# Patient Record
Sex: Female | Born: 1940 | Race: White | Hispanic: No | Marital: Married | Smoking: Never smoker
Health system: Southern US, Community
[De-identification: ages and names within clinical notes are randomized; demographics above are authoritative.]

## PROBLEM LIST (undated history)

## (undated) DIAGNOSIS — F329 Major depressive disorder, single episode, unspecified: Secondary | ICD-10-CM

## (undated) DIAGNOSIS — Z9889 Other specified postprocedural states: Secondary | ICD-10-CM

## (undated) DIAGNOSIS — F419 Anxiety disorder, unspecified: Secondary | ICD-10-CM

## (undated) DIAGNOSIS — K5790 Diverticulosis of intestine, part unspecified, without perforation or abscess without bleeding: Secondary | ICD-10-CM

## (undated) DIAGNOSIS — M549 Dorsalgia, unspecified: Secondary | ICD-10-CM

## (undated) DIAGNOSIS — M797 Fibromyalgia: Secondary | ICD-10-CM

## (undated) DIAGNOSIS — R739 Hyperglycemia, unspecified: Secondary | ICD-10-CM

## (undated) DIAGNOSIS — R519 Headache, unspecified: Secondary | ICD-10-CM

## (undated) DIAGNOSIS — K760 Fatty (change of) liver, not elsewhere classified: Secondary | ICD-10-CM

## (undated) DIAGNOSIS — E039 Hypothyroidism, unspecified: Secondary | ICD-10-CM

## (undated) DIAGNOSIS — I10 Essential (primary) hypertension: Secondary | ICD-10-CM

## (undated) DIAGNOSIS — R7303 Prediabetes: Secondary | ICD-10-CM

## (undated) DIAGNOSIS — F32A Depression, unspecified: Secondary | ICD-10-CM

## (undated) DIAGNOSIS — G8929 Other chronic pain: Secondary | ICD-10-CM

## (undated) DIAGNOSIS — R51 Headache: Secondary | ICD-10-CM

## (undated) DIAGNOSIS — K219 Gastro-esophageal reflux disease without esophagitis: Secondary | ICD-10-CM

## (undated) DIAGNOSIS — M1990 Unspecified osteoarthritis, unspecified site: Secondary | ICD-10-CM

## (undated) HISTORY — PX: BREAST BIOPSY: SHX20

## (undated) HISTORY — DX: Other specified postprocedural states: Z98.890

## (undated) HISTORY — DX: Diverticulosis of intestine, part unspecified, without perforation or abscess without bleeding: K57.90

## (undated) HISTORY — DX: Other chronic pain: G89.29

## (undated) HISTORY — DX: Fibromyalgia: M79.7

## (undated) HISTORY — DX: Hyperglycemia, unspecified: R73.9

## (undated) HISTORY — PX: APPENDECTOMY: SHX54

## (undated) HISTORY — PX: OTHER SURGICAL HISTORY: SHX169

## (undated) HISTORY — PX: TOTAL ABDOMINAL HYSTERECTOMY W/ BILATERAL SALPINGOOPHORECTOMY: SHX83

## (undated) HISTORY — DX: Hypothyroidism, unspecified: E03.9

## (undated) HISTORY — DX: Gastro-esophageal reflux disease without esophagitis: K21.9

## (undated) HISTORY — PX: LUMBAR DISC SURGERY: SHX700

## (undated) HISTORY — DX: Fatty (change of) liver, not elsewhere classified: K76.0

## (undated) HISTORY — DX: Dorsalgia, unspecified: M54.9

---

## 1996-03-23 HISTORY — PX: CHOLECYSTECTOMY: SHX55

## 1999-12-16 ENCOUNTER — Other Ambulatory Visit: Admission: RE | Admit: 1999-12-16 | Discharge: 1999-12-16 | Payer: Self-pay | Admitting: Gynecology

## 2001-04-11 ENCOUNTER — Encounter: Payer: Self-pay | Admitting: Rheumatology

## 2001-04-11 ENCOUNTER — Ambulatory Visit (HOSPITAL_COMMUNITY): Admission: RE | Admit: 2001-04-11 | Discharge: 2001-04-11 | Payer: Self-pay | Admitting: Rheumatology

## 2002-04-27 ENCOUNTER — Other Ambulatory Visit: Admission: RE | Admit: 2002-04-27 | Discharge: 2002-04-27 | Payer: Self-pay | Admitting: Gynecology

## 2003-05-19 ENCOUNTER — Encounter: Admission: RE | Admit: 2003-05-19 | Discharge: 2003-05-19 | Payer: Self-pay | Admitting: Neurology

## 2004-02-25 ENCOUNTER — Ambulatory Visit (HOSPITAL_COMMUNITY): Admission: RE | Admit: 2004-02-25 | Discharge: 2004-02-25 | Payer: Self-pay | Admitting: General Surgery

## 2004-03-23 DIAGNOSIS — Z9889 Other specified postprocedural states: Secondary | ICD-10-CM

## 2004-03-23 DIAGNOSIS — K219 Gastro-esophageal reflux disease without esophagitis: Secondary | ICD-10-CM

## 2004-03-23 HISTORY — DX: Gastro-esophageal reflux disease without esophagitis: K21.9

## 2004-03-23 HISTORY — DX: Other specified postprocedural states: Z98.890

## 2004-04-22 ENCOUNTER — Encounter (HOSPITAL_COMMUNITY): Admission: RE | Admit: 2004-04-22 | Discharge: 2004-05-22 | Payer: Self-pay | Admitting: Neurology

## 2004-05-27 ENCOUNTER — Encounter (HOSPITAL_COMMUNITY): Admission: RE | Admit: 2004-05-27 | Discharge: 2004-06-26 | Payer: Self-pay | Admitting: Neurology

## 2004-12-05 ENCOUNTER — Encounter (HOSPITAL_COMMUNITY): Admission: RE | Admit: 2004-12-05 | Discharge: 2004-12-19 | Payer: Self-pay | Admitting: Neurology

## 2006-06-27 ENCOUNTER — Emergency Department (HOSPITAL_COMMUNITY): Admission: EM | Admit: 2006-06-27 | Discharge: 2006-06-28 | Payer: Self-pay | Admitting: Emergency Medicine

## 2006-06-28 ENCOUNTER — Ambulatory Visit (HOSPITAL_COMMUNITY): Admission: RE | Admit: 2006-06-28 | Discharge: 2006-06-28 | Payer: Self-pay | Admitting: Family Medicine

## 2006-07-08 ENCOUNTER — Ambulatory Visit (HOSPITAL_COMMUNITY): Admission: RE | Admit: 2006-07-08 | Discharge: 2006-07-08 | Payer: Self-pay | Admitting: Neurology

## 2006-08-09 ENCOUNTER — Encounter: Admission: RE | Admit: 2006-08-09 | Discharge: 2006-08-09 | Payer: Self-pay | Admitting: Neurology

## 2006-12-06 ENCOUNTER — Ambulatory Visit (HOSPITAL_COMMUNITY): Admission: RE | Admit: 2006-12-06 | Discharge: 2006-12-06 | Payer: Self-pay | Admitting: Family Medicine

## 2007-02-03 ENCOUNTER — Ambulatory Visit: Payer: Self-pay | Admitting: Internal Medicine

## 2007-02-10 ENCOUNTER — Ambulatory Visit (HOSPITAL_COMMUNITY): Admission: RE | Admit: 2007-02-10 | Discharge: 2007-02-10 | Payer: Self-pay | Admitting: Internal Medicine

## 2007-03-31 ENCOUNTER — Ambulatory Visit: Payer: Self-pay | Admitting: Internal Medicine

## 2007-09-30 ENCOUNTER — Ambulatory Visit: Payer: Self-pay | Admitting: Gastroenterology

## 2007-12-09 ENCOUNTER — Ambulatory Visit (HOSPITAL_COMMUNITY): Admission: RE | Admit: 2007-12-09 | Discharge: 2007-12-09 | Payer: Self-pay | Admitting: Family Medicine

## 2008-02-24 ENCOUNTER — Other Ambulatory Visit: Payer: Self-pay | Admitting: Emergency Medicine

## 2008-02-25 ENCOUNTER — Ambulatory Visit: Payer: Self-pay | Admitting: *Deleted

## 2008-02-25 ENCOUNTER — Inpatient Hospital Stay (HOSPITAL_COMMUNITY): Admission: EM | Admit: 2008-02-25 | Discharge: 2008-02-25 | Payer: Self-pay | Admitting: *Deleted

## 2008-03-07 ENCOUNTER — Ambulatory Visit (HOSPITAL_COMMUNITY): Payer: Self-pay | Admitting: Psychiatry

## 2008-03-26 ENCOUNTER — Ambulatory Visit (HOSPITAL_COMMUNITY): Payer: Self-pay | Admitting: Psychiatry

## 2008-03-26 ENCOUNTER — Encounter: Payer: Self-pay | Admitting: Urgent Care

## 2008-03-26 LAB — CONVERTED CEMR LAB
ALT: 30 units/L (ref 0–35)
AST: 35 units/L (ref 0–37)
Albumin: 4.3 g/dL (ref 3.5–5.2)
Alkaline Phosphatase: 74 units/L (ref 39–117)
Bilirubin, Direct: <0.1 mg/dL (ref 0.0–0.3)
Total Bilirubin: 0.4 mg/dL (ref 0.3–1.2)
Total Protein: 7.8 g/dL (ref 6.0–8.3)

## 2008-04-09 ENCOUNTER — Ambulatory Visit (HOSPITAL_COMMUNITY): Payer: Self-pay | Admitting: Psychiatry

## 2008-05-04 ENCOUNTER — Ambulatory Visit (HOSPITAL_COMMUNITY): Payer: Self-pay | Admitting: Psychiatry

## 2008-05-22 ENCOUNTER — Ambulatory Visit (HOSPITAL_COMMUNITY): Payer: Self-pay | Admitting: Psychiatry

## 2008-05-29 ENCOUNTER — Ambulatory Visit (HOSPITAL_COMMUNITY): Payer: Self-pay | Admitting: Psychiatry

## 2008-06-25 ENCOUNTER — Ambulatory Visit (HOSPITAL_COMMUNITY): Payer: Self-pay | Admitting: Psychiatry

## 2008-07-23 ENCOUNTER — Ambulatory Visit (HOSPITAL_COMMUNITY): Payer: Self-pay | Admitting: Psychiatry

## 2008-11-15 DIAGNOSIS — E039 Hypothyroidism, unspecified: Secondary | ICD-10-CM | POA: Insufficient documentation

## 2008-11-15 DIAGNOSIS — M797 Fibromyalgia: Secondary | ICD-10-CM | POA: Insufficient documentation

## 2008-11-15 DIAGNOSIS — R5382 Chronic fatigue, unspecified: Secondary | ICD-10-CM | POA: Insufficient documentation

## 2008-11-15 DIAGNOSIS — Z8719 Personal history of other diseases of the digestive system: Secondary | ICD-10-CM | POA: Insufficient documentation

## 2008-11-15 DIAGNOSIS — E669 Obesity, unspecified: Secondary | ICD-10-CM | POA: Insufficient documentation

## 2008-11-16 ENCOUNTER — Ambulatory Visit: Payer: Self-pay | Admitting: Internal Medicine

## 2008-11-19 DIAGNOSIS — K7689 Other specified diseases of liver: Secondary | ICD-10-CM | POA: Insufficient documentation

## 2008-11-19 DIAGNOSIS — K219 Gastro-esophageal reflux disease without esophagitis: Secondary | ICD-10-CM | POA: Insufficient documentation

## 2008-12-10 ENCOUNTER — Ambulatory Visit (HOSPITAL_COMMUNITY): Admission: RE | Admit: 2008-12-10 | Discharge: 2008-12-10 | Payer: Self-pay | Admitting: Family Medicine

## 2009-01-15 ENCOUNTER — Encounter: Payer: Self-pay | Admitting: Internal Medicine

## 2009-02-07 ENCOUNTER — Ambulatory Visit (HOSPITAL_COMMUNITY): Admission: RE | Admit: 2009-02-07 | Discharge: 2009-02-07 | Payer: Self-pay | Admitting: Internal Medicine

## 2009-12-26 ENCOUNTER — Ambulatory Visit (HOSPITAL_COMMUNITY): Admission: RE | Admit: 2009-12-26 | Discharge: 2009-12-26 | Payer: Self-pay | Admitting: Family Medicine

## 2010-04-22 NOTE — Miscellaneous (Signed)
Summary: Orders Update  Clinical Lists Changes  Orders: Added new Test order of T-Hepatic Function [id number]) - Signed  Appended Document: Orders Update Lab order mailed to pt.

## 2010-04-22 NOTE — Assessment & Plan Note (Signed)
Summary: ANNUAL F/U OV IN JULY 2010,STEATOHEPATITIS/AMS   Visit Type:  Follow-up Visit Primary Care [name]:  golding  Chief Complaint:  yearly follow up.  History of Present Illness: Followup elevated transaminases fatty-appearing liver on ultrasound. History gastroesophageal reflux disease. Last seen here in July 2009 which time she was doing very well her LFTs have subsequently normalized the setting of significant intentional weight loss she would 186 pounds in 2008, 171 pounds in 2009 and today she weighs 156 pounds.  We noted she underwent an EGD and colonoscopy Dr. Lovell Sheehan in 2006 without significant findings. She is due for routine screening in 2016  She does not consume alcohol interestingly, she has a history of a positive hepatitis C antibody as does her daughter however [name] has a negative PCR. She has not had any intercurrent medical illnesses since last visiting the clinic. Hepatic profile from March 27, 2000 was completely normal. She relates to not eating very much aerobic exercise these days because of fibromyalgia.  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 At Bedtime 2)  Aspirin 81 Mg Tabs (Aspirin) .... Once Daily 3)  Lyrica 200 Mg Caps (Pregabalin) .... Three Times A Day 4)   Citalopram Hydrobromide 40 Mg Tabs (Citalopram Hydrobromide) .... Once Daily 5)  Hydrochlorothiazide 25 Mg Tabs (Hydrochlorothiazide) .... Once Daily 6)  Levoxyl 88 Mcg Tabs (Levothyroxine Sodium) .... Once Daily 7)  Centrum Silver  Tabs (Multiple Vitamins-Minerals) .... Once Daily 8)  Lidoderm Patches .... As Needed 9)  Calcium 600 + D .... Once Daily 10)  Prilosec Otc 20 Mg Tbec (Omeprazole Magnesium) .... As Directed 11)  Wellbutrin Xl 150 Mg Xr24h-Tab (Bupropion Hcl) .... Once Daily  Allergies (verified): 1)  ! Sulfa  Past History:  Family History: Last updated: 28-Nov-2008 Father: deceased cancer Mother:deceased cva  Siblings: 3 brothers deceased, 1 living brother,2 sisters No FH of Colon Cancer:  Social History: Last updated: 11/28/2008 Marital Status: Married Children: 3 Occupation: retired Patient has never smoked.  Alcohol Use - no  Risk Factors: Smoking Status: never (November 28, 2008)  Past Medical History: negative colonoscopy and EGD by Dr. Lovell Sheehan- 2006  Past Surgical History: HYSTERECTOMY CHOLECYSTECTOMY 1998 POSTPARTUM HEMORRHAGE,REQUIRING TRANSFUSION HISTORY OF DISK SURGERY HISTORY OF BILATERL CATARACTS APPENDECTOMY  Family History: Father: deceased cancer Mother:deceased cva  Siblings: 3 brothers deceased, 1 living brother,2 sisters No FH of Colon Cancer:  Social History: Marital Status: Married Children: 3 Occupation: retired Patient has never smoked.  Alcohol Use - no Smoking Status:  never  Vital Signs:  Patient profile:   70 year old female Height:      63 inches Weight:      156 pounds BMI:     27.73 Temp:     98.3 degrees F oral Pulse rate:   76 / minute BP sitting:   112 /  72  (left arm) Cuff size:   regular  Vitals Entered By: Hendricks Limes LPN (November 16, 2008 2:39 PM)  Physical Exam  General:  alert conversant no acute distress Eyes:  no sclera icterus conjunctiva P. Abdomen:  nondistended positive bowel sounds soft and  nontender appreciable mass or hepatosplenomegaly  Impression & Recommendations: History of a fatty-appearing liver on ultrasound with mild transaminitis in the past with intentional weight loss her transaminases have normalized. I suspect she has not noticed any liver disease which is becoming quiscent sent with normalization with weight loss.. In addition, her gastroesophageal reflux disease symptoms are now well controlled on Prilosec and I suspect weight loss no doubt has also contributed to improvement in his disease as well.    Recommendations: Continue weight loss/diet aerobic exercise 30 minutes 3 times weekly to optimize her regimen recommended. We'll repeat her hepatic profile and next couple months in conjunction with Dr. Beatrice Lecher requested labs. If her hepatic profile remains normal, would recommend no further specific GI evaluation.  I'll be happy to see her back on a p.r.n. basis.  Appended Document: Orders Update-charge    Clinical Lists Changes  Problems: Added new problem of GERD (ICD-530.81) Added new problem of FATTY LIVER DISEASE (ICD-571.8) Orders: Added new Service order of Est. Patient Level IV (04540) - Signed

## 2010-07-31 ENCOUNTER — Ambulatory Visit (INDEPENDENT_AMBULATORY_CARE_PROVIDER_SITE_OTHER): Payer: Medicare Other | Admitting: Urgent Care

## 2010-07-31 ENCOUNTER — Encounter: Payer: Self-pay | Admitting: Urgent Care

## 2010-07-31 ENCOUNTER — Ambulatory Visit (HOSPITAL_COMMUNITY)
Admission: RE | Admit: 2010-07-31 | Discharge: 2010-07-31 | Disposition: A | Discharge status: Home / Self Care | Payer: Medicare Other | Source: Ambulatory Visit | Attending: Urgent Care | Admitting: Urgent Care

## 2010-07-31 DIAGNOSIS — K219 Gastro-esophageal reflux disease without esophagitis: Secondary | ICD-10-CM

## 2010-07-31 DIAGNOSIS — R109 Unspecified abdominal pain: Secondary | ICD-10-CM | POA: Insufficient documentation

## 2010-07-31 DIAGNOSIS — R197 Diarrhea, unspecified: Secondary | ICD-10-CM

## 2010-07-31 DIAGNOSIS — K5730 Diverticulosis of large intestine without perforation or abscess without bleeding: Secondary | ICD-10-CM | POA: Insufficient documentation

## 2010-07-31 DIAGNOSIS — K7689 Other specified diseases of liver: Secondary | ICD-10-CM | POA: Insufficient documentation

## 2010-07-31 LAB — COMPREHENSIVE METABOLIC PANEL
ALT: 52 U/L — ABNORMAL HIGH (ref 0–35)
AST: 52 U/L — ABNORMAL HIGH (ref 0–37)
Albumin: 4.1 g/dL (ref 3.5–5.2)
Alkaline Phosphatase: 66 U/L (ref 39–117)
BUN: 14 mg/dL (ref 6–23)
CO2: 33 mEq/L — ABNORMAL HIGH (ref 19–32)
Calcium: 10.6 mg/dL — ABNORMAL HIGH (ref 8.4–10.5)
Chloride: 98 mEq/L (ref 96–112)
Creat: 0.62 mg/dL (ref 0.40–1.20)
Glucose, Bld: 94 mg/dL (ref 70–99)
Potassium: 4.6 mEq/L (ref 3.5–5.3)
Sodium: 137 mEq/L (ref 135–145)
Total Bilirubin: 0.3 mg/dL (ref 0.3–1.2)
Total Protein: 7.7 g/dL (ref 6.0–8.3)

## 2010-07-31 LAB — CBC WITH DIFFERENTIAL/PLATELET
Basophils Absolute: 0 10*3/uL (ref 0.0–0.1)
Basophils Relative: 0 % (ref 0–1)
Eosinophils Absolute: 0 10*3/uL (ref 0.0–0.7)
Eosinophils Relative: 0 % (ref 0–5)
HCT: 40.5 % (ref 36.0–46.0)
Hemoglobin: 14.1 g/dL (ref 12.0–15.0)
Lymphocytes Relative: 43 % (ref 12–46)
Lymphs Abs: 3.4 10*3/uL (ref 0.7–4.0)
MCH: 32.9 pg (ref 26.0–34.0)
MCHC: 34.8 g/dL (ref 30.0–36.0)
MCV: 94.4 fL (ref 78.0–100.0)
Monocytes Absolute: 0.7 10*3/uL (ref 0.1–1.0)
Monocytes Relative: 9 % (ref 3–12)
Neutro Abs: 3.8 10*3/uL (ref 1.7–7.7)
Neutrophils Relative %: 48 % (ref 43–77)
Platelets: 317 10*3/uL (ref 150–400)
RBC: 4.29 MIL/uL (ref 3.87–5.11)
RDW: 12.8 % (ref 11.5–15.5)
WBC: 7.9 10*3/uL (ref 4.0–10.5)

## 2010-07-31 LAB — LIPASE: Lipase: 39 U/L (ref 11–59)

## 2010-07-31 MED ORDER — IOHEXOL 300 MG/ML  SOLN
100.0000 mL | Freq: Once | INTRAMUSCULAR | Status: AC | PRN
  Administered 2010-07-31: 100 mL via INTRAVENOUS

## 2010-07-31 NOTE — Progress Notes (Signed)
Primary Care Physician:  Colette Ribas, MD Primary Gastroenterologist:  Dr. Jena Gauss  Chief Complaint  Patient presents with  . Abdominal Pain    for week very tender    HPI:  [name] is a 70 y.o. female here for evaluation of abdominal pain & diarrhea.  C/o six week hx of upper abd pain, radiates to lower abd.  Pain waxes & wanes.  C/o constant soreness.  Intermittent diarrhea 3/7 days per week.  3 stools/day sometimes watery.  Denies rectal bleeding or melena.   Problems started after antibiotics for UTI 1st March.  Eating yogurt.  Denies fever or chills.  C/o nausea or vomiting.  Taking prilosec 20mg  daily, but having regurgitation & water brash at night.  C/o indigestion, burning & aching pain.  Not associated w/ certain foods.  Pain 5/6.  Tried lactaid, no help.  Avoiding dairy.  Denies foreign travel.  Well water.  No ill contacts.  Denies any dysphagia or odynophagia.  Rare aleve.  Worse w/ lying supine.  Worse after eating.    Past Medical History  Diagnosis Date  . Hypothyroid   . Hyperglycemia   . Diverticulosis   . GERD (gastroesophageal reflux disease) 2006    EGD by Dr. Lovell Sheehan  . Fibromyalgia   . S/P colonoscopy 2006    Dr. Lovell Sheehan  . Fatty liver   . Hepatitis C antibody test positive     negative pcr  . Chronic back pain   . Hemorrhage after delivery of fetus     Past Surgical History  Procedure Date  . Cholecystectomy 1998  . Total abdominal hysterectomy w/ bilateral salpingoophorectomy   . Cataracts   . Lumbar disc surgery   . Appendectomy     Current Outpatient Prescriptions  Medication Sig Dispense Refill  . aspirin 81 MG tablet Take 81 mg by mouth daily.        . Calcium-Vitamin D-Vitamin K (CALCIUM + D + K PO) Take 500 mg by mouth.        . citalopram (CELEXA) 20 MG tablet Take 20 mg by mouth daily.        . fish oil-omega-3 fatty acids 1000 MG capsule Take 2 g by mouth daily.        . hydrochlorothiazide 25 MG tablet Take 25 mg by mouth daily.         Marland Kitchen levothyroxine (SYNTHROID, LEVOTHROID) 88 MCG tablet Take 88 mcg by mouth daily.        Marland Kitchen lidocaine (LIDODERM) 5 % Place 1 patch onto the skin daily. Remove & Discard patch within 12 hours or as directed by MD       . Milnacipran HCl (SAVELLA) 25 MG TABS Take by mouth.        . Multiple Vitamin (MULTIVITAMIN) capsule Take 1 capsule by mouth daily.        . Nutritional Supplements (ESTROVEN PO) Take by mouth.        Marland Kitchen omeprazole (PRILOSEC) 20 MG capsule Take 20 mg by mouth daily.        . pregabalin (LYRICA) 200 MG capsule Take 200 mg by mouth 2 (two) times daily.        Marland Kitchen zolpidem (AMBIEN CR) 12.5 MG CR tablet Take 12.5 mg by mouth at bedtime as needed.          Allergies as of 07/31/2010 - Review Complete 07/31/2010  Allergen Reaction Noted  . Sulfonamide derivatives      Family History: There is  no known family history of colorectal carcinoma , liver disease, or inflammatory bowel disease.  Problem Relation Age of Onset  . Brain cancer Father   . Coronary artery disease Mother   . Prostate cancer Brother   . Cancer Sister     gyn  . Lung cancer Brother   . Seizures Son   . Pancreatitis Daughter     ?gallstones    History   Social History  . Marital Status: Married    Spouse Name: N/A    Number of Children: 3  . Years of Education: N/A   Occupational History  . retired     [employer]   Social History Main Topics  . Smoking status: Never Smoker   . Smokeless tobacco: Not on file  . Alcohol Use: No  . Drug Use: No  . Sexually Active: No   Review of Systems: Gen: some fatigue, anorexia. CV: Denies chest pain, angina, palpitations, syncope, orthopnea, PND, peripheral edema, and claudication. Resp: Denies dyspnea at rest, dyspnea with exercise, cough, sputum, wheezing, coughing up blood, and pleurisy. GI: Denies vomiting blood, jaundice, and fecal incontinence.   Denies dysphagia or odynophagia. GU : Denies urinary burning, blood in urine,  urinary frequency, urinary hesitancy, nocturnal urination, and urinary incontinence. MS: Denies joint pain, limitation of movement, and swelling, stiffness, low back pain, extremity pain. Denies muscle weakness, cramps, atrophy.  Derm: Denies rash, itching, dry skin, hives, moles, warts, or unhealing ulcers.  Psych: Denies depression, anxiety, memory loss, suicidal ideation, hallucinations, paranoia, and confusion. Heme: Denies bruising, bleeding, and enlarged lymph nodes.  Physical Exam: BP 118/80  Pulse 88  Temp(Src) 97.6 F (36.4 C) (Tympanic)  Ht 5\' 2"  (1.575 m)  Wt 167 lb 9.6 oz (76.023 kg)  BMI 30.65 kg/m2 General:   Alert,  Well-developed, well-nourished, pleasant and cooperative in NAD Head:  Normocephalic and atraumatic. Eyes:  Sclera clear, no icterus.   Conjunctiva pink. Ears:  Normal auditory acuity. Nose:  No deformity, discharge,  or lesions. Mouth:  No deformity or lesions, dentition normal. Neck:  Supple; no masses or thyromegaly. Lungs:  Clear throughout to auscultation.   No wheezes, crackles, or rhonchi. No acute distress. Heart:  Regular rate and rhythm; no murmurs, clicks, rubs,  or gallops. Abdomen:  Soft, mildly distended.  Generalized abd pain, most tender to epigastrum, umbilicus & bilat lower quadrants.  No masses, hepatosplenomegaly or hernias noted. Normal bowel sounds, without guarding, and without rebound.   Msk:  Symmetrical without gross deformities. Normal posture. Pulses:  Normal pulses noted. Extremities:  Without clubbing or edema. Neurologic:  Alert and  oriented x4;  grossly normal neurologically. Skin:  Intact without significant lesions or rashes. Cervical Nodes:  No significant cervical adenopathy. Psych:  Alert and cooperative. Normal mood and affect.

## 2010-07-31 NOTE — Assessment & Plan Note (Signed)
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

## 2010-07-31 NOTE — Assessment & Plan Note (Addendum)
6-week history of constant waxing & waning abd pain.  Begins in upper abd, but radiates through entire abd.   She has had diarrhea after antibiotics as well.  Given severity of pain will request CT abd/pelvis w/IV/oral contrast to r/o diverticulitis, colitis, pancreatitis.  Check stool for c diff by pcr, culture STAT labs including CBC, CMP, lipase TO ER if severe pain Offered pain meds, patient declined

## 2010-07-31 NOTE — Progress Notes (Signed)
Cc to PCP 

## 2010-07-31 NOTE — Assessment & Plan Note (Addendum)
Chronic on omeprazole 20mg  daily with breakthrough symptoms especially nocturnal. Increase omeprazole to 20mg  BID Begin ALIGN daily

## 2010-07-31 NOTE — Assessment & Plan Note (Signed)
Given recent antibiotic use, we need to r/o c diff or infectious colitis.

## 2010-07-31 NOTE — Patient Instructions (Addendum)
To ER if severe pain Begin ALIGN daily Increase omeprazole 20mg  to twice per day

## 2010-08-04 ENCOUNTER — Telehealth: Payer: Self-pay | Admitting: Urgent Care

## 2010-08-04 ENCOUNTER — Encounter: Payer: Self-pay | Admitting: Internal Medicine

## 2010-08-04 LAB — CLOSTRIDIUM DIFFICILE BY PCR: Toxigenic C. Difficile by PCR: NOT DETECTED

## 2010-08-04 NOTE — Telephone Encounter (Signed)
A user error has taken place: encounter opened in error, closed for administrative reasons.

## 2010-08-05 LAB — STOOL CULTURE

## 2010-08-05 NOTE — H&P (Signed)
NAMESABINE, TENENBAUM NO.:  [id number]   MEDICAL RECORD NO.:  [id number]          PATIENT TYPE:  IPS   LOCATION:  0302                          FACILITY:  [facility]   PHYSICIAN:  Jasmine Pang, M.D. DATE OF BIRTH:  Oct 20, 1940   DATE OF ADMISSION:  02/25/2008  DATE OF DISCHARGE:                       PSYCHIATRIC ADMISSION ASSESSMENT   This is a voluntary admission to the services of Dr. Milford Cage.   IDENTIFYING INFORMATION:  This is a 70 year old married white female.  Basically, her situation begins approximately 2 weeks ago when her  Cymbalta 60 mg p.o. daily for fibromyalgia was abruptly stopped by her  primary care physician due to excessive perspiration.  This Thursday,  she was keeping her grandchildren, and she and her daughter got into a  disagreement.  They disagreed over some punishment for the grandson.  The daughter came and picked the children up.  The patient was already  feeling depressed due to having had her Cymbalta abruptly stopped.  Her  fibromyalgia has flared.  Her husband is also ill and has had several  things going on.  She said she guessed that it was just the perfect  storm.  She began crying.  On Thursday night, took a number of Ambien in  a suicide attempt.  In the morning, her husband had trouble awakening  her and called 911.  Hence, she presented to [facility].   PAST PSYCHIATRIC HISTORY:  She has none at all.   SOCIAL HISTORY:  She had some college.  She has been married one time  for 46 years.  She has 3 children, a son 27, another son, and her  youngest is a daughter, age 58.  She retired in 2003.  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   FAMILY HISTORY:  She says her mother and her sister both had depression.  She is not sure if they took antidepressants or not.  She denies any  history for alcohol or drugs.   PRIMARY CARE Romelia Bromell:  Dr. Phillips Odor.  She sees Dr. Macarthur Critchley for her  fibromyalgia.  Dr. Greta Doom is her OB/GYN doctor.   MEDICAL PROBLEMS:  1. Fibromyalgia.  2. Hypertension.  3. Hypothyroidism.  4. She is also known to have frequent bladder infections.   MEDICATIONS:  1. She is currently prescribed Lyrica 200 mg p.o. t.i.d.  2. Hydrochlorothiazide 25 mg p.o. daily.  3. Levothyroxine 88 mg p.o. daily.  4. Aspirin 81 mg p.o. daily.  5. Multivitamin 1 tab p.o. daily.  6. Ambien CR 12.5 mg p.o. nightly.  7. Prilosec 20 mg p.o. daily.  8. Caltrate 600 mg plus D and minerals 1 p.o. daily.  9. Lidocaine 1% patch apply for 12 and off for12.   Her urinalysis showed that she has an active UTI.  Cipro was started, a  loading dose of 500 in the ED at [facility].  We will give her Cipro 250  mg p.o. b.i.d. x3 days.  She had no other remarkable lab findings.   She is status post back surgery in 1986, and she  has a surgical scar  midline of her spine that is consistent with this diagnosis.  She is  status post a hysterectomy in 1982.   POSITIVE PHYSICAL FINDINGS:  Her vital signs on admission show she is  62.5 inches tall.  She weighs 161.  Temperature 97.  Blood pressure is  159/84.  Pulse 79.  Respirations 18.   MENTAL STATUS EXAM:  Today she was somewhat drowsy.  She got in rather  late from [facility] and had some difficulty sleeping on our bedding.  She is casually dressed in hospital scrubs.  She appears to be  adequately groomed and nourished.  Her speech was a normal rate, rhythm,  and tone.  Her mood is appropriate to the situation, as is her affect.  Thought processes are clear, rationale, and goal-oriented.  Judgment and  insight are good.  Concentration and memory are good.  Intelligence is  at least average.  She denies being suicidal or homicidal.  She denies  auditory or visual hallucinations.   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   AXIS II:   Deferred.   AXIS III:  1. Hypertension.  2. Hypothyroidism.  3. Fibromyalgia.   AXIS IV:  Problems with primary support group.   AXIS V:  1.   She reported that prior to Cymbalta, she had been taking Celexa and was  agreeable to restarting Celexa.  We will set up a family session with  her husband and daughter, possibly tomorrow, and she may be discharged  as early as tomorrow.      Mickie Leonarda Salon, P.A.-C.      Jasmine Pang, M.D.  Electronically Signed   MD/MEDQ  D:  02/25/2008  T:  02/25/2008  Job:  [id number]

## 2010-08-05 NOTE — Assessment & Plan Note (Signed)
NAMEMAIRANY, BRUNO                 CHART#:  [id number]   DATE:  03/31/2007                       DOB:  06/15/1940   Follow up elevated transaminases, positive hepatitis C ELISA antibody,  negative PCR.   Ms. Fern returns after being seen by [place] in November for transaminitis.  She had a positive hepatitis C and ELISA antibody done through Dr.  Lamar Blinks office.  However, hepatitis C PCR was negative.  She really  has no risk factors for hepatitis C and understands that her daughter  has a positive hepatitis C antibody, but a negative PCR.  Ms. Tison is  significantly over her ideal body weight and has taken our previous  recommendations to heart and has been able to accomplish a 15 pound  weight loss through the holidays.  Her fibromyalgia keeps her from  exercising very much.  More recently from March 03, 2007 her GOT and  GPT were 49 and 45, more or less unchanged from prior assay.  Alkaline  phosphatase, bilirubin, and albumin came back normal.   CURRENT MEDICATIONS:  See updated list.  She stopped taking black  cohoshand manganese supplements.   ALLERGIES:  SULFA.   PHYSICAL EXAMINATION:  She appears in no acute distress.  Weight 171.  Height 5 feet 3 inches.  Temp 98.  BP 194/60, pulse 56.  SKIN:  There is no jaundice.  ABDOMEN:  Somewhat obese, positive bowel sounds.  Soft.  Liver is  palpable 3 fingerbreadths below right costal margin.  Do not appreciate  spleen.   Prior ultrasound demonstrated diffuse coarse echotexture of the liver  consistent with fatty infiltration.   ASSESSMENT:  In all likelihood Ms. Calma has steatohepatitis ascending  out of obesity and inactivity.  Positive hepatitis C ELISA antibody  could be explained in one of two ways.  Firstly, she may have truly been  exposed to hepatitis C along the way and resolved the infection, or more  likely the positive ELISA antibody was a false positive.  At any rate  she does not have hepatitis C.  She was  commented on weight loss,  recommendations of  another 15 pounds of weight loss during the next 6  months would be in her best interests.  I have encouraged aerobic  exercise 30 to 45 minutes 3 times weekly.  Unless something comes up  will plan to do a repeat  hepatic profile and obtain a fasting ferritin, which as far as I can  seen has not been done yet, just prior to her followup appointment with  me in 6 months.       Jonathon Bellows, M.D.  Electronically Signed     RMR/MEDQ  D:  03/31/2007  T:  03/31/2007  Job:  [id number]   cc:   Corrie Mckusick, M.D.

## 2010-08-05 NOTE — Assessment & Plan Note (Signed)
NAMEMarland Kitchen  [name], [name]                 CHART#:  [id number]   DATE:  09/30/2007                       DOB:  09-24-1940   PRIMARY GASTROENTEROLOGIST:  Jonathon Bellows, MD   CHIEF COMPLAINT:  Followup steatohepatitis.   PROBLEM LIST:  1. Steatohepatitis secondary to obesity.  2. Positive hepatitis C antibody with negative viremia.  3. Hypothyroidism.  4. Fibromyalgia.  5. Normal colonoscopy by Dr. Lovell Sheehan approximately 2006 except for      diverticulosis.  6. EGD, 2006, by Dr. Lovell Sheehan, gastritis per patient's report.  7. Status post hysterectomy.  8. Status post cholecystectomy in 1998 with history of cholelithiasis.  9. Postpartum hemorrhage, requiring transfusion.  10.History of disk surgery.  11.History of bilateral cataracts.  12.History of status post appendectomy.   SUBJECTIVE:  The patient is a 70 year old female who presents for  followup steatohepatitis.  She is doing very well.  She continues to be  plagued with problems from her fibromyalgia.  She complains of chronic  fatigue.  She denies any jaundice.  Denies any anorexia.  She has lost  19 pounds in the last year and has done this intentionally and done a  very good job with this.  She has recently had her TSH checked at Dr.  Lamar Blinks office, which was normal.  She denies any abdominal pain.  Denies any nausea and vomiting or jaundice.  LFTs from 09/27/2007 were  completely normal, and she had a ferritin of 16, which is normal.   CURRENT MEDICATIONS:  See the list of 09/30/2007.   ALLERGIES:  Sulfa.   OBJECTIVE:  VITAL SIGNS:  Weight 167 pounds, height 63 inches,  temperature 98 degrees, blood pressure 120/62, and pulse 80.  GENERAL:  She is an overweight Caucasian female who is alert, oriented,  pleasant, cooperative and in no acute distress.  HEENT:  Sclerae clear and nonicteric. Conjunctivae pink. Oropharynx pink  and moist without any lesions.  HEART:  Regular rate and rhythm.  Normal S1 and S2.  ABDOMEN:   Positive bowel sounds x4.  No bruits auscultated.  Soft,  nontender, and nondistended without palpable mass or hepatosplenomegaly.  No rebound, tenderness, or guarding.  Exam is limited given the  patient's body habitus.  EXTREMITIES:  Without clubbing or edema.   ASSESSMENT:  1. Non-etoh Steatohepatitis with normal LFTs.  2. History of positive hepatitis C virus antibody with negative      viremia, suspect clearance versus false positive.   PLAN:  Recheck LFTs in 6 months.       Lorenza Burton, N.P.  Electronically Signed     Kassie Mends, M.D.  Electronically Signed    KJ/MEDQ  D:  09/30/2007  T:  10/01/2007  Job:  [id number]   cc:   Corrie Mckusick, M.D.

## 2010-08-05 NOTE — Assessment & Plan Note (Signed)
[name], [name]                 CHART#:  [id number]   DATE:  02/03/2007                       DOB:  12-Feb-1941   REQUESTING PHYSICIAN:  Dr. Phillips Odor.   REASON FOR CONSULTATION:  Transaminitis.   HPI:  The patient is a 70 year old Caucasian female.  She has history of  fibromyalgia, hypothyroidism, and obesity.  She presented for her annual  physical and was found to have transaminitis.  She had an elevated AST  of 43 and ALT of 44 on 10/30/06.  She had acute hepatitis panel on  12/22/06.  She was found to be positive for hepatitis C antibody.  Her  LFTs had increased to AST of 62 and ALT of 50 at that time.  Her GGT was  normal and other LFTs were normal.  She then had a hepatitis C  quantitative RNA which was negative.  We do have documentation of normal  LFTs 1 year ago.  She has recently started on black cohosh this year.  She also takes coenzyme Q10, manganese, and vitamin B1 but has been on  these for years.  She denies any history of jaundice.  Denies any  history of tattoos or intranasal or intravenous drug use.  She did have  a transfusion 41 years ago when she hemorrhaged after childbirth.  She  has had a hepatitis B vaccine.  She has had a monogamous relationship  with her husband.  Her weight has remained stable.  She does have  chronic fatigue and fibromyalgia.  She has been quite inactive for about  3 years now.  She is on Levoxyl for hypothyroidism which is managed by  Dr. Phillips Odor.  Interestingly, her daughter was tested for hepatitis C and  had a positive antibody without evidence of viremia as well.   PAST MEDICAL AND SURGICAL HISTORY:  1. Fibromyalgia.  2. Hypothyroidism.  3. Urinary incontinence.  4. She had a normal colonoscopy by Dr. Lovell Sheehan within the last 2 to 3      years except for diverticulosis.  She has had an EGD at the same      time which showed gastritis per her report.  5. She is status post hysterectomy.  6. Status post cholecystectomy in 1998  with history of cholelithiasis.  7. She received a blood transfusion 41 years ago after postpartum      hemorrhage.  8. She has had back disk surgery.  9. She has had bilateral cataract repairs.  10.She is status post appendectomy.   CURRENT MEDICATIONS:  1. Detrol LA 4 mg daily.  2. Black cohosh 2 daily.  3. Ambien CR 12.5 mg q.h.s.  4. Aspirin 81 mg daily.  5. Lyrica 200 mg t.i.d.  6. Citalopram 40 mg daily.  7. Aciphex 20 mg daily.  8. Hydrochlorothiazide 25 mg daily.  9. Levoxyl 100 mcg daily.  10.Centrum multivitamin once daily.  11.Manganese 50 mg daily.  12.Vitamin B1 once daily.  13.Coenzyme Q10 1 tablet daily.  14.Lidoderm patches every 12 hours.   ALLERGIES:  SULFA.   FAMILY HISTORY:  Positive for a daughter with positive HCV antibody.  Negative for viremia.  Mother deceased at 13 with history of CVAs and  coronary artery disease and MI.  Father deceased age 4 with brain tumor  and has history of CVA.  She has  2 sisters, one with GYN cancer and 1  brother with history of lung cancer and 3 brothers deceased due to  accidents.   SOCIAL HISTORY:  The patient is married.  She has 3 grown, healthy  children.  She is a retired Diplomatic Services operational officer for [employer]  She  denies any tobacco, alcohol, or drug use.   REVIEW OF SYSTEMS:  See HPI, otherwise musculoskeletal, she does have  chronic whole body pain including joints and back pain.  She has history  of fibromyalgia, being followed by Dr. Phillips Odor, otherwise negative  review of systems.   PHYSICAL EXAM:  VITAL SIGNS:  Weight 186 pounds.  Height 62 inches.  Temp 98.4.  Blood pressure 122/82.  Pulse 88.  GENERAL:  The patient is an obese, Caucasian female who is alert,  oriented, pleasant, cooperative, and in no acute distress.  HEENT:  Pupils equal.  Sclerae clear.  Nonicteric.  Oropharynx pink and  moist.  She has upper dentures intact.  NECK:  Supple without any mass or thyromegaly.  CHEST:  Heart, regular  rate and rhythm.  Normal S1 and S2.  No murmurs,  clicks, rubs, or gallop.  LUNGS:  Clear to auscultation bilaterally.  ABDOMEN:  Positive bowel sounds x4.  No bruits auscultated.  Soft,  nondistended.  Liver is palpable 3 fingerbreadths below the right costal  margin.  It appears to cross midline and there is palpable fullness to  the epigastrium which I suspect is due to hepatomegaly.  Unable to  palpate splenomegaly.  Exam is limited given patient's body habitus.  EXTREMITIES:  Without clubbing or edema bilaterally.   LABORATORY STUDIES:  From 12/22/06, she had a negative hepatitis B  surface antigen coantibody and antibody IgM.  She had normal LFTs except  for AST of 62, ALT of 50.  She had a normal GGT.  She had TSH of 0.145  which is low.  She had a normal CBC on 10/30/06.  Platelet count was  315.  She had a normal albumin of 4.3 and normal electrolytes.   IMPRESSION:  The patient is a 70 year old Caucasian female with obesity,  fibromyalgia, hypothyroidism, and positive HCV antibody.  There is no  evidence of viremia on quantitative HCV RNA.  I suspect that she may be  one of the 15% that clear hepatitis C virus spontaneously.  Elevation of  her transaminases may be related to steatohepatitis as well.   PLAN:  1. Abdominal ultrasound to evaluate her liver.  2. I would like her to hold black cohosh, vitamin B1, and coenzyme Q10      for 6 weeks.  3. We will recheck LFTs in 6 weeks and have followup appointment with      Dr. Jena Gauss.  4. If LFTs continue to climb, she is going to need liver biopsy.       Lorenza Burton, N.P.  Electronically Signed     R. Roetta Sessions, M.D.  Electronically Signed    KJ/MEDQ  D:  02/03/2007  T:  02/04/2007  Job:  [id number]   cc:   Corrie Mckusick, M.D.

## 2010-08-05 NOTE — Discharge Summary (Signed)
[name], [name] NO.:  [id number]   MEDICAL RECORD NO.:  [id number]          PATIENT TYPE:  IPS   LOCATION:  0302                          FACILITY:  [facility]   PHYSICIAN:  Jasmine Pang, M.D. DATE OF BIRTH:  June 18, 1940   DATE OF ADMISSION:  02/24/2008  DATE OF DISCHARGE:  02/25/2008                               DISCHARGE SUMMARY   IDENTIFICATION:  This is a 70 year old married white female who was  admitted on a voluntary basis on February 24, 2008.   HISTORY OF PRESENT ILLNESS:  The patient's situation began approximately  2 weeks ago when her Cymbalta 60 mg daily for fibromyalgia was abruptly  stopped by her primary care physician due to excessive perspiration.  This Thursday, she was keeping her grandchildren and her daughter got  into a disagreement.  They had disagreed over some punishment for the  grandson.  The daughter came and picked the children.  The patient was  already feeling depressed due to having her Cymbalta abruptly stopped.  Her fibromyalgia had flared.  Her husband is also ill and she is at the  other stressors as well.  She said she had guessed it was just the  perfect storm.  She began crying.  On Thursday night, she took a number  of Ambien in a suicide attempt.  In the following morning on February 24, 2008, her husband had trouble wakening her and called 911 and she  presented to the [facility].   PAST PSYCHIATRIC HISTORY:  Patient has had none at all.   FAMILY HISTORY:  The patient says her mother and sister both had  depression.  She is not sure if they took antidepressants or not.   ALCOHOL AND DRUG HISTORY:  The patient denies.   MEDICAL PROBLEMS:  Fibromyalgia, hypertension, hypothyroidism, frequent  bladder infections.   MEDICATIONS:  Patient is currently prescribed:  1. Lyrica 200 mg p.o. t.i.d.  2. Hydrochlorothiazide 25 mg p.o. daily.  3. Levothyroxine 88 mcg daily.  4. Aspirin 81 mg p.o. daily.  5.  Multivitamin 1 tablet p.o. daily.  6. Ambien CR 12.5 mg p.o. daily.  7. Prilosec 20 mg daily.  8. Caltrate 600 mg plus D and minerals 1 p.o. daily.  9. Lidocaine 1% patch apply 12 hours on and 12 hours off.   Her urinalysis showed that she had an active UTI.  Cipro was started  with a loading dose of 500 mg in the ED at [facility].  We prescribed  Cipro 250 mg p.o. b.i.d. x3 days.   PHYSICAL FINDINGS:  There were no acute physical or medical problems  noted.   ADMISSION LABORATORIES:  As indicated above.  Her urinalysis showed that  she had an active UTI.  She had no other remarkable lab findings.   HOSPITAL COURSE:  Upon admission, the patient was continued on her home  medications of Lyrica 200 mg p.o. t.i.d., hydrochlorothiazide 25 mg  daily, levothyroxine 88 mcg daily, aspirin 81 mg daily, multivitamin 1  tablet p.o. daily, Ambien CR 12.5 mg p.o. q.h.s., Prilosec 20 mg  daily,  Caltrate 600 plus D 1 p.o. daily, lidocaine 10% three patches every 12  hours p.r.n.  She was also started on Cipro 250 mg p.o. b.i.d. x3 days  after having received a loading dose of 500 mg on February 24, 2008, in  the [facility] ED.  Finally, she was started on Celexa 20 mg daily for  depression.  It was felt the patient had done an overreaction to the  argument with her daughter due to the discontinuation of Cymbalta and  her increased depression.  She had a family session with her daughter.  The patient, the patient's husband, the patient's daughter, the  patient's son attended.  Main concerns addressed for discharge planning  and discussed continuing care for the patient upon discharge.  The  patient discussed feeling better and stated that she could not believe  she tried to harm herself.  The patient's family spoke about the patient  having recent stress due to holiday casts (decorating for Christmas) and  due to recently having stopped her medications.  The patient agreed and  asked for help.  She  named her husband.  Her husband, her doctors, and  her two sons that she can call if she feels like harming herself.  Family agreed to this border and relieved stress with helping decorating  and help with the patient's husband's medical condition.  The family  stated they wanted the patient to come home because she has a special  bed and bathtub to help her with the fibromyalgia.  The patient agreed  stating that she was uncomfortable in the hospital bed and was feeling  tired.  She had an appointment with her pain management, Dr. Vivi Barrack on  December 9 and was referred to the mental health office in [place]  for individual therapy and med management.  The patient stated she was  not feeling suicidal.  She was not homicidal.  Her mood had improved  dramatically, especially after the family session had gone well.  She  had no auditory or visual hallucinations.  No paranoia or delusions.  Thoughts were logical and goal directed.  Thought content, no  predominant theme.  Cognitive was grossly intact.  Insight good.  Judgment good.  Impulse control good.  It was felt the patient was safe  for discharge home with her supportive family.   DISCHARGE DIAGNOSES:  Axis I:.  Depressive disorder, not otherwise  specified.  Axis II:  None.  Axis III:  Hypertension, hypothyroidism, fibromyalgia.  Axis IV:  Severe (problems with primary support group, husband's  illness, burden of getting ready for Christmas, burden of psychiatric  illness, burden of medical problems).  Axis V:  Global assessment of functioning was 55 at discharge.  GAF was  45 upon admission.  GAF highest past year 75-80.   DISCHARGE PLAN:  There was no specific activity level or dietary  restrictions.   POSTHOSPITAL CARE PLANS:  The patient will see Dr. Lolly Mustache at the  [facility] on March 2 at 10 o'clock  a.m.  She will see Florencia Reasons on March 07, 2008, at 11 o'clock a.m.  She will also see  Dr. Vivi Barrack, her pain management doctor on February 29, 2008.   DISCHARGE MEDICATIONS:  1. Celexa 20 mg daily.  2. Lyrica 200 mg 3 times daily.  3. Hydrochlorothiazide 25 mg daily.  4. levothyroxine 80 mcg daily.  5. Aspirin 81 mg daily.  6. Multivitamins daily.  7. Ambien  CR 12.5 mg p.o. q.h.s.  8. Prilosec 20 mg daily.  9. Caltrate as directed.  10.Lidocaine 10% as directed.      Jasmine Pang, M.D.  Electronically Signed     BHS/MEDQ  D:  03/17/2008  T:  03/18/2008  Job:  [id number]

## 2010-08-08 NOTE — H&P (Signed)
NAMECLARABEL, MARION NO.:  [id number]   MEDICAL RECORD NO.:  [id number]          PATIENT TYPE:   LOCATION:                                 FACILITY:   PHYSICIAN:  Dalia Heading, M.D.  DATE OF BIRTH:  April 07, 1940   DATE OF ADMISSION:  02/25/2004  DATE OF DISCHARGE:  LH                                HISTORY & PHYSICAL   CHIEF COMPLAINT:  Need for screening colonoscopy, dysphagia.   HISTORY OF PRESENT ILLNESS:  The patient is a 70 year old white female who  was referred for endoscopic evaluation.  She needs a colonoscopy for  screening purposes.  No abdominal pain, weight loss, nausea, vomiting,  diarrhea, constipation, melena, or hematochezia have been noted.  She has  never had a colonoscopy.  She is also having some dysphagia with difficulty  swallowing.  No indigestion or history of peptic ulcer disease is noted.  No  aspirin use is noted.  There is no family history of colon carcinoma.   PAST MEDICAL HISTORY:  1.  Hypothyroidism.  2.  Fibromyalgia.  3.  Prolapsing bladder.   CURRENT MEDICATIONS:  Levoxyl, Premarin, Ambien, Neurontin, Celexa,  diuretic, Lidoderm patches, baby aspirin.   ALLERGIES:  SULFA DRUGS.   REVIEW OF SYSTEMS:  As noted above.  The patient denies drinking or smoking.   PHYSICAL EXAMINATION:  GENERAL:  The patient is a well-developed, well-  nourished white female in no acute distress.  VITAL SIGNS:  She is afebrile, and vital signs are stable.  CHEST:  Lungs clear to auscultation with equal breath sounds bilaterally.  CARDIAC:  Heart exam reveals a regular rate and rhythm without S3, S4, or  murmurs.  ABDOMEN:  Soft, nontender, nondistended, no hepatosplenomegaly or masses are  noted.  RECTAL:  Examination deferred to the procedure.   IMPRESSION:  Need for screening colonoscopy, dysphagia.   PLAN:  The patient is scheduled for an EGD and colonoscopy on February 25, 2004.  The risks and benefits of the procedure, including  bleeding and  perforation, were fully explained to the patient, who gave informed consent.     Mark   MAJ/MEDQ  D:  02/21/2004  T:  02/21/2004  Job:  [id number]   cc:   Corrie Mckusick, M.D.  Fax: [phone]   [facility]  Fax: [phone]

## 2010-09-08 ENCOUNTER — Ambulatory Visit: Payer: Medicare Other | Admitting: Urgent Care

## 2010-09-11 ENCOUNTER — Ambulatory Visit (INDEPENDENT_AMBULATORY_CARE_PROVIDER_SITE_OTHER): Payer: Medicare Other | Admitting: Urgent Care

## 2010-09-11 ENCOUNTER — Encounter: Payer: Self-pay | Admitting: Urgent Care

## 2010-09-11 DIAGNOSIS — K7689 Other specified diseases of liver: Secondary | ICD-10-CM

## 2010-09-11 DIAGNOSIS — K219 Gastro-esophageal reflux disease without esophagitis: Secondary | ICD-10-CM

## 2010-09-11 DIAGNOSIS — R197 Diarrhea, unspecified: Secondary | ICD-10-CM

## 2010-09-11 DIAGNOSIS — R109 Unspecified abdominal pain: Secondary | ICD-10-CM

## 2010-09-11 NOTE — Patient Instructions (Signed)
Align 1 daily Office visit w/ Dr Jena Gauss in 6 mo Call if any problems

## 2010-09-11 NOTE — Assessment & Plan Note (Signed)
Well-controlled on BID PPI.

## 2010-09-11 NOTE — Progress Notes (Signed)
Cc to Dr. Golding 

## 2010-09-11 NOTE — Assessment & Plan Note (Addendum)
Attempting to lose wt, low fat/cholesterol diet.  +HCV ab, negative pcr viral load.  Pt encouraged.  Goal wt:  135#   Align 1 daily Office visit w/ Dr Jena Gauss in 6 mo Call if any problems

## 2010-09-11 NOTE — Assessment & Plan Note (Addendum)
Resolved.  Suspect secondary to recent UTI or gastroenteritis.  CT reassuring.

## 2010-09-11 NOTE — Progress Notes (Signed)
Primary Care Physician:  Colette Ribas, MD Primary Gastroenterologist:  Dr. Jena Gauss  Chief Complaint  Patient presents with  . Follow-up    feeling better    HPI:  [name] is a 70 y.o. female here for follow up for abd pain, GERD & fatty liver.  She has had a recent benign CT that showed diverticulosis but nothing to explain her abd pain.  It did show fatty liver.  AST 52 & ALT 52, calcium was 10.6, CBC normal.  1967 blood transfusion, no tattoos, no IV drug use, 1 lifetime sexual partner, cholesterol normal. TSH last checked was normal.  Skipping desserts, walking, eating much healthier & has lost several #'s.   Hx of a positive hepatitis C antibody as does her daughter, however [name] has a negative PCR. Her prilosec 20mg  was changed to BID.  Her GERD is now uner good control.  Stool studies were normal including c diff and culture.  She is eating well & has no complaints today.  Interested in losing more weight.   Past Medical History  Diagnosis Date  . Hypothyroid   . Hyperglycemia   . Diverticulosis   . GERD (gastroesophageal reflux disease) 2006    EGD by Dr. Lovell Sheehan  . Fibromyalgia   . S/P colonoscopy 2006    Dr. Lovell Sheehan  . Fatty liver   . Hepatitis C antibody test positive     negative pcr  . Chronic back pain   . Hemorrhage after delivery of fetus    Past Surgical History  Procedure Date  . Cholecystectomy 1998  . Total abdominal hysterectomy w/ bilateral salpingoophorectomy   . Cataracts   . Lumbar disc surgery   . Appendectomy    Current Outpatient Prescriptions  Medication Sig Dispense Refill  . aspirin 81 MG tablet Take 81 mg by mouth daily.        . Calcium-Vitamin D-Vitamin K (CALCIUM + D + K PO) Take 500 mg by mouth.        . citalopram (CELEXA) 20 MG tablet Take 20 mg by mouth daily.        . fish oil-omega-3 fatty acids 1000 MG capsule Take 2 g by mouth daily.        . hydrochlorothiazide 25 MG tablet Take 25 mg by mouth daily.        Marland Kitchen  levothyroxine (SYNTHROID, LEVOTHROID) 88 MCG tablet Take 88 mcg by mouth daily.        Marland Kitchen lidocaine (LIDODERM) 5 % Place 1 patch onto the skin daily. Remove & Discard patch within 12 hours or as directed by MD       . Milnacipran HCl (SAVELLA) 25 MG TABS Take by mouth.        . Multiple Vitamin (MULTIVITAMIN) capsule Take 1 capsule by mouth daily.        . Nutritional Supplements (ESTROVEN PO) Take by mouth.        Marland Kitchen omeprazole (PRILOSEC) 20 MG capsule Take 20 mg by mouth daily.        . pregabalin (LYRICA) 200 MG capsule Take 200 mg by mouth 2 (two) times daily.        Marland Kitchen zolpidem (AMBIEN CR) 12.5 MG CR tablet Take 12.5 mg by mouth at bedtime as needed.         Allergies as of 09/11/2010 - Review Complete 09/11/2010  Allergen Reaction Noted  . Sulfonamide derivatives     Review of Systems: Gen: Denies any fever, chills, sweats,  anorexia, fatigue, weakness, malaise, weight loss, and sleep disorder CV: Denies chest pain, angina, palpitations, syncope, orthopnea, PND, peripheral edema, and claudication. Resp: Denies dyspnea at rest, dyspnea with exercise, cough, sputum, wheezing, coughing up blood, and pleurisy. GI: Denies vomiting blood, jaundice, and fecal incontinence.   Denies dysphagia or odynophagia. Derm: Denies rash, itching, dry skin, hives, moles, warts, or unhealing ulcers.  Psych: Denies depression, anxiety, memory loss, suicidal ideation, hallucinations, paranoia, and confusion. Heme: Denies bruising, bleeding, and enlarged lymph nodes.  Physical Exam: BP 125/78  Pulse 82  Temp(Src) 98 F (36.7 C) (Temporal)  Ht 5\' 2"  (1.575 m)  Wt 160 lb (72.576 kg)  BMI 29.26 kg/m2 General:   Alert,  Well-developed, well-nourished, pleasant and cooperative in NAD Head:  Normocephalic and atraumatic. Eyes:  Sclera clear, no icterus.   Conjunctiva pink. Mouth:  No deformity or lesions, dentition normal. Neck:  Supple; no masses or thyromegaly. Heart:  Regular rate and rhythm; no murmurs,  clicks, rubs,  or gallops. Abdomen:  Soft, nontender and nondistended. No masses, hepatosplenomegaly or hernias noted. Normal bowel sounds, without guarding, and without rebound.   Msk:  Symmetrical without gross deformities. Normal posture. Pulses:  Normal pulses noted. Extremities:  Without clubbing or edema. Neurologic:  Alert and  oriented x4;  grossly normal neurologically. Skin:  Intact without significant lesions or rashes. Cervical Nodes:  No significant cervical adenopathy. Psych:  Alert and cooperative. Normal mood and affect.

## 2010-09-11 NOTE — Assessment & Plan Note (Signed)
Much improved.   Next colonoscopy 2016 or sooner if any problems

## 2010-11-17 ENCOUNTER — Other Ambulatory Visit (HOSPITAL_COMMUNITY): Payer: Self-pay | Admitting: Family Medicine

## 2010-11-17 DIAGNOSIS — Z139 Encounter for screening, unspecified: Secondary | ICD-10-CM

## 2010-12-26 LAB — DIFFERENTIAL
Basophils Absolute: 0 10*3/uL (ref 0.0–0.1)
Basophils Relative: 0 % (ref 0–1)
Eosinophils Absolute: 0.1 10*3/uL (ref 0.0–0.7)
Eosinophils Relative: 1 % (ref 0–5)
Lymphocytes Relative: 21 % (ref 12–46)
Lymphs Abs: 2.7 10*3/uL (ref 0.7–4.0)
Monocytes Absolute: 0.5 10*3/uL (ref 0.1–1.0)
Monocytes Relative: 4 % (ref 3–12)
Neutro Abs: 9.7 10*3/uL — ABNORMAL HIGH (ref 1.7–7.7)
Neutrophils Relative %: 74 % (ref 43–77)

## 2010-12-26 LAB — CBC
HCT: 38.1 % (ref 36.0–46.0)
Hemoglobin: 13.1 g/dL (ref 12.0–15.0)
MCHC: 34.3 g/dL (ref 30.0–36.0)
MCV: 92.5 fL (ref 78.0–100.0)
Platelets: 277 10*3/uL (ref 150–400)
RBC: 4.12 MIL/uL (ref 3.87–5.11)
RDW: 13.3 % (ref 11.5–15.5)
WBC: 13.1 10*3/uL — ABNORMAL HIGH (ref 4.0–10.5)

## 2010-12-26 LAB — BASIC METABOLIC PANEL
BUN: 15 mg/dL (ref 6–23)
CO2: 28 mEq/L (ref 19–32)
Calcium: 8.9 mg/dL (ref 8.4–10.5)
Chloride: 106 mEq/L (ref 96–112)
Creatinine, Ser: 0.81 mg/dL (ref 0.4–1.2)
GFR calc non Af Amer: >60 mL/min (ref >60)
Glucose, Bld: 103 mg/dL — ABNORMAL HIGH (ref 70–99)
Potassium: 4.2 mEq/L (ref 3.5–5.1)
Sodium: 139 mEq/L (ref 135–145)

## 2010-12-26 LAB — URINALYSIS, ROUTINE W REFLEX MICROSCOPIC
Bilirubin Urine: NEGATIVE
Glucose, UA: NEGATIVE mg/dL
Hgb urine dipstick: NEGATIVE
Ketones, ur: NEGATIVE mg/dL
Nitrite: POSITIVE — AB
Protein, ur: NEGATIVE mg/dL
Specific Gravity, Urine: 1.015 (ref 1.005–1.030)
Urobilinogen, UA: 0.2 mg/dL (ref 0.0–1.0)
pH: 7 (ref 5.0–8.0)

## 2010-12-26 LAB — RAPID URINE DRUG SCREEN, HOSP PERFORMED
Amphetamines: NOT DETECTED
Benzodiazepines: NOT DETECTED
Cocaine: NOT DETECTED
Opiates: NOT DETECTED
Tetrahydrocannabinol: NOT DETECTED

## 2010-12-26 LAB — SALICYLATE LEVEL: Salicylate Lvl: <4 mg/dL (ref 2.8–20.0)

## 2010-12-26 LAB — URINE MICROSCOPIC-ADD ON

## 2010-12-26 LAB — ETHANOL

## 2010-12-26 LAB — TSH: TSH: 0.219 u[IU]/mL — ABNORMAL LOW (ref 0.350–4.500)

## 2010-12-26 LAB — ACETAMINOPHEN LEVEL

## 2010-12-29 ENCOUNTER — Ambulatory Visit (HOSPITAL_COMMUNITY)
Admission: RE | Admit: 2010-12-29 | Discharge: 2010-12-29 | Disposition: A | Discharge status: Home / Self Care | Payer: Medicare Other | Source: Ambulatory Visit | Attending: Family Medicine | Admitting: Family Medicine

## 2010-12-29 DIAGNOSIS — Z1231 Encounter for screening mammogram for malignant neoplasm of breast: Secondary | ICD-10-CM | POA: Insufficient documentation

## 2010-12-29 DIAGNOSIS — Z139 Encounter for screening, unspecified: Secondary | ICD-10-CM

## 2011-11-24 ENCOUNTER — Other Ambulatory Visit (HOSPITAL_COMMUNITY): Payer: Self-pay | Admitting: Family Medicine

## 2011-11-24 DIAGNOSIS — Z139 Encounter for screening, unspecified: Secondary | ICD-10-CM

## 2012-01-04 ENCOUNTER — Ambulatory Visit (HOSPITAL_COMMUNITY)
Admission: RE | Admit: 2012-01-04 | Discharge: 2012-01-04 | Disposition: A | Discharge status: Home / Self Care | Payer: Medicare Other | Source: Ambulatory Visit | Attending: Family Medicine | Admitting: Family Medicine

## 2012-01-04 DIAGNOSIS — Z139 Encounter for screening, unspecified: Secondary | ICD-10-CM

## 2012-01-04 DIAGNOSIS — Z1231 Encounter for screening mammogram for malignant neoplasm of breast: Secondary | ICD-10-CM | POA: Insufficient documentation

## 2012-05-25 ENCOUNTER — Ambulatory Visit (HOSPITAL_COMMUNITY)
Admission: RE | Admit: 2012-05-25 | Discharge: 2012-05-25 | Disposition: A | Discharge status: Home / Self Care | Payer: Medicare Other | Source: Ambulatory Visit | Attending: Neurosurgery | Admitting: Neurosurgery

## 2012-05-25 DIAGNOSIS — IMO0001 Reserved for inherently not codable concepts without codable children: Secondary | ICD-10-CM | POA: Insufficient documentation

## 2012-05-25 DIAGNOSIS — M6281 Muscle weakness (generalized): Secondary | ICD-10-CM | POA: Insufficient documentation

## 2012-05-25 DIAGNOSIS — M545 Low back pain, unspecified: Secondary | ICD-10-CM | POA: Insufficient documentation

## 2012-05-25 NOTE — Evaluation (Signed)
Physical Therapy Evaluation  Patient Details  Name: [name] MRN: [id number] Date of Birth: 07-Dec-1940  Today's Date: 05/25/2012 Time: 1440-1535 PT Time Calculation (min): 55 min Charges: 1 eval, 15' Self Care Visit#: 1 of 4  Re-eval: 06/24/12 Assessment Diagnosis: LBP Next MD Visit: Dr. Lovell Sheehan - unscheudled Prior Therapy: Years ago for LBP.  Authorization: [employer] Medicare  Authorization Time Period:    Authorization Visit#: 1 of 4   Past Medical History:  Past Medical History  Diagnosis Date  . Hypothyroid   . Hyperglycemia   . Diverticulosis   . GERD (gastroesophageal reflux disease) 2006    EGD by Dr. Lovell Sheehan  . Fibromyalgia   . S/P colonoscopy 2006    Dr. Lovell Sheehan  . Fatty liver   . Hepatitis C antibody test positive     negative pcr  . Chronic back pain   . Hemorrhage after delivery of fetus    Past Surgical History:  Past Surgical History  Procedure Laterality Date  . Cholecystectomy  1998  . Total abdominal hysterectomy w/ bilateral salpingoophorectomy    . Cataracts    . Lumbar disc surgery    . 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  She has a hx of back surgery in the 1980's.  She reports that she has increased buring pain to her LLE.  She has been diagnosed with fibromyalgia.  In 2011 had 2 spinal injections. 2 months ago she turned around and had a severe back. Her husband has had to help with her bathing due to unable to reach her lower legs.  She used to go to the [facility] and was getting better, but has not attneded since her inital accident.  Her c/co is pain, decreased strength, decreased AROM. She has increased anxitey and stress when she has to do anything "extra" such as attending doctors apts.  Pain Assessment Currently in Pain?: Yes Pain Score:   4 (Range: 4-10/10) Pain Location: Back Pain Orientation: Right;Left Pain  Type: Chronic pain;Acute pain Pain Onset: More than a month ago Pain Relieving Factors: heat Effect of Pain on Daily Activities: unable to go to the [employer], decreases her QOL.   Prior Functioning  Home Living Lives With: Spouse Prior Function Level of Independence: Independent with basic ADLs Able to Take Stairs?: Yes Driving: Yes Comments: She enjoys staying at home and taking care of her husband and grandchildren,  Enjoys cooking and cleaning and goes to the [facility] regularly.   Cognition/Observation Observation/Other Assessments Observations: increased lumbar pain with R and L hip IR  Sensation/Coordination/Flexibility/Functional Tests Coordination Gross Motor Movements are Fluid and Coordinated: No Coordination and Movement Description: impaired coordination to TrA, multifidus and PF musculature. Functional Tests Functional Tests: ODI: 60%  Assessment RLE Strength Right Hip Flexion: 3+/5 Right Hip Extension: 2/5 Right Hip ABduction: 3/5 Right Knee Flexion: 3+/5 Right Knee Extension: 4/5 LLE Strength Left Hip Flexion: 3+/5 Left Hip Extension: 2/5 Left Hip ABduction: 3/5 Left Knee Flexion: 3+/5 Left Knee Extension: 3+/5 Lumbar AROM Lumbar Flexion: Decreased: 50 Lumbar Extension: Decreased 50% Lumbar - Right Side Bend: Decreased 25% - increased pain Lumbar - Left Side Bend: Decreased 10%  Palpation Palpation: min-mod fascial restrictions to R lumbar erector spinae and gluteal region, mod-max restrictions to L gluteal region  Mobility/Balance  Posture/Postural Control Posture/Postural Control: Postural limitations Postural Limitations: Ridgid Static Standing Balance Single Leg Stance - Right Leg: 5  Single Leg Stance - Left Leg: 5 Tandem Stance - Right Leg: 5 Tandem Stance - Left Leg: 10 Rhomberg - Eyes Opened: 10 Rhomberg - Eyes Closed: 10   Exercise/Treatments Hip IR: x15 reps BLE Multifidus: 5x5 sec holds PFC: 3x5 sec holds    Physical Therapy Assessment  and Plan PT Assessment and Plan Clinical Impression Statement: Pt is a 72 year old female referred to for LBP with following deficits listed below.  During evaluation pt has pain provation with hip and lumbar resisted movements and noted difficulty with core activation and coordination.  Discussed with pt benefit of PT and encouraged to continue going to the [facility] and get into water therapy to decrease her pain.  Pt will benefit from skilled therapeutic intervention in order to improve on the following deficits: Decreased coordination;Decreased mobility;Decreased range of motion;Decreased strength;Pain;Improper body mechanics;Impaired perceived functional ability Rehab Potential: Fair Clinical Impairments Affecting Rehab Potential: seocndary to fibromyalgia and pt is agreeable to attending PT 1x/week due to increased stress with extra activities.  PT Frequency: Min 2X/week PT Duration: 4 weeks PT Treatment/Interventions: Therapeutic activities;Therapeutic exercise;Balance training;Neuromuscular re-education;Patient/family education;Manual techniques;Modalities PT Plan: Add Heel and Toe roll in and outs, standing squats, heel and toe raises, core strengthening in supine, S/L and prone.  Keep HEP simple for patient compliance.  COntinue to educate and encourage HEP to decrease chance of re-injury.  Instruct on proper bending and lifting techniques to decrease chance of re-injury.      Goals Home Exercise Program Pt will Perform Home Exercise Program: Independently PT Goal: Perform Home Exercise Program - Progress: Goal set today PT Short Term Goals Time to Complete Short Term Goals: 4 weeks PT Short Term Goal 1: Pt will report pain less than a 3/10 for 75% of her day for improved QOL.  PT Short Term Goal 2: Pt will improve her core coordination in order to tolerate sitting for greater than 60 minutes to continue with her computer activities.  PT Short Term Goal 3: Pt will improve her hip and core  strength to [facility] in order to tolerate standing for greater than 1 hour without increased pain to continue with household activities.  PT Short Term Goal 4: Pt will improve her ODI to less than or equat to 36% for improved QOL.   Problem List Patient Active Problem List  Diagnosis  . HYPOTHYROIDISM  . OBESITY  . GERD  . FATTY LIVER DISEASE  . FIBROMYALGIA  . FATIGUE, CHRONIC  . CHOLELITHIASIS, HX OF  . Abdominal pain  . Diarrhea  . Lumbago    PT Plan of Care PT Home Exercise Plan: see scanned report.  PT Patient Instructions: Importance of posture, core exercises, and HEP.  Consulted and Agree with Plan of Care: Patient  GP Functional Assessment Tool Used: ODI: 60% Functional Limitation: Self care Mobility: Walking and Moving Around Current Status (Z6109): At least 60 percent but less than 80 percent impaired, limited or restricted Self Care Current Status (U0454): At least 60 percent but less than 80 percent impaired, limited or restricted Self Care Goal Status (U9811): At least 20 percent but less than 40 percent impaired, limited or restricted  LISA MASSIE, PT 05/25/2012, 4:11 PM  Physician Documentation Your signature is required to indicate approval of the treatment plan as stated above.  Please sign and either send electronically or make a copy of this report for your files and return this physician signed original.   Please mark one 1.__approve of plan  2. ___approve  of plan with the following conditions.   ______________________________                                                          _____________________ Physician Signature                                                                                                             Date

## 2012-06-02 ENCOUNTER — Ambulatory Visit (HOSPITAL_COMMUNITY)
Admission: RE | Admit: 2012-06-02 | Discharge: 2012-06-02 | Disposition: A | Discharge status: Home / Self Care | Payer: Medicare Other | Source: Ambulatory Visit | Attending: Neurosurgery | Admitting: Neurosurgery

## 2012-06-02 NOTE — Progress Notes (Signed)
Physical Therapy Treatment Patient Details  Name: [name] MRN: [id number] Date of Birth: 1940-05-26  Today's Date: 06/02/2012 Time: 4098-1191 PT Time Calculation (min): 45 min Charges: 25' TE, 10' Manual, 10' Self Care Visit#: 1 of 4  Re-eval: 06/24/12    Authorization: [facility] Medicare  Authorization Time Period:    Authorization Visit#: 1 of 4   Subjective: Symptoms/Limitations Symptoms: Pt reports compliance with HEP.  She states that prone activities are more difficulty and challenging.  Did not sleep well last night.  Pain Assessment Currently in Pain?: Yes Pain Score:   5 Pain Location: Back Pain Orientation: Right;Left  Precautions/Restrictions     Exercise/Treatments Standing Heel Raises: 10 reps Functional Squats: 10 reps;Limitations Functional Squats Limitations: tactile guiding Seated Other Seated Lumbar Exercises: Heel Roll outs 10x10 sec holds Supine Ab Set: 10 reps;Limitations AB Set Limitations: 10 sec holds Clam: 5 reps;Limitations Clam Limitations: w/ab set both  Bridge: 10 reps Isometric Hip Flexion: 5 reps;5 seconds (both) Sidelying Clam: 5 reps (10 second holds) Manual Therapy Manual Therapy: Massage Massage: soft tissue massage to lumbar paraspinals and rt gluteal region to decrease pain and muscle spams x10 minutes   Physical Therapy Assessment and Plan PT Assessment and Plan Clinical Impression Statement: Updated HEP.  has improved coordination to transverse abdominus and max cueing for approprirate pelvic floor function.  Added manual therapy at end of treatment with pt reporting decreased pain at the end..  PT Plan: Continue with core activiites. Keep exercises simple for improved consistancy with HEP.      Goals    Problem List Patient Active Problem List  Diagnosis  . HYPOTHYROIDISM  . OBESITY  . GERD  . FATTY LIVER DISEASE  . FIBROMYALGIA  . FATIGUE, CHRONIC  . CHOLELITHIASIS, HX OF  . Abdominal pain  . Diarrhea  .  Lumbago    PT Plan of Care PT Home Exercise Plan: updated  PT Patient Instructions: Importance of posture, core exercises, and HEP.  Consulted and Agree with Plan of Care: Patient  GP Self Care Current Status [phone]): At least 60 percent but less than 80 percent impaired, limited or restricted  LISA MASSIE, PT 06/02/2012, 2:48 PM

## 2012-06-08 ENCOUNTER — Ambulatory Visit (HOSPITAL_COMMUNITY)
Admission: RE | Admit: 2012-06-08 | Discharge: 2012-06-08 | Disposition: A | Discharge status: Home / Self Care | Payer: Medicare Other | Source: Ambulatory Visit | Attending: Neurosurgery | Admitting: Neurosurgery

## 2012-06-08 NOTE — Progress Notes (Signed)
Physical Therapy Treatment Patient Details  Name: [name] MRN: [id number] Date of Birth: Feb 15, 1941  Today's Date: 06/08/2012 Time: 1191-4782 PT Time Calculation (min): 48 min Charge : Manual 18', therex 30'  Visit#: 2 of 4  Re-eval: 06/24/12    Authorization: [employer] Medicare  Authorization Time Period:    Authorization Visit#: 2 of 4   Subjective: Symptoms/Limitations Symptoms: Pt reported increased LBP due to the weather and fibromyalgia. Pain Assessment Pain Score:   6 Pain Location: Back Pain Orientation: Lower;Left  Objective:   Exercise/Treatments Standing Heel Raises: 10 reps;Limitations Heel Raises Limitations: toe raises Functional Squats: 10 reps;Limitations Functional Squats Limitations: tactile guiding Seated Other Seated Lumbar Exercises: Heel Roll outs 10x10 sec holds Other Seated Lumbar Exercises: seated posture x 5' Supine Clam: 10 reps;Limitations Clam Limitations: w/ab set both  Bent Knee Raise: 5 reps;Limitations Bent Knee Raise Limitations: with ab sets Bridge: 10 reps Sidelying Clam: 5 reps (10 second holds)  Manual Therapy Manual Therapy: Other (comment) Massage: Soft tissue massage to lumbar paraspinals and L gluteal region to decrease pain and muscle spasms x 12' Other Manual Therapy: supine sciatic nerve gliding to reduce radicular pain symptoms  Physical Therapy Assessment and Plan PT Assessment and Plan Clinical Impression Statement: Pt continues to requre multimodal cueing for appropriate core activtion and to improve stabiltiy.  Added sciatic nerve glides to reduce radicular symptoms and continued soft tissue massage to reduce spasms/pain, pt reported pain reduced at end of session following manual techniques, PT Plan: Continue with core activiites. Keep exercises simple for improved consistancy with HEP.      Goals    Problem List Patient Active Problem List  Diagnosis  . HYPOTHYROIDISM  . OBESITY  . GERD  . FATTY LIVER  DISEASE  . FIBROMYALGIA  . FATIGUE, CHRONIC  . CHOLELITHIASIS, HX OF  . Abdominal pain  . Diarrhea  . Lumbago    PT - End of Session Activity Tolerance: Patient tolerated treatment well General Behavior During Session: [facility] for tasks performed Cognition: [facility] for tasks performed  GP    Juel Burrow 06/08/2012, 3:43 PM

## 2012-06-15 ENCOUNTER — Ambulatory Visit (HOSPITAL_COMMUNITY)
Admission: RE | Admit: 2012-06-15 | Discharge: 2012-06-15 | Disposition: A | Discharge status: Home / Self Care | Payer: Medicare Other | Source: Ambulatory Visit | Attending: Neurosurgery | Admitting: Neurosurgery

## 2012-06-15 NOTE — Progress Notes (Signed)
Physical Therapy Treatment Patient Details  Name: [name] MRN: [id number] Date of Birth: 1940/06/22  Today's Date: 06/15/2012 Time: 4098-1191 PT Time Calculation (min): 48 min Charge: Manual 10', therex 32'  Visit#: 3 of 4  Re-eval: 06/24/12    Authorization: [employer] Medicare  Authorization Time Period:    Authorization Visit#: 3 of 4   Subjective: Symptoms/Limitations Symptoms: Pt reported she was sore following last session, reported Rt hip pain 6/10 Pain Assessment Currently in Pain?: Yes Pain Score:   6 Pain Location: Back Pain Orientation: Right;Lower  Objective:   Exercise/Treatments Standing Heel Raises: 10 reps;Limitations Heel Raises Limitations: toe raises Functional Squats: 10 reps;Limitations Functional Squats Limitations: tactile guiding Scapular Retraction: Both;5 reps;Theraband Theraband Level (Scapular Retraction): Level 3 (Green) Row: Both;5 reps;Theraband Theraband Level (Row): Level 3 (Green) Shoulder Extension: Both;5 reps;Theraband Theraband Level (Shoulder Extension): Level 3 (Green) Prone  Straight Leg Raise: 10 reps  Manual Therapy Manual Therapy: Massage Massage: Soft tissue massage to quadratus lumborum, lumbar paraspinals and R gluteal region to decrease pain and spasms x 10'  Physical Therapy Assessment and Plan PT Assessment and Plan Clinical Impression Statement: Pt with imporved mechanics following cues for form with functional squats.  Pt able to verbalize mechanics for proper form, " keep stomach tight, stick out butt like I am huddling over toilet, always able to see toes(pressure on heels)...".  Added postural strengthening exercises with theraband with min cueing for technique.  Manual STM complete to Rt quadratus lumborum and gluteal, able to reduce spasms palpated and pt reported decreased pain. PT Plan: Continue with core activiites. Keep exercises simple for improved consistancy with HEP.  Give pt theraband w/ worksheet to  add to HEP per last approved session.    Goals    Problem List Patient Active Problem List  Diagnosis  . HYPOTHYROIDISM  . OBESITY  . GERD  . FATTY LIVER DISEASE  . FIBROMYALGIA  . FATIGUE, CHRONIC  . CHOLELITHIASIS, HX OF  . Abdominal pain  . Diarrhea  . Lumbago    PT - End of Session Activity Tolerance: Patient tolerated treatment well General Behavior During Session: [facility] for tasks performed Cognition: [facility] for tasks performed  GP    Juel Burrow 06/15/2012, 3:00 PM

## 2012-06-22 ENCOUNTER — Ambulatory Visit (HOSPITAL_COMMUNITY)
Admission: RE | Admit: 2012-06-22 | Discharge: 2012-06-22 | Disposition: A | Discharge status: Home / Self Care | Payer: Medicare Other | Source: Ambulatory Visit | Attending: Neurosurgery | Admitting: Neurosurgery

## 2012-06-22 DIAGNOSIS — M545 Low back pain, unspecified: Secondary | ICD-10-CM | POA: Insufficient documentation

## 2012-06-22 DIAGNOSIS — IMO0001 Reserved for inherently not codable concepts without codable children: Secondary | ICD-10-CM | POA: Insufficient documentation

## 2012-06-22 DIAGNOSIS — M6281 Muscle weakness (generalized): Secondary | ICD-10-CM | POA: Insufficient documentation

## 2012-06-27 NOTE — Evaluation (Signed)
Physical Therapy Discharge Summary Patient Details  Name: [name] MRN: [id number] Date of Birth: Oct 17, 1940  Today's Date: 06/27/2012 Time: 4098-1191 PT Time Calculation (min): 31 min Charges: 1 ROM, 1 MMT, 23' Self Care             Visit#: 4 of 4  Re-eval: 06/24/12 Assessment Diagnosis: LBP Next MD Visit: Dr. Lovell Sheehan - unscheudled Prior Therapy: Years ago for LBP.  Authorization: [employer] Medicare    Authorization Time Period:    Authorization Visit#: 4 of 4   Subjective Symptoms/Limitations Symptoms: Pt reported she went shopping yesterday, on feet all day with increased LBP today 7/10.  Pt with increased ease putting shoes on and off. How long can you sit comfortably?: able to sit 1 hour How long can you stand comfortably?: able to stand 40 minutes How long can you walk comfortably?: able to walk 35 minutes Pain Assessment Currently in Pain?: Yes Pain Score:   7  Sensation/Coordination/Flexibility/Functional Tests Functional Tests Functional Tests: ODI 52% (ODI: 60%)  Assessment RLE Strength Right Hip Flexion: 3+/5 (was 3+/5) Right Hip Extension: 3/5 (was 2/5) Right Hip ABduction: 5/5 (was 3/5) Right Hip ADduction: 5/5 Right Knee Flexion: 5/5 (was 3+/5) Right Knee Extension: 5/5 (was 4/5) LLE Strength Left Hip Flexion: 3+/5 (was 3+/5) Left Hip Extension: 3-/5 (was 2/5) Left Hip ABduction: 5/5 (was 3/5) Left Hip ADduction: 5/5 Left Knee Flexion: 5/5 Left Knee Extension: 5/5 Lumbar AROM Lumbar Flexion: Decreased: 50 (was Decreased 50) Lumbar Extension: Decreased 25% (was Decreased 50%) Lumbar - Right Side Bend: Decreased 10%  (Decreased 25% - increased pain) Lumbar - Left Side Bend: Decreased 10%  (Decreased 10% )  Exercise/Treatments Standing Scapular Retraction: Both;5 reps;Theraband Theraband Level (Scapular Retraction): Level 3 (Green) Row: Both;5 reps;Theraband Theraband Level (Row): Level 3 (Green) Shoulder Extension: Both;5 reps;Theraband Theraband  Level (Shoulder Extension): Level 3 (Green) Prone  Straight Leg Raise: 10 reps  Physical Therapy Assessment and Plan PT Assessment and Plan Clinical Impression Statement: [name] has attended 4 OP PT visits over 4 weeks to address LBP with the following finidngs: she is independent with her HEP and is progressing towards her goals and at this point is ready to be d/c from PT as she continues to improve her strength, flexibility and ability on her own.  Overall she feels she has improved with PT and has recieved all education she needed to continue with her exercises at home.  PT Plan: D/C    Goals Home Exercise Program PT Goal: Perform Home Exercise Program - Progress: Met PT Short Term Goals PT Short Term Goal 1: Pt will report pain less than a 3/10 for 75% of her day for improved QOL.  PT Short Term Goal 1 - Progress: Progressing toward goal PT Short Term Goal 2: Pt will improve her core coordination in order to tolerate sitting for greater than 60 minutes to continue with her computer activities.  PT Short Term Goal 2 - Progress: Progressing toward goal PT Short Term Goal 3: Pt will improve her hip and core strength to [facility] in order to tolerate standing for greater than 1 hour without increased pain to continue with household activities.  PT Short Term Goal 3 - Progress: Progressing toward goal PT Short Term Goal 4: Pt will improve her ODI to less than or equat to 36% for improved QOL.  PT Short Term Goal 4 - Progress: Progressing toward goal  Problem List Patient Active Problem List  Diagnosis  . HYPOTHYROIDISM  .  OBESITY  . GERD  . FATTY LIVER DISEASE  . FIBROMYALGIA  . FATIGUE, CHRONIC  . CHOLELITHIASIS, HX OF  . Abdominal pain  . Diarrhea  . Lumbago    PT - End of Session Activity Tolerance: Patient tolerated treatment well General Behavior During Session: [facility] for tasks performed Cognition: [facility] for tasks performed PT Plan of Care PT Home Exercise Plan: provided  with green theraband PT Patient Instructions: discussed in length about HEP and provided with updated HEP. Consulted and Agree with Plan of Care: Patient  GP Functional Assessment Tool Used: ODI: 52% Functional Limitation: Self care Self Care Goal Status (Z6109): At least 20 percent but less than 40 percent impaired, limited or restricted Self Care Discharge Status [phone]): At least 20 percent but less than 40 percent impaired, limited or restricted  [name] 06/27/2012, 12:25 PM  Physician Documentation Your signature is required to indicate approval of the treatment plan as stated above.  Please sign and either send electronically or make a copy of this report for your files and return this physician signed original.   Please mark one 1.__approve of plan  2. ___approve of plan with the following conditions.   ______________________________                                                          _____________________ Physician Signature                                                                                                             Date

## 2012-12-12 ENCOUNTER — Other Ambulatory Visit (HOSPITAL_COMMUNITY): Payer: Self-pay | Admitting: Family Medicine

## 2012-12-12 DIAGNOSIS — Z139 Encounter for screening, unspecified: Secondary | ICD-10-CM

## 2013-01-09 ENCOUNTER — Ambulatory Visit (HOSPITAL_COMMUNITY)
Admission: RE | Admit: 2013-01-09 | Discharge: 2013-01-09 | Disposition: A | Discharge status: Home / Self Care | Payer: Medicare Other | Source: Ambulatory Visit | Attending: Family Medicine | Admitting: Family Medicine

## 2013-01-09 DIAGNOSIS — Z1231 Encounter for screening mammogram for malignant neoplasm of breast: Secondary | ICD-10-CM | POA: Insufficient documentation

## 2013-01-09 DIAGNOSIS — Z139 Encounter for screening, unspecified: Secondary | ICD-10-CM

## 2013-08-01 ENCOUNTER — Ambulatory Visit: Payer: Medicare Other | Admitting: Gastroenterology

## 2013-09-19 ENCOUNTER — Telehealth: Payer: Self-pay | Admitting: Internal Medicine

## 2013-09-19 NOTE — Telephone Encounter (Signed)
Patient received a letter stating it is time to come  In for an ov for repeat TCS, Mrs. [name] question is since the last TCS was unsuccessful due to diverticulum and a barium enema was needed to finish the procedure could she just skip the TCS this time and do a Capsule Study instead, please advise her before she schedules and ov

## 2013-09-19 NOTE — Telephone Encounter (Signed)
[name], please let pt know that we will have to discuss this with RMR when he returns.

## 2013-09-19 NOTE — Telephone Encounter (Signed)
Routing to RMR to review when he returns.

## 2013-09-20 NOTE — Telephone Encounter (Signed)
Noted  

## 2013-12-11 ENCOUNTER — Other Ambulatory Visit (HOSPITAL_COMMUNITY): Payer: Self-pay | Admitting: Internal Medicine

## 2013-12-11 DIAGNOSIS — Z1231 Encounter for screening mammogram for malignant neoplasm of breast: Secondary | ICD-10-CM

## 2014-01-10 ENCOUNTER — Ambulatory Visit (HOSPITAL_COMMUNITY)
Admission: RE | Admit: 2014-01-10 | Discharge: 2014-01-10 | Disposition: A | Discharge status: Home / Self Care | Payer: Medicare Other | Source: Ambulatory Visit | Attending: Internal Medicine | Admitting: Internal Medicine

## 2014-01-10 DIAGNOSIS — Z1231 Encounter for screening mammogram for malignant neoplasm of breast: Secondary | ICD-10-CM | POA: Diagnosis present

## 2014-02-26 ENCOUNTER — Telehealth: Payer: Self-pay | Admitting: Internal Medicine

## 2014-02-26 NOTE — Telephone Encounter (Signed)
Patient called to make OV. She said is not having any problems, but her insurance wanted her to have screening done before end of year. She wasn't sure if she was due for tcs or not. Does she need OV or can she be triaged? Please advise and call [phone]

## 2014-02-28 NOTE — Telephone Encounter (Signed)
LMOM for a return call. See phone note of 09/19/2013. Pt needs OV.

## 2014-03-02 DIAGNOSIS — M79673 Pain in unspecified foot: Secondary | ICD-10-CM

## 2014-03-13 NOTE — Telephone Encounter (Signed)
Pt is scheduled an OV on 04/04/2014 at 2:00 PM with Wynne DustEric Gill, NP.

## 2014-04-04 ENCOUNTER — Telehealth: Payer: Self-pay | Admitting: Nurse Practitioner

## 2014-04-04 ENCOUNTER — Other Ambulatory Visit: Payer: Self-pay

## 2014-04-04 ENCOUNTER — Encounter: Payer: Self-pay | Admitting: Nurse Practitioner

## 2014-04-04 ENCOUNTER — Ambulatory Visit (INDEPENDENT_AMBULATORY_CARE_PROVIDER_SITE_OTHER): Payer: Medicare Other | Admitting: Nurse Practitioner

## 2014-04-04 VITALS — BP 131/75 | HR 75 | Temp 98.1°F | Ht 63.0 in | Wt 162.8 lb

## 2014-04-04 DIAGNOSIS — Z1211 Encounter for screening for malignant neoplasm of colon: Secondary | ICD-10-CM

## 2014-04-04 MED ORDER — PEG-KCL-NACL-NASULF-NA ASC-C 100 G PO SOLR: 1.0000 | Freq: Once | ORAL | Status: AC

## 2014-04-04 NOTE — Telephone Encounter (Signed)
I cannot find the procedure report from the patient's last colonoscopy in 2006 with Dr. Lovell SheehanJenkins. Can we request those records? Thanks.

## 2014-04-04 NOTE — Progress Notes (Signed)
Primary Care Physician:  Warrick Parisian, MD Primary Gastroenterologist:  Dr. Jena Gauss  Chief Complaint  Patient presents with  . Colonoscopy    HPI:   74 year old female presents for follow-up routine colonoscopy. Last colonoscopy in 2006 with Dr. Lovell Sheehan which she states was a bad experience. Per the patient she has diverticuloses and the scope "kept getting stuck" in the diverticula and the procedure was unable to be completed. A barium enema was done to be able to complete the procedure which she states she could not tolerate. Received a letter stating it's time for her follow-up colonoscopy. Per previous notes the colonoscopy was without significant findings. Is anxious/hesitant at the idea of another procedure due to her past experience.   Currently states she's doing well. Still has a lot of gas and occasional diarrhea which was attributed to her Synthroid by her PCP and she states it is tolerable/managable at this point. Has occasional acid reflux symptoms if she eats late at night just before bed. Otherwise her symptoms are well controlled on her current PPI therapy of Prilosec  daily. Denies dysphagia. Has a bowel movement daily. When she's not having a simple bout of diarrhea her stools are normal soft/formed. Eats a high fiber diet. Denies hematochezia and melena. Denies any other upper or lower GI symptoms.   Past Medical History  Diagnosis Date  . Hypothyroid   . Hyperglycemia   . Diverticulosis   . GERD (gastroesophageal reflux disease) 2006    EGD by Dr. Lovell Sheehan  . Fibromyalgia   . S/P colonoscopy 2006    Dr. Lovell Sheehan  . Fatty liver   . Hepatitis C antibody test positive     negative pcr  . Chronic back pain   . Hemorrhage after delivery of fetus     Past Surgical History  Procedure Laterality Date  . Cholecystectomy  1998  . Total abdominal hysterectomy w/ bilateral salpingoophorectomy    . Cataracts    . Lumbar disc surgery    . Appendectomy       Current Outpatient Prescriptions  Medication Sig Dispense Refill  . aspirin 81 MG tablet Take 81 mg by mouth daily.      . Calcium-Vitamin D-Vitamin K (CALCIUM + D + K PO) Take 500 mg by mouth.      . citalopram (CELEXA) 20 MG tablet Take 20 mg by mouth daily.      . fish oil-omega-3 fatty acids 1000 MG capsule Take 2 g by mouth daily.      Marland Kitchen levothyroxine (SYNTHROID, LEVOTHROID) 88 MCG tablet Take 88 mcg by mouth daily.      Marland Kitchen lidocaine (LIDODERM) 5 % Place 1 patch onto the skin daily. Remove & Discard patch within 12 hours or as directed by MD     . Milnacipran HCl (SAVELLA) 25 MG TABS Take by mouth.      . Multiple Vitamin (MULTIVITAMIN) capsule Take 1 capsule by mouth daily.      . Nutritional Supplements (ESTROVEN PO) Take by mouth.      Marland Kitchen omeprazole (PRILOSEC) 20 MG capsule Take 20 mg by mouth 2 (two) times daily.     . pregabalin (LYRICA) 200 MG capsule Take 200 mg by mouth 2 (two) times daily.      Marland Kitchen zolpidem (AMBIEN CR) 12.5 MG CR tablet Take 12.5 mg by mouth at bedtime as needed.      . hydrochlorothiazide 25 MG tablet Take 25 mg by mouth daily.  No current facility-administered medications for this visit.    Allergies as of 04/04/2014 - Review Complete 04/04/2014  Allergen Reaction Noted  . Sulfonamide derivatives      Family History  Problem Relation Age of Onset  . Brain cancer Father   . Coronary artery disease Mother   . Prostate cancer Brother   . Cancer Sister     gyn  . Lung cancer Brother   . Seizures Son   . Pancreatitis Daughter     ?gallstones  . Colon cancer Neg Hx     History   Social History  . Marital Status: Married    Spouse Name: N/A    Number of Children: 3  . Years of Education: N/A   Occupational History  . retired     [employer]   Social History Main Topics  . Smoking status: Never Smoker   . Smokeless tobacco: Not on file  . Alcohol Use: No  . Drug Use: No  . Sexual Activity: No   Other Topics  Concern  . Not on file   Social History Narrative    Review of Systems: Gen: Denies any fever, chills, fatigue, weight loss, lack of appetite.  CV: Denies chest pain, heart palpitations, peripheral edema, syncope.  Resp: Denies shortness of breath at rest or with exertion. Denies wheezing or cough. Denies difficulty breathing while laying flat. GI: See HPI. Denies dysphagia or odynophagia. Denies jaundice, hematemesis, fecal incontinence. GU : Denies urinary burning, urinary frequency, urinary hesitancy MS: Denies joint pain, muscle weakness, cramps, or limitation of movement.  Derm: Denies rash, itching, dry skin Psych: Denies depression, anxiety, memory loss. Heme: Denies bruising, bleeding.  Physical Exam: BP 131/75 mmHg  Pulse 75  Temp(Src) 98.1 F (36.7 C) (Oral)  Ht 5\' 3"  (1.6 m)  Wt 162 lb 12.8 oz (73.846 kg)  BMI 28.85 kg/m2 General:   Alert and oriented. Pleasant and cooperative. Well-nourished and well-developed.  Head:  Normocephalic and atraumatic. Eyes:  Without icterus, sclera clear and conjunctiva pink.  Ears:  Normal auditory acuity. Mouth:  No deformity or lesions, oral mucosa pink. No OP edema. Neck:  Supple, without mass or thyromegaly. Lungs:  Clear to auscultation bilaterally. No wheezes, rales, or rhonchi. No distress.  Heart:  S1, S2 present without murmurs appreciated.  Abdomen:  +BS, soft, non-tender and non-distended. No HSM noted. No guarding or rebound. No masses appreciated.  Rectal:  Deferred  Msk:  Symmetrical without gross deformities. Normal posture. Pulses:  Normal DP pulses noted. Extremities:  Without clubbing or edema. Neurologic:  Alert and  oriented x4;  grossly normal neurologically. Skin:  Intact without significant lesions or rashes. Cervical Nodes:  No significant cervical adenopathy. Psych:  Alert and cooperative. Normal mood and affect.     04/04/2014 2:53 PM

## 2014-04-04 NOTE — Addendum Note (Signed)
Addended by: [place]az[place]JORDAN, Eirene Rather M on: 04/04/2014 03:24 PM   Modules accepted: Medications

## 2014-04-04 NOTE — Assessment & Plan Note (Signed)
Patient due for screenign colonosocopy. Per patient last experience in 2006 with Dr. Lovell SheehanJenkins was very unfortunate. States she kept waking up with pain and heard them saying the scope was stuck in her diverticula and the procedure could not be completed. She was sent for a barium enema which she could not retain. Per previous office visits there were no significant findings but the procedure report could not be found in the system. I have requested those records for review. She is not having any bleeding or other red flag/warning signs/symptoms. Suffers from chronic pain and is on Lyrica and Ambien. Because of this, patient anxiety about the procedure, and per patient request we will schedule the procedure in the OR with propofol for adequate sedation.

## 2014-04-04 NOTE — Patient Instructions (Signed)
1. We will schedule your colonoscopy with Dr. Jena Gaussourk in the OR with deeper sedation 2. Further recommendations to be based on the results of the procedure.

## 2014-04-05 NOTE — Telephone Encounter (Signed)
Requested records from Dr Lovell SheehanJenkins' office.

## 2014-04-10 NOTE — Progress Notes (Signed)
cc'ed to pcp °

## 2014-04-20 NOTE — Patient Instructions (Signed)
[name]  04/20/2014   Your procedure is scheduled on:  04/26/2014  Report to [facility] at 7:30 AM.  Call this number if you have problems the morning of surgery: [phone]   Remember:   Do not eat food or drink liquids after midnight.   Take these medicines the morning of surgery with A SIP OF WATER: Celexa, Synthroid, Prilosec, Lyrica   Do not wear jewelry, make-up or nail polish.  Do not wear lotions, powders, or perfumes. You may wear deodorant.  Do not shave 48 hours prior to surgery. Men may shave face and neck.  Do not bring valuables to the hospital.  [facility] is not responsible for any belongings or valuables.               Contacts, dentures or bridgework may not be worn into surgery.  Leave suitcase in the car. After surgery it may be brought to your room.  For patients admitted to the hospital, discharge time is determined by your treatment team.               Patients discharged the day of surgery will not be allowed to drive home.  Name and phone number of your driver:   Special Instructions: N/A   Please read over the following fact sheets that you were given: Anesthesia Post-op Instructions   PATIENT INSTRUCTIONS POST-ANESTHESIA  IMMEDIATELY FOLLOWING SURGERY:  Do not drive or operate machinery for the first twenty four hours after surgery.  Do not make any important decisions for twenty four hours after surgery or while taking narcotic pain medications or sedatives.  If you develop intractable nausea and vomiting or a severe headache please notify your doctor immediately.  FOLLOW-UP:  Please make an appointment with your surgeon as instructed. You do not need to follow up with anesthesia unless specifically instructed to do so.  WOUND CARE INSTRUCTIONS (if applicable):  Keep a dry clean dressing on the anesthesia/puncture wound site if there is drainage.  Once the wound has quit draining you may leave it open to air.  Generally you should leave the  bandage intact for twenty four hours unless there is drainage.  If the epidural site drains for more than 36-48 hours please call the anesthesia department.  QUESTIONS?:  Please feel free to call your physician or the hospital operator if you have any questions, and they will be happy to assist you.      Colonoscopy A colonoscopy is an exam to look at the entire large intestine (colon). This exam can help find problems such as tumors, polyps, inflammation, and areas of bleeding. The exam takes about 1 hour.  LET [facility] CARE PROVIDER KNOW ABOUT:   Any allergies you have.  All medicines you are taking, including vitamins, herbs, eye drops, creams, and over-the-counter medicines.  Previous problems you or members of your family have had with the use of anesthetics.  Any blood disorders you have.  Previous surgeries you have had.  Medical conditions you have. RISKS AND COMPLICATIONS  Generally, this is a safe procedure. However, as with any procedure, complications can occur. Possible complications include:  Bleeding.  Tearing or rupture of the colon wall.  Reaction to medicines given during the exam.  Infection (rare). BEFORE THE PROCEDURE   Ask your health care provider about changing or stopping your regular medicines.  You may be prescribed an oral bowel prep. This involves drinking a large amount of medicated liquid, starting the day before  your procedure. The liquid will cause you to have multiple loose stools until your stool is almost clear or light green. This cleans out your colon in preparation for the procedure.  Do not eat or drink anything else once you have started the bowel prep, unless your health care provider tells you it is safe to do so.  Arrange for someone to drive you home after the procedure. PROCEDURE   You will be given medicine to help you relax (sedative).  You will lie on your side with your knees bent.  A long, flexible tube with a light  and camera on the end (colonoscope) will be inserted through the rectum and into the colon. The camera sends video back to a computer screen as it moves through the colon. The colonoscope also releases carbon dioxide gas to inflate the colon. This helps your health care provider see the area better.  During the exam, your health care provider may take a small tissue sample (biopsy) to be examined under a microscope if any abnormalities are found.  The exam is finished when the entire colon has been viewed. AFTER THE PROCEDURE   Do not drive for 24 hours after the exam.  You may have a small amount of blood in your stool.  You may pass moderate amounts of gas and have mild abdominal cramping or bloating. This is caused by the gas used to inflate your colon during the exam.  Ask when your test results will be ready and how you will get your results. Make sure you get your test results. Document Released: 03/06/2000 Document Revised: 12/28/2012 Document Reviewed: 11/14/2012 [facility] Patient Information 2015 [employer] [place]. This information is not intended to replace advice given to you by your health care provider. Make sure you discuss any questions you have with your health care provider.

## 2014-04-23 ENCOUNTER — Encounter (HOSPITAL_COMMUNITY)
Admission: RE | Admit: 2014-04-23 | Discharge: 2014-04-23 | Disposition: A | Discharge status: Home / Self Care | Payer: Medicare Other | Source: Ambulatory Visit | Attending: Internal Medicine | Admitting: Internal Medicine

## 2014-04-23 ENCOUNTER — Encounter (HOSPITAL_COMMUNITY): Payer: Self-pay

## 2014-04-23 DIAGNOSIS — B1920 Unspecified viral hepatitis C without hepatic coma: Secondary | ICD-10-CM | POA: Diagnosis not present

## 2014-04-23 DIAGNOSIS — Z1211 Encounter for screening for malignant neoplasm of colon: Secondary | ICD-10-CM | POA: Diagnosis not present

## 2014-04-23 DIAGNOSIS — K219 Gastro-esophageal reflux disease without esophagitis: Secondary | ICD-10-CM | POA: Diagnosis not present

## 2014-04-23 DIAGNOSIS — K5730 Diverticulosis of large intestine without perforation or abscess without bleeding: Secondary | ICD-10-CM | POA: Diagnosis not present

## 2014-04-23 DIAGNOSIS — M549 Dorsalgia, unspecified: Secondary | ICD-10-CM | POA: Diagnosis not present

## 2014-04-23 DIAGNOSIS — E039 Hypothyroidism, unspecified: Secondary | ICD-10-CM | POA: Diagnosis not present

## 2014-04-23 DIAGNOSIS — Z79899 Other long term (current) drug therapy: Secondary | ICD-10-CM | POA: Diagnosis not present

## 2014-04-23 DIAGNOSIS — G8929 Other chronic pain: Secondary | ICD-10-CM | POA: Diagnosis not present

## 2014-04-23 DIAGNOSIS — Z7982 Long term (current) use of aspirin: Secondary | ICD-10-CM | POA: Diagnosis not present

## 2014-04-23 DIAGNOSIS — M797 Fibromyalgia: Secondary | ICD-10-CM | POA: Diagnosis not present

## 2014-04-23 LAB — CBC
HCT: 38.5 % (ref 36.0–46.0)
Hemoglobin: 12.6 g/dL (ref 12.0–15.0)
MCH: 31.1 pg (ref 26.0–34.0)
MCHC: 32.7 g/dL (ref 30.0–36.0)
MCV: 95.1 fL (ref 78.0–100.0)
Platelets: 267 10*3/uL (ref 150–400)
RBC: 4.05 MIL/uL (ref 3.87–5.11)
RDW: 12.8 % (ref 11.5–15.5)
WBC: 4.7 10*3/uL (ref 4.0–10.5)

## 2014-04-23 LAB — BASIC METABOLIC PANEL
Anion gap: 7 (ref 5–15)
BUN: 24 mg/dL — ABNORMAL HIGH (ref 6–23)
CO2: 24 mmol/L (ref 19–32)
Calcium: 9.5 mg/dL (ref 8.4–10.5)
Chloride: 109 mmol/L (ref 96–112)
Creatinine, Ser: 0.72 mg/dL (ref 0.50–1.10)
GFR calc Af Amer: >90 mL/min (ref >90)
GFR calc non Af Amer: 84 mL/min — ABNORMAL LOW (ref >90)
Glucose, Bld: 101 mg/dL — ABNORMAL HIGH (ref 70–99)
Potassium: 4.7 mmol/L (ref 3.5–5.1)
Sodium: 140 mmol/L (ref 135–145)

## 2014-04-26 ENCOUNTER — Ambulatory Visit (HOSPITAL_COMMUNITY): Payer: Medicare Other | Admitting: Anesthesiology

## 2014-04-26 ENCOUNTER — Ambulatory Visit (HOSPITAL_COMMUNITY)
Admission: RE | Admit: 2014-04-26 | Discharge: 2014-04-26 | Disposition: A | Discharge status: Home / Self Care | Payer: Medicare Other | Source: Ambulatory Visit | Attending: Internal Medicine | Admitting: Internal Medicine

## 2014-04-26 ENCOUNTER — Encounter (HOSPITAL_COMMUNITY): Payer: Self-pay | Admitting: *Deleted

## 2014-04-26 ENCOUNTER — Encounter (HOSPITAL_COMMUNITY)
Admission: RE | Disposition: A | Discharge status: Home / Self Care | Payer: Self-pay | Source: Ambulatory Visit | Attending: Internal Medicine

## 2014-04-26 DIAGNOSIS — Z79899 Other long term (current) drug therapy: Secondary | ICD-10-CM | POA: Insufficient documentation

## 2014-04-26 DIAGNOSIS — M549 Dorsalgia, unspecified: Secondary | ICD-10-CM | POA: Insufficient documentation

## 2014-04-26 DIAGNOSIS — Z1211 Encounter for screening for malignant neoplasm of colon: Secondary | ICD-10-CM | POA: Insufficient documentation

## 2014-04-26 DIAGNOSIS — E039 Hypothyroidism, unspecified: Secondary | ICD-10-CM | POA: Diagnosis not present

## 2014-04-26 DIAGNOSIS — K5730 Diverticulosis of large intestine without perforation or abscess without bleeding: Secondary | ICD-10-CM | POA: Diagnosis not present

## 2014-04-26 DIAGNOSIS — Z7982 Long term (current) use of aspirin: Secondary | ICD-10-CM | POA: Insufficient documentation

## 2014-04-26 DIAGNOSIS — B1920 Unspecified viral hepatitis C without hepatic coma: Secondary | ICD-10-CM | POA: Insufficient documentation

## 2014-04-26 DIAGNOSIS — G8929 Other chronic pain: Secondary | ICD-10-CM | POA: Insufficient documentation

## 2014-04-26 DIAGNOSIS — M797 Fibromyalgia: Secondary | ICD-10-CM | POA: Insufficient documentation

## 2014-04-26 DIAGNOSIS — K219 Gastro-esophageal reflux disease without esophagitis: Secondary | ICD-10-CM | POA: Insufficient documentation

## 2014-04-26 HISTORY — PX: COLONOSCOPY WITH PROPOFOL: SHX5780

## 2014-04-26 SURGERY — COLONOSCOPY WITH PROPOFOL
Anesthesia: Monitor Anesthesia Care

## 2014-04-26 MED ORDER — ONDANSETRON HCL 4 MG/2ML IJ SOLN
INTRAMUSCULAR | Status: AC
  Filled 2014-04-26: qty 2

## 2014-04-26 MED ORDER — LIDOCAINE HCL (CARDIAC) 10 MG/ML IV SOLN
INTRAVENOUS | Status: DC | PRN
  Administered 2014-04-26: 10 mg via INTRAVENOUS

## 2014-04-26 MED ORDER — PROPOFOL INFUSION 10 MG/ML OPTIME
INTRAVENOUS | Status: DC | PRN
  Administered 2014-04-26: 100 ug/kg/min via INTRAVENOUS
  Administered 2014-04-26: 150 ug/kg/min via INTRAVENOUS

## 2014-04-26 MED ORDER — WATER FOR IRRIGATION, STERILE IR SOLN
Status: DC | PRN
  Administered 2014-04-26: 1000 mL

## 2014-04-26 MED ORDER — FENTANYL CITRATE 0.05 MG/ML IJ SOLN
INTRAMUSCULAR | Status: AC
  Filled 2014-04-26: qty 2

## 2014-04-26 MED ORDER — LACTATED RINGERS IV SOLN
INTRAVENOUS | Status: DC
  Administered 2014-04-26: 09:00:00 via INTRAVENOUS

## 2014-04-26 MED ORDER — FENTANYL CITRATE 0.05 MG/ML IJ SOLN: 25.0000 ug | INTRAMUSCULAR | Status: DC | PRN

## 2014-04-26 MED ORDER — STERILE WATER FOR IRRIGATION IR SOLN
Status: DC | PRN
  Administered 2014-04-26: 09:00:00

## 2014-04-26 MED ORDER — GLYCOPYRROLATE 0.2 MG/ML IJ SOLN
INTRAMUSCULAR | Status: AC
  Filled 2014-04-26: qty 1

## 2014-04-26 MED ORDER — MIDAZOLAM HCL 2 MG/2ML IJ SOLN
INTRAMUSCULAR | Status: AC
  Filled 2014-04-26: qty 2

## 2014-04-26 MED ORDER — MIDAZOLAM HCL 2 MG/2ML IJ SOLN
1.0000 mg | INTRAMUSCULAR | Status: DC | PRN
  Administered 2014-04-26: 2 mg via INTRAVENOUS

## 2014-04-26 MED ORDER — ONDANSETRON HCL 4 MG/2ML IJ SOLN: 4.0000 mg | Freq: Once | INTRAMUSCULAR | Status: DC | PRN

## 2014-04-26 MED ORDER — GLYCOPYRROLATE 0.2 MG/ML IJ SOLN
0.2000 mg | Freq: Once | INTRAMUSCULAR | Status: AC
  Administered 2014-04-26: 0.2 mg via INTRAVENOUS

## 2014-04-26 MED ORDER — FENTANYL CITRATE 0.05 MG/ML IJ SOLN
25.0000 ug | INTRAMUSCULAR | Status: AC
  Administered 2014-04-26 (×2): 25 ug via INTRAVENOUS

## 2014-04-26 MED ORDER — ONDANSETRON HCL 4 MG/2ML IJ SOLN
4.0000 mg | Freq: Once | INTRAMUSCULAR | Status: AC
  Administered 2014-04-26: 4 mg via INTRAVENOUS

## 2014-04-26 SURGICAL SUPPLY — 23 items
ELECT REM PT RETURN 9FT ADLT (ELECTROSURGICAL)
ELECTRODE REM PT RTRN 9FT ADLT (ELECTROSURGICAL) IMPLANT
FCP BXJMBJMB 240X2.8X (CUTTING FORCEPS)
FLOOR PAD 36X40 (MISCELLANEOUS) ×2
FORCEPS BIOP RAD 4 LRG CAP 4 (CUTTING FORCEPS) IMPLANT
FORCEPS BIOP RJ4 240 W/NDL (CUTTING FORCEPS)
FORCEPS BXJMBJMB 240X2.8X (CUTTING FORCEPS) IMPLANT
FORMALIN 10 PREFIL 20ML (MISCELLANEOUS) IMPLANT
INJECTOR/SNARE I SNARE (MISCELLANEOUS) IMPLANT
KIT CLEAN ENDO COMPLIANCE (KITS) ×2 IMPLANT
LUBRICANT JELLY 4.5OZ STERILE (MISCELLANEOUS) ×2 IMPLANT
MANIFOLD NEPTUNE II (INSTRUMENTS) ×2 IMPLANT
NEEDLE SCLEROTHERAPY 25GX240 (NEEDLE) IMPLANT
PAD FLOOR 36X40 (MISCELLANEOUS) ×1 IMPLANT
PROBE APC STR FIRE (PROBE) IMPLANT
PROBE INJECTION GOLD (MISCELLANEOUS)
PROBE INJECTION GOLD 7FR (MISCELLANEOUS) IMPLANT
SNARE ROTATE MED OVAL 20MM (MISCELLANEOUS) IMPLANT
SNARE SHORT THROW 13M SML OVAL (MISCELLANEOUS) IMPLANT
SYR 50ML LL SCALE MARK (SYRINGE) ×2 IMPLANT
TRAP SPECIMEN MUCOUS 40CC (MISCELLANEOUS) IMPLANT
TUBING IRRIGATION ENDOGATOR (MISCELLANEOUS) ×2 IMPLANT
WATER STERILE IRR 1000ML POUR (IV SOLUTION) ×2 IMPLANT

## 2014-04-26 NOTE — H&P (View-Only) (Signed)
  Primary Care Physician:  STALLINGS,SHEILA, MD Primary Gastroenterologist:  Dr. Rourk  Chief Complaint  Patient presents with  . Colonoscopy    HPI:   74 year old female presents for follow-up routine colonoscopy. Last colonoscopy in 2006 with Dr. Jenkins which she states was a bad experience. Per the patient she has diverticuloses and the scope "kept getting stuck" in the diverticula and the procedure was unable to be completed. A barium enema was done to be able to complete the procedure which she states she could not tolerate. Received a letter stating it's time for her follow-up colonoscopy. Per previous notes the colonoscopy was without significant findings. Is anxious/hesitant at the idea of another procedure due to her past experience.   Currently states she's doing well. Still has a lot of gas and occasional diarrhea which was attributed to her Synthroid by her PCP and she states it is tolerable/managable at this point. Has occasional acid reflux symptoms if she eats late at night just before bed. Otherwise her symptoms are well controlled on her current PPI therapy of Prilosec 20mg daily. Denies dysphagia. Has a bowel movement daily. When she's not having a simple bout of diarrhea her stools are normal soft/formed. Eats a high fiber diet. Denies hematochezia and melena. Denies any other upper or lower GI symptoms.   Past Medical History  Diagnosis Date  . Hypothyroid   . Hyperglycemia   . Diverticulosis   . GERD (gastroesophageal reflux disease) 2006    EGD by Dr. Jenkins  . Fibromyalgia   . S/P colonoscopy 2006    Dr. Jenkins  . Fatty liver   . Hepatitis C antibody test positive     negative pcr  . Chronic back pain   . Hemorrhage after delivery of fetus     Past Surgical History  Procedure Laterality Date  . Cholecystectomy  1998  . Total abdominal hysterectomy w/ bilateral salpingoophorectomy    . Cataracts    . Lumbar disc surgery    . Appendectomy       Current Outpatient Prescriptions  Medication Sig Dispense Refill  . aspirin 81 MG tablet Take 81 mg by mouth daily.      . Calcium-Vitamin D-Vitamin K (CALCIUM + D + K PO) Take 500 mg by mouth.      . citalopram (CELEXA) 20 MG tablet Take 20 mg by mouth daily.      . fish oil-omega-3 fatty acids 1000 MG capsule Take 2 g by mouth daily.      . levothyroxine (SYNTHROID, LEVOTHROID) 88 MCG tablet Take 88 mcg by mouth daily.      . lidocaine (LIDODERM) 5 % Place 1 patch onto the skin daily. Remove & Discard patch within 12 hours or as directed by MD     . Milnacipran HCl (SAVELLA) 25 MG TABS Take by mouth.      . Multiple Vitamin (MULTIVITAMIN) capsule Take 1 capsule by mouth daily.      . Nutritional Supplements (ESTROVEN PO) Take by mouth.      . omeprazole (PRILOSEC) 20 MG capsule Take 20 mg by mouth 2 (two) times daily.     . pregabalin (LYRICA) 200 MG capsule Take 200 mg by mouth 2 (two) times daily.      . zolpidem (AMBIEN CR) 12.5 MG CR tablet Take 12.5 mg by mouth at bedtime as needed.      . hydrochlorothiazide 25 MG tablet Take 25 mg by mouth daily.         No current facility-administered medications for this visit.    Allergies as of 04/04/2014 - Review Complete 04/04/2014  Allergen Reaction Noted  . Sulfonamide derivatives      Family History  Problem Relation Age of Onset  . Brain cancer Father   . Coronary artery disease Mother   . Prostate cancer Brother   . Cancer Sister     gyn  . Lung cancer Brother   . Seizures Son   . Pancreatitis Daughter     ?gallstones  . Colon cancer Neg Hx     History   Social History  . Marital Status: Married    Spouse Name: N/A    Number of Children: 3  . Years of Education: N/A   Occupational History  . retired     [employer]   Social History Main Topics  . Smoking status: Never Smoker   . Smokeless tobacco: Not on file  . Alcohol Use: No  . Drug Use: No  . Sexual Activity: No   Other Topics  Concern  . Not on file   Social History Narrative    Review of Systems: Gen: Denies any fever, chills, fatigue, weight loss, lack of appetite.  CV: Denies chest pain, heart palpitations, peripheral edema, syncope.  Resp: Denies shortness of breath at rest or with exertion. Denies wheezing or cough. Denies difficulty breathing while laying flat. GI: See HPI. Denies dysphagia or odynophagia. Denies jaundice, hematemesis, fecal incontinence. GU : Denies urinary burning, urinary frequency, urinary hesitancy MS: Denies joint pain, muscle weakness, cramps, or limitation of movement.  Derm: Denies rash, itching, dry skin Psych: Denies depression, anxiety, memory loss. Heme: Denies bruising, bleeding.  Physical Exam: BP 131/75 mmHg  Pulse 75  Temp(Src) 98.1 F (36.7 C) (Oral)  Ht 5' 3" (1.6 m)  Wt 162 lb 12.8 oz (73.846 kg)  BMI 28.85 kg/m2 General:   Alert and oriented. Pleasant and cooperative. Well-nourished and well-developed.  Head:  Normocephalic and atraumatic. Eyes:  Without icterus, sclera clear and conjunctiva pink.  Ears:  Normal auditory acuity. Mouth:  No deformity or lesions, oral mucosa pink. No OP edema. Neck:  Supple, without mass or thyromegaly. Lungs:  Clear to auscultation bilaterally. No wheezes, rales, or rhonchi. No distress.  Heart:  S1, S2 present without murmurs appreciated.  Abdomen:  +BS, soft, non-tender and non-distended. No HSM noted. No guarding or rebound. No masses appreciated.  Rectal:  Deferred  Msk:  Symmetrical without gross deformities. Normal posture. Pulses:  Normal DP pulses noted. Extremities:  Without clubbing or edema. Neurologic:  Alert and  oriented x4;  grossly normal neurologically. Skin:  Intact without significant lesions or rashes. Cervical Nodes:  No significant cervical adenopathy. Psych:  Alert and cooperative. Normal mood and affect.     04/04/2014 2:53 PM  

## 2014-04-26 NOTE — Anesthesia Procedure Notes (Signed)
Procedure Name: MAC Date/Time: 04/26/2014 8:59 AM Performed by: Franco NonesYATES, Renold Kozar S Pre-anesthesia Checklist: Patient identified, Emergency Drugs available, Suction available, Timeout performed and Patient being monitored Patient Re-evaluated:Patient Re-evaluated prior to inductionOxygen Delivery Method: Non-rebreather mask

## 2014-04-26 NOTE — Op Note (Signed)
[facility] [address] [place], [id number]   COLONOSCOPY PROCEDURE REPORT  PATIENT: [name], Melanie E  MR#: [id number] BIRTHDATE: 29-May-1940 , 73  yrs. old GENDER: female ENDOSCOPIST: R.  Roetta SessionsMichael Hayzlee Mcsorley, MD FACP [facility] Inc[facility] REFERRED XB:JYNWGNBY:Sheila Buckner Malta Stallings, M.D. PROCEDURE DATE:  04/26/2014 PROCEDURE:   Colonoscopy, screening INDICATIONS:Average risk colorectal cancer screening examination. MEDICATIONS: Deep sedation per Dr.  [facility] ASA CLASS:       Class II  CONSENT: The risks, benefits, alternatives and imponderables including but not limited to bleeding, perforation as well as the possibility of a missed lesion have been reviewed.  The potential for biopsy, lesion removal, etc. have also been discussed. Questions have been answered.  All parties agreeable.  Please see the history and physical in the medical record for more information.  DESCRIPTION OF PROCEDURE:   After the risks benefits and alternatives of the procedure were thoroughly explained, informed consent was obtained.  The digital rectal exam revealed no abnormalities of the rectum.   The     endoscope was introduced through the anus and advanced to the cecum, which was identified by both the appendix and ileocecal valve. No adverse events experienced.   The quality of the prep was adequate.  The instrument was then slowly withdrawn as the colon was fully examined.      COLON FINDINGS: Internal hemorrhoids.  Scattered left-sided diverticula.  Noncompliant sigmoid colon which, initially, I could not negotiate with the adult scope in spite of changing of the patient's position and intermittent strategically employed external abdominal pressure.  Subsequently, I withdrew the adult scope and obtained a pediatric colonoscope and was able to get to the cecum with changing of the patient's position and gentle external abdominal pressure.  The patient had a noncompliant left colon;the more  proximal colon was redundant.  However, aside from diverticulosis, the colonic mucosa appeared normal.  Retroflexion was performed. .  Withdrawal time=10 minutes 0 seconds.  The scope was withdrawn and the procedure completed. COMPLICATIONS: There were no immediate complications.  ENDOSCOPIC IMPRESSION: Colonic diverticulosis. Noncompliant and redundant colon  RECOMMENDATIONS: I do not recommend a future colonoscopy unless the patient develops new GI symptoms.  eSigned:  R. Roetta SessionsMichael Suhailah Kwan, MD Jerrel IvoryFACP [facility] CenterFACG 04/26/2014 9:59 AM   cc:  CPT CODES: ICD CODES:  The ICD and CPT codes recommended by this software are interpretations from the data that the clinical staff has captured with the software.  The verification of the translation of this report to the ICD and CPT codes and modifiers is the sole responsibility of the health care institution and practicing physician where this report was generated.  [employer] will not be held responsible for the validity of the ICD and CPT codes included on this report.  AMA assumes no liability for data contained or not contained herein. CPT is a Publishing rights managerregistered trademark of the [employer].

## 2014-04-26 NOTE — Discharge Instructions (Signed)
Colonoscopy Discharge Instructions  Read the instructions outlined below and refer to this sheet in the next few weeks. These discharge instructions provide you with general information on caring for yourself after you leave the hospital. Your doctor may also give you specific instructions. While your treatment has been planned according to the most current medical practices available, unavoidable complications occasionally occur. If you have any problems or questions after discharge, call Dr. Jena Gaussourk at [phone]. ACTIVITY  You may resume your regular activity, but move at a slower pace for the next 24 hours.   Take frequent rest periods for the next 24 hours.   Walking will help get rid of the air and reduce the bloated feeling in your belly (abdomen).   No driving for 24 hours (because of the medicine (anesthesia) used during the test).    Do not sign any important legal documents or operate any machinery for 24 hours (because of the anesthesia used during the test).  NUTRITION  Drink plenty of fluids.   You may resume your normal diet as instructed by your doctor.   Begin with a light meal and progress to your normal diet. Heavy or fried foods are harder to digest and may make you feel sick to your stomach (nauseated).   Avoid alcoholic beverages for 24 hours or as instructed.  MEDICATIONS  You may resume your normal medications unless your doctor tells you otherwise.  WHAT YOU CAN EXPECT TODAY  Some feelings of bloating in the abdomen.   Passage of more gas than usual.   Spotting of blood in your stool or on the toilet paper.  IF YOU HAD POLYPS REMOVED DURING THE COLONOSCOPY:  No aspirin products for 7 days or as instructed.   No alcohol for 7 days or as instructed.   Eat a soft diet for the next 24 hours.  FINDING OUT THE RESULTS OF YOUR TEST Not all test results are available during your visit. If your test results are not back during the visit, make an appointment  with your caregiver to find out the results. Do not assume everything is normal if you have not heard from your caregiver or the medical facility. It is important for you to follow up on all of your test results.  SEEK IMMEDIATE MEDICAL ATTENTION IF:  You have more than a spotting of blood in your stool.   Your belly is swollen (abdominal distention).   You are nauseated or vomiting.   You have a temperature over 101.   You have abdominal pain or discomfort that is severe or gets worse throughout the day.    Diverticulosis information  I do not recommend a future colonoscopy unless you develop new symptoms  Diverticulosis Diverticulosis is the condition that develops when small pouches (diverticula) form in the wall of your colon. Your colon, or large intestine, is where water is absorbed and stool is formed. The pouches form when the inside layer of your colon pushes through weak spots in the outer layers of your colon. CAUSES  No one knows exactly what causes diverticulosis. RISK FACTORS  Being older than 50. Your risk for this condition increases with age. Diverticulosis is rare in people younger than 40 years. By age 73, almost everyone has it.  Eating a low-fiber diet.  Being frequently constipated.  Being overweight.  Not getting enough exercise.  Smoking.  Taking over-the-counter pain medicines, like aspirin and ibuprofen. SYMPTOMS  Most people with diverticulosis do not have symptoms. DIAGNOSIS  Because  diverticulosis often has no symptoms, health care providers often discover the condition during an exam for other colon problems. In many cases, a health care provider will diagnose diverticulosis while using a flexible scope to examine the colon (colonoscopy). TREATMENT  If you have never developed an infection related to diverticulosis, you may not need treatment. If you have had an infection before, treatment may include:  Eating more fruits, vegetables, and  grains.  Taking a fiber supplement.  Taking a live bacteria supplement (probiotic).  Taking medicine to relax your colon. HOME CARE INSTRUCTIONS   Drink at least 6-8 glasses of water each day to prevent constipation.  Try not to strain when you have a bowel movement.  Keep all follow-up appointments. If you have had an infection before:  Increase the fiber in your diet as directed by your health care provider or dietitian.  Take a dietary fiber supplement if your health care provider approves.  Only take medicines as directed by your health care provider. SEEK MEDICAL CARE IF:   You have abdominal pain.  You have bloating.  You have cramps.  You have not gone to the bathroom in 3 days. SEEK IMMEDIATE MEDICAL CARE IF:   Your pain gets worse.  Yourbloating becomes very bad.  You have a fever or chills, and your symptoms suddenly get worse.  You begin vomiting.  You have bowel movements that are bloody or black. MAKE SURE YOU:  Understand these instructions.  Will watch your condition.  Will get help right away if you are not doing well or get worse. Document Released: 12/05/2003 Document Revised: 03/14/2013 Document Reviewed: 02/01/2013 [facility] Patient Information 2015 [place], [place]. This information is not intended to replace advice given to you by your health care provider. Make sure you discuss any questions you have with your health care provider.

## 2014-04-26 NOTE — Transfer of Care (Signed)
Immediate Anesthesia Transfer of Care Note  Patient: [name]  Procedure(s) Performed: Procedure(s) with comments: COLONOSCOPY WITH PROPOFOL (N/A) - 0939 in cecum, total withdrawal time, 10 min  Patient Location: PACU  Anesthesia Type:MAC  Level of Consciousness: awake and patient cooperative  Airway & Oxygen Therapy: Patient Spontanous Breathing  Post-op Assessment: Report given to RN, Post -op Vital signs reviewed and stable and Patient moving all extremities  Post vital signs: Reviewed and stable    Complications: No apparent anesthesia complications

## 2014-04-26 NOTE — Anesthesia Preprocedure Evaluation (Signed)
Anesthesia Evaluation  Patient identified by MRN, date of birth, ID band Patient awake    Reviewed: Allergy & Precautions, NPO status , Patient's Chart, lab work & pertinent test results  Airway Mallampati: II  TM Distance: >3 FB     Dental  (+) Edentulous Upper, Partial Lower   Pulmonary  breath sounds clear to auscultation        Cardiovascular Rhythm:Regular Rate:Normal     Neuro/Psych  Neuromuscular disease    GI/Hepatic GERD-  Medicated and Controlled,(+) Hepatitis -, C  Endo/Other  Hypothyroidism   Renal/GU      Musculoskeletal  (+) Fibromyalgia -  Abdominal   Peds  Hematology   Anesthesia Other Findings Chronic LBP   Reproductive/Obstetrics                             Anesthesia Physical Anesthesia Plan  ASA: III  Anesthesia Plan: MAC   Post-op Pain Management:    Induction: Intravenous  Airway Management Planned: Simple Face Mask  Additional Equipment:   Intra-op Plan:   Post-operative Plan:   Informed Consent: I have reviewed the patients History and Physical, chart, labs and discussed the procedure including the risks, benefits and alternatives for the proposed anesthesia with the patient or authorized representative who has indicated his/her understanding and acceptance.     Plan Discussed with:   Anesthesia Plan Comments:         Anesthesia Quick Evaluation

## 2014-04-26 NOTE — Anesthesia Postprocedure Evaluation (Signed)
  Anesthesia Post-op Note  Patient: [name]  Procedure(s) Performed: Procedure(s) with comments: COLONOSCOPY WITH PROPOFOL (N/A) - 0939 in cecum, total withdrawal time, 10 min  Patient Location: PACU  Anesthesia Type:MAC  Level of Consciousness: awake and alert   Airway and Oxygen Therapy: Patient Spontanous Breathing  Post-op Pain: none  Post-op Assessment: Post-op Vital signs reviewed, Patient's Cardiovascular Status Stable, Respiratory Function Stable, Patent Airway and No signs of Nausea or vomiting  Post-op Vital Signs: Reviewed and stable Complications: No apparent anesthesia complications

## 2014-04-26 NOTE — Interval H&P Note (Signed)
History and Physical Interval Note:  04/26/2014 8:56 AM  [name]  has presented today for surgery, with the diagnosis of screening colonoscopy  The various methods of treatment have been discussed with the patient and family. After consideration of risks, benefits and other options for treatment, the patient has consented to  Procedure(s) with comments: COLONOSCOPY WITH PROPOFOL (N/A) - 900am as a surgical intervention .  The patient's history has been reviewed, patient examined, no change in status, stable for surgery.  I have reviewed the patient's chart and labs.  Questions were answered to the patient's satisfaction.     Eula Listenobert Rourk  Difficult/ incomplete colonoscopy 2006. Redundant sigmoid colon on CT. Screening examination today.The risks, benefits, limitations, alternatives and imponderables have been reviewed with the patient. Questions have been answered. All parties are agreeable.

## 2014-04-26 NOTE — Interval H&P Note (Signed)
History and Physical Interval Note:  04/26/2014 8:10 AM  [name]  has presented today for surgery, with the diagnosis of screening colonoscopy  The various methods of treatment have been discussed with the patient and family. After consideration of risks, benefits and other options for treatment, the patient has consented to  Procedure(s) with comments: COLONOSCOPY WITH PROPOFOL (N/A) - 900am as a surgical intervention .  The patient's history has been reviewed, patient examined, no change in status, stable for surgery.  I have reviewed the patient's chart and labs.  Questions were answered to the patient's satisfaction.     Jeremi Losito   No change. History of failed colonoscopy 2006. Air-contrast barium enema indicated a redundant sigmoid colon.The risks, benefits, limitations, alternatives and imponderables have been reviewed with the patient. Questions have been answered. All parties are agreeable.

## 2014-04-27 ENCOUNTER — Encounter (HOSPITAL_COMMUNITY): Payer: Self-pay | Admitting: Internal Medicine

## 2014-04-30 DIAGNOSIS — Z1211 Encounter for screening for malignant neoplasm of colon: Secondary | ICD-10-CM | POA: Insufficient documentation

## 2014-12-06 ENCOUNTER — Other Ambulatory Visit (HOSPITAL_COMMUNITY): Payer: Self-pay | Admitting: Internal Medicine

## 2014-12-06 DIAGNOSIS — Z1231 Encounter for screening mammogram for malignant neoplasm of breast: Secondary | ICD-10-CM

## 2015-01-14 ENCOUNTER — Ambulatory Visit (HOSPITAL_COMMUNITY)
Admission: RE | Admit: 2015-01-14 | Discharge: 2015-01-14 | Disposition: A | Discharge status: Home / Self Care | Payer: Medicare Other | Source: Ambulatory Visit | Attending: Internal Medicine | Admitting: Internal Medicine

## 2015-01-14 DIAGNOSIS — Z1231 Encounter for screening mammogram for malignant neoplasm of breast: Secondary | ICD-10-CM | POA: Insufficient documentation

## 2015-09-24 ENCOUNTER — Emergency Department (HOSPITAL_COMMUNITY): Payer: Medicare Other

## 2015-09-24 ENCOUNTER — Observation Stay (HOSPITAL_COMMUNITY)
Admission: EM | Admit: 2015-09-24 | Discharge: 2015-09-25 | Disposition: A | Discharge status: Home / Self Care | Payer: Medicare Other | Attending: Family Medicine | Admitting: Family Medicine

## 2015-09-24 ENCOUNTER — Encounter (HOSPITAL_COMMUNITY): Payer: Self-pay | Admitting: Emergency Medicine

## 2015-09-24 DIAGNOSIS — M545 Low back pain, unspecified: Secondary | ICD-10-CM | POA: Diagnosis present

## 2015-09-24 DIAGNOSIS — K219 Gastro-esophageal reflux disease without esophagitis: Secondary | ICD-10-CM | POA: Diagnosis present

## 2015-09-24 DIAGNOSIS — E039 Hypothyroidism, unspecified: Secondary | ICD-10-CM | POA: Diagnosis present

## 2015-09-24 DIAGNOSIS — M797 Fibromyalgia: Secondary | ICD-10-CM | POA: Diagnosis present

## 2015-09-24 DIAGNOSIS — R404 Transient alteration of awareness: Secondary | ICD-10-CM | POA: Diagnosis not present

## 2015-09-24 DIAGNOSIS — R5382 Chronic fatigue, unspecified: Secondary | ICD-10-CM | POA: Diagnosis present

## 2015-09-24 DIAGNOSIS — R410 Disorientation, unspecified: Secondary | ICD-10-CM

## 2015-09-24 DIAGNOSIS — Z791 Long term (current) use of non-steroidal anti-inflammatories (NSAID): Secondary | ICD-10-CM | POA: Insufficient documentation

## 2015-09-24 DIAGNOSIS — A419 Sepsis, unspecified organism: Principal | ICD-10-CM | POA: Insufficient documentation

## 2015-09-24 DIAGNOSIS — Z7982 Long term (current) use of aspirin: Secondary | ICD-10-CM | POA: Diagnosis not present

## 2015-09-24 DIAGNOSIS — Z79899 Other long term (current) drug therapy: Secondary | ICD-10-CM | POA: Diagnosis not present

## 2015-09-24 DIAGNOSIS — R4182 Altered mental status, unspecified: Secondary | ICD-10-CM | POA: Diagnosis present

## 2015-09-24 LAB — CBC WITH DIFFERENTIAL/PLATELET
Basophils Absolute: 0 10*3/uL (ref 0.0–0.1)
Basophils Relative: 0 %
Eosinophils Absolute: 0 10*3/uL (ref 0.0–0.7)
Eosinophils Relative: 0 %
HCT: 39.7 % (ref 36.0–46.0)
Hemoglobin: 13.3 g/dL (ref 12.0–15.0)
Lymphocytes Relative: 9 %
Lymphs Abs: 1.2 10*3/uL (ref 0.7–4.0)
MCH: 32.3 pg (ref 26.0–34.0)
MCHC: 33.5 g/dL (ref 30.0–36.0)
MCV: 96.4 fL (ref 78.0–100.0)
Monocytes Absolute: 0.7 10*3/uL (ref 0.1–1.0)
Monocytes Relative: 5 %
Neutro Abs: 11.8 10*3/uL — ABNORMAL HIGH (ref 1.7–7.7)
Neutrophils Relative %: 86 %
Platelets: 293 10*3/uL (ref 150–400)
RBC: 4.12 MIL/uL (ref 3.87–5.11)
RDW: 13.2 % (ref 11.5–15.5)
WBC: 13.8 10*3/uL — ABNORMAL HIGH (ref 4.0–10.5)

## 2015-09-24 LAB — URINALYSIS, ROUTINE W REFLEX MICROSCOPIC
Bilirubin Urine: NEGATIVE
Glucose, UA: NEGATIVE mg/dL
Hgb urine dipstick: NEGATIVE
Ketones, ur: NEGATIVE mg/dL
Leukocytes, UA: NEGATIVE
Nitrite: NEGATIVE
Protein, ur: NEGATIVE mg/dL
Specific Gravity, Urine: 1.01 (ref 1.005–1.030)
pH: 7 (ref 5.0–8.0)

## 2015-09-24 LAB — COMPREHENSIVE METABOLIC PANEL
ALT: 32 U/L (ref 14–54)
AST: 40 U/L (ref 15–41)
Albumin: 4.5 g/dL (ref 3.5–5.0)
Alkaline Phosphatase: 44 U/L (ref 38–126)
Anion gap: 7 (ref 5–15)
BUN: 17 mg/dL (ref 6–20)
CO2: 25 mmol/L (ref 22–32)
Calcium: 9.8 mg/dL (ref 8.9–10.3)
Chloride: 104 mmol/L (ref 101–111)
Creatinine, Ser: 0.76 mg/dL (ref 0.44–1.00)
GFR calc Af Amer: >60 mL/min (ref >60)
GFR calc non Af Amer: >60 mL/min (ref >60)
Glucose, Bld: 107 mg/dL — ABNORMAL HIGH (ref 65–99)
Potassium: 4 mmol/L (ref 3.5–5.1)
Sodium: 136 mmol/L (ref 135–145)
Total Bilirubin: 0.8 mg/dL (ref 0.3–1.2)
Total Protein: 7.9 g/dL (ref 6.5–8.1)

## 2015-09-24 LAB — I-STAT CG4 LACTIC ACID, ED
Lactic Acid, Venous: 2.27 mmol/L (ref 0.5–1.9)
Lactic Acid, Venous: 1.82 mmol/L (ref 0.5–1.9)

## 2015-09-24 LAB — TSH: TSH: 0.833 u[IU]/mL (ref 0.350–4.500)

## 2015-09-24 LAB — CBG MONITORING, ED: Glucose-Capillary: 111 mg/dL — ABNORMAL HIGH (ref 65–99)

## 2015-09-24 MED ORDER — SODIUM CHLORIDE 0.9 % IV BOLUS (SEPSIS)
1000.0000 mL | Freq: Once | INTRAVENOUS | Status: AC
  Administered 2015-09-24: 1000 mL via INTRAVENOUS

## 2015-09-24 MED ORDER — ENOXAPARIN SODIUM 40 MG/0.4ML ~~LOC~~ SOLN
40.0000 mg | SUBCUTANEOUS | Status: DC
  Administered 2015-09-24: 40 mg via SUBCUTANEOUS
  Filled 2015-09-24: qty 0.4

## 2015-09-24 MED ORDER — PANTOPRAZOLE SODIUM 40 MG PO TBEC
40.0000 mg | DELAYED_RELEASE_TABLET | Freq: Every day | ORAL | Status: DC
  Administered 2015-09-24 – 2015-09-25 (×2): 40 mg via ORAL
  Filled 2015-09-24 (×2): qty 1

## 2015-09-24 MED ORDER — PIPERACILLIN-TAZOBACTAM 3.375 G IVPB 30 MIN
3.3750 g | Freq: Once | INTRAVENOUS | Status: AC
  Administered 2015-09-24: 3.375 g via INTRAVENOUS
  Filled 2015-09-24: qty 50

## 2015-09-24 MED ORDER — STROKE: EARLY STAGES OF RECOVERY BOOK
Freq: Once | Status: AC
  Administered 2015-09-25: 10:00:00
  Filled 2015-09-24: qty 1

## 2015-09-24 MED ORDER — FENTANYL CITRATE (PF) 100 MCG/2ML IJ SOLN: 25.0000 ug | Freq: Once | INTRAMUSCULAR | Status: DC

## 2015-09-24 MED ORDER — FENOFIBRATE 160 MG PO TABS
160.0000 mg | ORAL_TABLET | Freq: Every day | ORAL | Status: DC
  Administered 2015-09-24 – 2015-09-25 (×2): 160 mg via ORAL
  Filled 2015-09-24 (×2): qty 1

## 2015-09-24 MED ORDER — SODIUM CHLORIDE 0.9 % IV BOLUS (SEPSIS)
250.0000 mL | Freq: Once | INTRAVENOUS | Status: AC
  Administered 2015-09-24: 250 mL via INTRAVENOUS

## 2015-09-24 MED ORDER — LEVOTHYROXINE SODIUM 88 MCG PO TABS
88.0000 ug | ORAL_TABLET | Freq: Every day | ORAL | Status: DC
  Administered 2015-09-25: 88 ug via ORAL
  Filled 2015-09-24: qty 1

## 2015-09-24 MED ORDER — SENNOSIDES-DOCUSATE SODIUM 8.6-50 MG PO TABS: 1.0000 | ORAL_TABLET | Freq: Every evening | ORAL | Status: DC | PRN

## 2015-09-24 MED ORDER — SODIUM CHLORIDE 0.9 % IV SOLN
INTRAVENOUS | Status: DC
  Administered 2015-09-24: 21:00:00 via INTRAVENOUS

## 2015-09-24 MED ORDER — MULTIVITAMINS PO CAPS: 1.0000 | ORAL_CAPSULE | Freq: Every day | ORAL | Status: DC

## 2015-09-24 MED ORDER — VANCOMYCIN HCL IN DEXTROSE 1-5 GM/200ML-% IV SOLN
1000.0000 mg | Freq: Once | INTRAVENOUS | Status: AC
  Administered 2015-09-24: 1000 mg via INTRAVENOUS
  Filled 2015-09-24: qty 200

## 2015-09-24 MED ORDER — PIPERACILLIN-TAZOBACTAM 3.375 G IVPB
3.3750 g | Freq: Three times a day (TID) | INTRAVENOUS | Status: DC
  Administered 2015-09-24 – 2015-09-25 (×2): 3.375 g via INTRAVENOUS
  Filled 2015-09-24 (×2): qty 50

## 2015-09-24 MED ORDER — ZOLPIDEM TARTRATE 5 MG PO TABS
5.0000 mg | ORAL_TABLET | Freq: Every evening | ORAL | Status: DC | PRN
  Administered 2015-09-24: 5 mg via ORAL
  Filled 2015-09-24: qty 1

## 2015-09-24 MED ORDER — ADULT MULTIVITAMIN W/MINERALS CH
1.0000 | ORAL_TABLET | Freq: Every day | ORAL | Status: DC
Start: 2015-09-24 — End: 2015-09-25
  Administered 2015-09-24 – 2015-09-25 (×2): 1 via ORAL
  Filled 2015-09-24 (×2): qty 1

## 2015-09-24 MED ORDER — ASPIRIN EC 81 MG PO TBEC
81.0000 mg | DELAYED_RELEASE_TABLET | Freq: Every day | ORAL | Status: DC
  Administered 2015-09-24 – 2015-09-25 (×2): 81 mg via ORAL
  Filled 2015-09-24 (×4): qty 1

## 2015-09-24 MED ORDER — PREGABALIN 75 MG PO CAPS
200.0000 mg | ORAL_CAPSULE | Freq: Three times a day (TID) | ORAL | Status: DC
  Administered 2015-09-24 – 2015-09-25 (×2): 200 mg via ORAL
  Filled 2015-09-24 (×3): qty 1

## 2015-09-24 MED ORDER — VANCOMYCIN HCL IN DEXTROSE 1-5 GM/200ML-% IV SOLN
1000.0000 mg | Freq: Two times a day (BID) | INTRAVENOUS | Status: DC
  Administered 2015-09-24: 1000 mg via INTRAVENOUS
  Filled 2015-09-24: qty 200

## 2015-09-24 MED ORDER — DULOXETINE HCL 60 MG PO CPEP
60.0000 mg | ORAL_CAPSULE | Freq: Every day | ORAL | Status: DC
  Administered 2015-09-24 – 2015-09-25 (×2): 60 mg via ORAL
  Filled 2015-09-24 (×2): qty 1

## 2015-09-24 NOTE — H&P (Signed)
History and Physical    [name] ZOX:[id number] DOB: 05-30-1940 DOA: 09/24/2015  PCP: Dwana Melena, MD Consultants:  None Patient coming from: Home  Chief Complaint: AMS  HPI: [name] is a 75 y.o. female with medical history significant of fibromylagia, hypothyroidism presenting with altered mental status.  The patient reports that she noticed chills this AM upon awakening and she is rarely cold; she tried everything to get warm.  When she attempted to get out of bed, patient reports that she was very very dizzy, unable to walk across the floor.  Difficulty balancing, jumbled speech.  About noon her husband noticed that she was freezing despite a warm house, unable to walk, very confused, talking about things that didn't make sense, no slurring of speech.  No difficulty swallowing.  Has not eaten today.  Ongoing headache at the top of her headache and along the occiput.  No temperature taken at home but husband reports she felt warm.  Confusion resolved while in the ER.  She reports that she is still very, very tired.  Family does report that she has had some gradual confusion over the past few weeks.    ED Course: With mild tachycardia and tachypnea, sepsis protocol was initiated.  She was given IVF with resolution of her AMS.  Head CT was artifactual.  UA and CXR negative but WBC 13, lactate 2.27.  Patient given Zosyn and Vanc.  MRI not available today.  Review of Systems: As per HPI; otherwise 10 point review of systems reviewed and negative.   Ambulatory Status: ambulatory  Past Medical History  Diagnosis Date  . Hypothyroid   . Hyperglycemia   . Diverticulosis   . GERD (gastroesophageal reflux disease) 2006    EGD by Dr. Lovell Sheehan  . Fibromyalgia   . S/P colonoscopy 2006    Dr. Lovell Sheehan  . Fatty liver   . Hepatitis C antibody test positive     negative pcr  . Chronic back pain   . Hemorrhage after delivery of fetus     Past Surgical History  Procedure Laterality Date    . Cholecystectomy  1998  . Total abdominal hysterectomy w/ bilateral salpingoophorectomy    . Cataracts    . Lumbar disc surgery    . Appendectomy    . Colonoscopy with propofol N/A 04/26/2014    Procedure: COLONOSCOPY WITH PROPOFOL;  Surgeon: Corbin Ade, MD;  Location: [facility];  Service: Endoscopy;  Laterality: N/A;  0939 in cecum, total withdrawal time, 10 min    Social History   Social History  . Marital Status: Married    Spouse Name: N/A  . Number of Children: 3  . Years of Education: N/A   Occupational History  . retired     [employer]   Social History Main Topics  . Smoking status: Never Smoker   . Smokeless tobacco: Not on file  . Alcohol Use: No  . Drug Use: No  . Sexual Activity: No   Other Topics Concern  . Not on file   Social History Narrative    Allergies  Allergen Reactions  . Sulfonamide Derivatives     REACTION: unknown reaction    Family History  Problem Relation Age of Onset  . Brain cancer Father   . Coronary artery disease Mother   . Prostate cancer Brother   . Cancer Sister     gyn  . Lung cancer Brother   . Seizures Son   . Pancreatitis  Daughter     ?gallstones  . Colon cancer Neg Hx     Prior to Admission medications   Medication Sig Start Date End Date Taking? Authorizing Provider  aspirin 81 MG tablet Take 81 mg by mouth daily.     Yes Historical Provider, MD  citalopram (CELEXA) 20 MG tablet Take 20 mg by mouth daily.     Yes Historical Provider, MD  Cyanocobalamin (VITAMIN B-12 PO) Take 1 tablet by mouth daily.   Yes Historical Provider, MD  DULoxetine (CYMBALTA) 60 MG capsule Take 60 mg by mouth daily.   Yes Historical Provider, MD  fenofibrate (TRICOR) 145 MG tablet Take 145 mg by mouth daily.   Yes Historical Provider, MD  fish oil-omega-3 fatty acids 1000 MG capsule Take 2 g by mouth daily.     Yes Historical Provider, MD  levothyroxine (SYNTHROID, LEVOTHROID) 88 MCG tablet Take 88 mcg by mouth daily.      Yes Historical Provider, MD  lidocaine (LIDODERM) 5 % Place 3 patches onto the skin at bedtime. Remove & Discard patch within 12 hours or as directed by MD   Yes Historical Provider, MD  Multiple Vitamin (MULTIVITAMIN) capsule Take 1 capsule by mouth daily.     Yes Historical Provider, MD  naproxen sodium (ANAPROX) 220 MG tablet Take 220 mg by mouth daily as needed (pain).   Yes Historical Provider, MD  omeprazole (PRILOSEC) 20 MG capsule Take 20 mg by mouth 2 (two) times daily.    Yes Historical Provider, MD  pregabalin (LYRICA) 200 MG capsule Take 200 mg by mouth 3 (three) times daily.    Yes Historical Provider, MD  zolpidem (AMBIEN CR) 12.5 MG CR tablet Take 12.5 mg by mouth.    Yes Historical Provider, MD    Physical Exam: Filed Vitals:   09/24/15 1726 09/24/15 1730 09/24/15 1800 09/24/15 1830  BP:  138/71 149/75 143/64  Pulse:  93 95 91  Temp: 99.1 F (37.3 C)     TempSrc:      Resp:  19 16 20   Height:      Weight:      SpO2:  95% 98% 96%     General: Appears calm and comfortable and is NAD; able to answer questions appropriately and not confused at the time of my evaluation Eyes:  PERRL, EOMI, normal lids, iris ENT:  grossly normal hearing, lips & tongue, mmm Neck:  no LAD, masses or thyromegaly Cardiovascular:  RRR, with 2/6 systolic murmur (patient denies h/o murmur), no r/g. No LE edema.  Respiratory:  CTA bilaterally, no w/r/r. Normal respiratory effort. Abdomen:  soft, ntnd, NABS Skin:  no rash or induration seen on limited exam Musculoskeletal:  grossly normal tone BUE/BLE, good ROM, no bony abnormality Psychiatric:  grossly normal mood and affect, speech fluent and appropriate, AOx3 Neurologic:  CN 2-12 grossly intact, moves all extremities in coordinated fashion, sensation intact  Labs on Admission: I have personally reviewed following labs and imaging studies  CBC:  Recent Labs Lab 09/24/15 1410  WBC 13.8*  NEUTROABS 11.8*  HGB 13.3  HCT 39.7  MCV  96.4  PLT 293   Basic Metabolic Panel:  Recent Labs Lab 09/24/15 1410  NA 136  K 4.0  CL 104  CO2 25  GLUCOSE 107*  BUN 17  CREATININE 0.76  CALCIUM 9.8   GFR: Estimated Creatinine Clearance: 60.2 mL/min (by C-G formula based on Cr of 0.76). Liver Function Tests:  Recent Labs Lab 09/24/15 1410  AST  40  ALT 32  ALKPHOS 44  BILITOT 0.8  PROT 7.9  ALBUMIN 4.5   No results for input(s): LIPASE, AMYLASE in the last 168 hours. No results for input(s): AMMONIA in the last 168 hours. Coagulation Profile: No results for input(s): INR, PROTIME in the last 168 hours. Cardiac Enzymes: No results for input(s): CKTOTAL, CKMB, CKMBINDEX, TROPONINI in the last 168 hours. BNP (last 3 results) No results for input(s): PROBNP in the last 8760 hours. HbA1C: No results for input(s): HGBA1C in the last 72 hours. CBG:  Recent Labs Lab 09/24/15 1351  GLUCAP 111*   Lipid Profile: No results for input(s): CHOL, HDL, LDLCALC, TRIG, CHOLHDL, LDLDIRECT in the last 72 hours. Thyroid Function Tests: No results for input(s): TSH, T4TOTAL, FREET4, T3FREE, THYROIDAB in the last 72 hours. Anemia Panel: No results for input(s): VITAMINB12, FOLATE, FERRITIN, TIBC, IRON, RETICCTPCT in the last 72 hours. Urine analysis:    Component Value Date/Time   COLORURINE YELLOW 09/24/2015 1434   APPEARANCEUR CLEAR 09/24/2015 1434   LABSPEC 1.010 09/24/2015 1434   PHURINE 7.0 09/24/2015 1434   GLUCOSEU NEGATIVE 09/24/2015 1434   HGBUR NEGATIVE 09/24/2015 1434   BILIRUBINUR NEGATIVE 09/24/2015 1434   KETONESUR NEGATIVE 09/24/2015 1434   PROTEINUR NEGATIVE 09/24/2015 1434   UROBILINOGEN 0.2 02/24/2008 1101   NITRITE NEGATIVE 09/24/2015 1434   LEUKOCYTESUR NEGATIVE 09/24/2015 1434    Creatinine Clearance: Estimated Creatinine Clearance: 60.2 mL/min (by C-G formula based on Cr of 0.76).  Sepsis Labs: @LABRCNTIP (procalcitonin:4,lacticidven:4) )No results found for this or any previous visit  (from the past 240 hour(s)).   Radiological Exams on Admission: Dg Chest 2 View  09/24/2015  CLINICAL DATA:  Confusion EXAM: CHEST  2 VIEW COMPARISON:  July 08, 2006 FINDINGS: There is no edema or consolidation. Heart size and pulmonary vascularity are normal. No adenopathy. There is atherosclerotic calcification in the aorta. No bone lesions. IMPRESSION: Aortic atherosclerosis.  No edema or consolidation. Electronically Signed   By: Bretta BangWilliam  Woodruff III M.D.   On: 09/24/2015 14:52   Ct Head Wo Contrast  09/24/2015  CLINICAL DATA:  Dizziness EXAM: CT HEAD WITHOUT CONTRAST TECHNIQUE: Contiguous axial images were obtained from the base of the skull through the vertex without intravenous contrast. COMPARISON:  None. FINDINGS: Motion artifact severely limits the exam. Significant hemorrhage may be missed by this study. There is no obvious acute hemorrhage or midline shift. Chronic ischemic changes in the periventricular white matter are noted. There is no obvious skull fracture. IMPRESSION: Very limited examination as described above. No obvious acute intracranial pathology. Electronically Signed   By: Jolaine ClickArthur  Hoss M.D.   On: 09/24/2015 14:46    EKG: Independently reviewed.  Sinus tachycardia with a few PVCs;  No significant change since last tracing   Assessment/Plan Principal Problem:   Altered mental status Active Problems:   Hypothyroidism   GERD   Fibromyalgia   Chronic fatigue   Lumbago    Altered mental status -Did not have rectal temp in ER and oral temp was normal, has ongoing tachycardia, other symptoms resolved (no longer confused, no tachypnea at the time of my evaluation. -Elevated WBC count and initial lactate but the lactate was not markedly elevated and normalized -Uncertain diagnosis.  Most likely diagnosis is either infectious or TIA. -Blood cultures drawn in ER and pending; urinalysis was negative and urine culture is pending; CXR was negative -With headache, dizziness,  and elevated WBC count, LP was considered to completed infection work-up.  However, patient's AMS resolved  completely with IVF and she did not have neck stiffness or other findings concerning for meningitis.  LP discussed with patient/family and we are in agreement that this is not necessary at this time but would be considered for progression of symptoms. -Will continue Vanc/Zosyn (dosing per pharmacy) while awaiting culture results. -Meanwhile, TIA also remains in DDx -ABCD2 score is 4, indicating moderate risk (age, speech disturbance, and symptom duration).  Will proceed with carotids, MRI/MRA, Echo (has possible new-onset murmur, so this is reasonable for this reason too). -ASA 81 mg daily, check lipids and A1c for risk stratification -SLP/PT/OT evaluations  Hypothyroidism No recent testing in our system.  Will order.  Continue other home medications as per med rec note.  DVT prophylaxis: Lovenox  Code Status: DNR - confirmed with patient/family Family Communication: husband and daughter present at bedside and in agreement with plan Disposition Plan:  Home once clinically improved Consults called: None Admission status: Observation   Jonah BlueJennifer Asianae Minkler MD [facility]  If 7PM-7AM, please contact night-coverage [email or web address] Password [email or web address]  09/24/2015, 7:05 PM

## 2015-09-24 NOTE — ED Notes (Signed)
MD at bedside. 

## 2015-09-24 NOTE — Progress Notes (Signed)
Pharmacy Antibiotic Note  Glennon MacKatie E Wiley is a 75 y.o. female admitted on 09/24/2015 with sepsis.  [facility]  Plan:  Vancomycin 1000mg  IV q12h, pt not fully loaded so will give 2nd dose early Check trough at steady state Zosyn 3.375gm IV q8h, EID Monitor labs, renal fxn, progress and c/s Deescalate ABX when improved / appropriate.    Height: 5\' 2"  (157.5 cm) Weight: 170 lb (77.111 kg) IBW/kg (Calculated) : 50.1  Temp (24hrs), Avg:99.1 F (37.3 C), Min:99.1 F (37.3 C), Max:99.1 F (37.3 C)   Recent Labs Lab 09/24/15 1409 09/24/15 1410  WBC  --  13.8*  CREATININE  --  0.76  LATICACIDVEN 2.27*  --     Estimated Creatinine Clearance: 60.2 mL/min (by C-G formula based on Cr of 0.76).    Allergies  Allergen Reactions  . Sulfonamide Derivatives     REACTION: unknown reaction    Antimicrobials this admission: vancomycin 7/4 >>  zosyn 7/4 >>   Dose adjustments this admission: n/a  No results found for this or any previous visit (from the past 240 hour(s)).  Thank you for allowing pharmacy to be a part of this patient's care.  Valrie HartHall, Geoffrey Hynes A 09/24/2015 3:44 PM

## 2015-09-24 NOTE — ED Notes (Signed)
Pt cleared by MD to have food.

## 2015-09-24 NOTE — ED Provider Notes (Signed)
CSN: [id number]     Arrival date & time 09/24/15  1344 History  By signing my name below, I, Emmanuella Mensah, attest that this documentation has been prepared under the direction and in the presence of Vanetta Mulders, MD. Electronically Signed: Angelene Giovanni, ED Scribe. 09/24/2015. 2:06 PM.    Chief Complaint  Patient presents with  . Altered Mental Status   Patient is a 75 y.o. female presenting with altered mental status. The history is provided by the spouse and the patient. No language interpreter was used.  Altered Mental Status Presenting symptoms: behavior changes   Severity:  Moderate Most recent episode:  Today Episode history:  Single Timing:  Rare Progression:  Resolved Chronicity:  New  HPI Comments: Level 5 Caveat due to altered mental status [name] is a 75 y.o. female who presents to the Emergency Department for evaluation for altered mental status that occurred at 12 pm today. Pt's husband explains that pt was trying to put gloves on her fee, talking to her grandchildren even though no one was there, and she was unable to to recognize him. He adds that pt was having difficulty finding her words and the last time he saw her normal was at 11 pm. Pt reports associated chills, weakness in right leg, and weakness in right arm. She denies any chest pain, fever, sore throat, or cough.    Past Medical History  Diagnosis Date  . Hypothyroid   . Hyperglycemia   . Diverticulosis   . GERD (gastroesophageal reflux disease) 2006    EGD by Dr. Lovell Sheehan  . Fibromyalgia   . S/P colonoscopy 2006    Dr. Lovell Sheehan  . Fatty liver   . Hepatitis C antibody test positive     negative pcr  . Chronic back pain   . Hemorrhage after delivery of fetus    Past Surgical History  Procedure Laterality Date  . Cholecystectomy  1998  . Total abdominal hysterectomy w/ bilateral salpingoophorectomy    . Cataracts    . Lumbar disc surgery    . Appendectomy    . Colonoscopy with  propofol N/A 04/26/2014    Procedure: COLONOSCOPY WITH PROPOFOL;  Surgeon: Corbin Ade, MD;  Location: [facility];  Service: Endoscopy;  Laterality: N/A;  0939 in cecum, total withdrawal time, 10 min   Family History  Problem Relation Age of Onset  . Brain cancer Father   . Coronary artery disease Mother   . Prostate cancer Brother   . Cancer Sister     gyn  . Lung cancer Brother   . Seizures Son   . Pancreatitis Daughter     ?gallstones  . Colon cancer Neg Hx    Social History  Substance Use Topics  . Smoking status: Never Smoker   . Smokeless tobacco: None  . Alcohol Use: No   OB History    No data available     Review of Systems  Unable to perform ROS: Mental status change      Allergies  Sulfonamide derivatives  Home Medications   Prior to Admission medications   Medication Sig Start Date End Date Taking? Authorizing Provider  aspirin 81 MG tablet Take 81 mg by mouth daily.     Yes Historical Provider, MD  citalopram (CELEXA) 20 MG tablet Take 20 mg by mouth daily.     Yes Historical Provider, MD  Cyanocobalamin (VITAMIN B-12 PO) Take 1 tablet by mouth daily.   Yes Historical Provider, MD  DULoxetine (  CYMBALTA) 60 MG capsule Take 60 mg by mouth daily.   Yes Historical Provider, MD  fenofibrate (TRICOR) 145 MG tablet Take 145 mg by mouth daily.   Yes Historical Provider, MD  fish oil-omega-3 fatty acids 1000 MG capsule Take 2 g by mouth daily.     Yes Historical Provider, MD  levothyroxine (SYNTHROID, LEVOTHROID) 88 MCG tablet Take 88 mcg by mouth daily.     Yes Historical Provider, MD  lidocaine (LIDODERM) 5 % Place 3 patches onto the skin at bedtime. Remove & Discard patch within 12 hours or as directed by MD   Yes Historical Provider, MD  Multiple Vitamin (MULTIVITAMIN) capsule Take 1 capsule by mouth daily.     Yes Historical Provider, MD  naproxen sodium (ANAPROX) 220 MG tablet Take 220 mg by mouth daily as needed (pain).   Yes Historical Provider, MD  omeprazole  (PRILOSEC) 20 MG capsule Take 20 mg by mouth 2 (two) times daily.    Yes Historical Provider, MD  pregabalin (LYRICA) 200 MG capsule Take 200 mg by mouth 3 (three) times daily.    Yes Historical Provider, MD  zolpidem (AMBIEN CR) 12.5 MG CR tablet Take 12.5 mg by mouth.    Yes Historical Provider, MD   BP 136/72 mmHg  Pulse 91  Temp(Src) 99.1 F (37.3 C) (Oral)  Resp 14  Ht 5\' 2"  (1.575 m)  Wt 77.111 kg  BMI 31.09 kg/m2  SpO2 96% Physical Exam  Constitutional: She is oriented to person, place, and time. She appears well-developed and well-nourished.  HENT:  Head: Normocephalic and atraumatic.  Mouth/Throat: Oropharynx is clear and moist.  Eyes: Pupils are equal, round, and reactive to light.  Sclera clear Eyes track normal  Cardiovascular: Tachycardia present.   Pulmonary/Chest: Effort normal and breath sounds normal. No respiratory distress. She has no wheezes. She has no rales.  Lungs clear bilaterally  Abdominal: Bowel sounds are normal. There is no tenderness.  Neurological: She is alert and oriented to person, place, and time.  Skin: Skin is warm and dry.  Psychiatric: She has a normal mood and affect.  Nursing note and vitals reviewed.   ED Course  Procedures (including critical care time) DIAGNOSTIC STUDIES: Oxygen Saturation is 100% on RA, normal by my interpretation.    COORDINATION OF CARE: 2:06 PM- Pt advised of plan for treatment and pt agrees.    Labs Review Labs Reviewed  COMPREHENSIVE METABOLIC PANEL - Abnormal; Notable for the following:    Glucose, Bld 107 (*)    All other components within normal limits  CBC WITH DIFFERENTIAL/PLATELET - Abnormal; Notable for the following:    WBC 13.8 (*)    Neutro Abs 11.8 (*)    All other components within normal limits  CBG MONITORING, ED - Abnormal; Notable for the following:    Glucose-Capillary 111 (*)    All other components within normal limits  I-STAT CG4 LACTIC ACID, ED - Abnormal; Notable for the  following:    Lactic Acid, Venous 2.27 (*)    All other components within normal limits  CULTURE, BLOOD (ROUTINE X 2)  CULTURE, BLOOD (ROUTINE X 2)  URINE CULTURE  URINALYSIS, ROUTINE W REFLEX MICROSCOPIC (NOT [facility])  BASIC METABOLIC PANEL  I-STAT CG4 LACTIC ACID, ED   Results for orders placed or performed during the hospital encounter of 09/24/15  Comprehensive metabolic panel  Result Value Ref Range   Sodium 136 135 - 145 mmol/L   Potassium 4.0 3.5 - 5.1 mmol/L  Chloride 104 101 - 111 mmol/L   CO2 25 22 - 32 mmol/L   Glucose, Bld 107 (H) 65 - 99 mg/dL   BUN 17 6 - 20 mg/dL   Creatinine, Ser 1.61 0.44 - 1.00 mg/dL   Calcium 9.8 8.9 - 09.6 mg/dL   Total Protein 7.9 6.5 - 8.1 g/dL   Albumin 4.5 3.5 - 5.0 g/dL   AST 40 15 - 41 U/L   ALT 32 14 - 54 U/L   Alkaline Phosphatase 44 38 - 126 U/L   Total Bilirubin 0.8 0.3 - 1.2 mg/dL   GFR calc non Af Amer >60 >60 mL/min   GFR calc Af Amer >60 >60 mL/min   Anion gap 7 5 - 15  CBC WITH DIFFERENTIAL  Result Value Ref Range   WBC 13.8 (H) 4.0 - 10.5 K/uL   RBC 4.12 3.87 - 5.11 MIL/uL   Hemoglobin 13.3 12.0 - 15.0 g/dL   HCT 04.5 40.9 - 81.1 %   MCV 96.4 78.0 - 100.0 fL   MCH 32.3 26.0 - 34.0 pg   MCHC 33.5 30.0 - 36.0 g/dL   RDW 91.4 78.2 - 95.6 %   Platelets 293 150 - 400 K/uL   Neutrophils Relative % 86 %   Neutro Abs 11.8 (H) 1.7 - 7.7 K/uL   Lymphocytes Relative 9 %   Lymphs Abs 1.2 0.7 - 4.0 K/uL   Monocytes Relative 5 %   Monocytes Absolute 0.7 0.1 - 1.0 K/uL   Eosinophils Relative 0 %   Eosinophils Absolute 0.0 0.0 - 0.7 K/uL   Basophils Relative 0 %   Basophils Absolute 0.0 0.0 - 0.1 K/uL  Urinalysis, Routine w reflex microscopic (not at [facility])  Result Value Ref Range   Color, Urine YELLOW YELLOW   APPearance CLEAR CLEAR   Specific Gravity, Urine 1.010 1.005 - 1.030   pH 7.0 5.0 - 8.0   Glucose, UA NEGATIVE NEGATIVE mg/dL   Hgb urine dipstick NEGATIVE NEGATIVE   Bilirubin Urine NEGATIVE NEGATIVE   Ketones,  ur NEGATIVE NEGATIVE mg/dL   Protein, ur NEGATIVE NEGATIVE mg/dL   Nitrite NEGATIVE NEGATIVE   Leukocytes, UA NEGATIVE NEGATIVE  CBG monitoring, ED  Result Value Ref Range   Glucose-Capillary 111 (H) 65 - 99 mg/dL  I-Stat CG4 Lactic Acid, ED  (not at  [facility])  Result Value Ref Range   Lactic Acid, Venous 2.27 (HH) 0.5 - 1.9 mmol/L   Comment NOTIFIED PHYSICIAN   I-Stat CG4 Lactic Acid, ED  (not at  [facility])  Result Value Ref Range   Lactic Acid, Venous 1.82 0.5 - 1.9 mmol/L    Imaging Review Dg Chest 2 View  09/24/2015  CLINICAL DATA:  Confusion EXAM: CHEST  2 VIEW COMPARISON:  July 08, 2006 FINDINGS: There is no edema or consolidation. Heart size and pulmonary vascularity are normal. No adenopathy. There is atherosclerotic calcification in the aorta. No bone lesions. IMPRESSION: Aortic atherosclerosis.  No edema or consolidation. Electronically Signed   By: Bretta Bang III M.D.   On: 09/24/2015 14:52   Ct Head Wo Contrast  09/24/2015  CLINICAL DATA:  Dizziness EXAM: CT HEAD WITHOUT CONTRAST TECHNIQUE: Contiguous axial images were obtained from the base of the skull through the vertex without intravenous contrast. COMPARISON:  None. FINDINGS: Motion artifact severely limits the exam. Significant hemorrhage may be missed by this study. There is no obvious acute hemorrhage or midline shift. Chronic ischemic changes in the periventricular white matter are noted. There is  no obvious skull fracture. IMPRESSION: Very limited examination as described above. No obvious acute intracranial pathology. Electronically Signed   By: Jolaine ClickArthur  Hoss M.D.   On: 09/24/2015 14:46     Vanetta MuldersScott Zephaniah Lubrano, MD haspersonally reviewed and evaluated these images and lab results as part of his medical decision-making.   EKG Interpretation   Date/Time:  Tuesday September 24 2015 13:51:56 EDT Ventricular Rate:  114 PR Interval:    QRS Duration: 73 QT Interval:  324 QTC Calculation: 452 R Axis:   57 Text Interpretation:   Sinus tachycardia Paired ventricular premature  complexes Aberrant conduction of SV complex(es) Low voltage, extremity and  precordial leads Interpretation limited secondary to artifact No  significant change since last tracing Confirmed by Cathi Hazan  MD, Elih Mooney  [phone]([id number]) on 09/24/2015 2:13:03 PM      MDM   Final diagnoses:  Altered mental status, unspecified altered mental status type  Sepsis, due to unspecified organism [facility](HCC)   Patient with some gradual confusion over the past the few weeks. Patient found by her her husband at noon time today after he came in from working [place] the farm very confused. Patient stated that she had fevers feeling and chills this morning. No nausea no vomiting no chest pain no shortness of breath not hurting to be.  Patient was last seen normal at 11:00 PM yesterday  Patient brought in for altered mental status. No documented fever here but temp was 99. Patient was slightly tachycardic on presentation of 109. Respiratory rate was as high as 25. So sepsis, protocol was enacted. Patient was never hypotensive. Patient was not hypoxic.  With fluids and antibiotics patient feels significantly better. Patient's husband feels that her mental status is back to baseline. Patient has had the complaint of a headache on and off all week on the top of her head. Did complain of a headache there today. Showed no signs of any neck stiffness.  Head CT was somewhat marginal due to movement however nothing gross chest x-ray was negative urines normal. White count up a little bit at 13,000.  Patient received Zosyn and bank as per sepsis protocol. I would've ordered MRI to evaluate of the confusion. Patient also had some right-sided leg weakness upon presentation and had some difficulty doing finger little finger to thumb pinching and was weak on the left side with that. Husband also reported that patient has difficulty getting her words out however here she was talking fine. Lactic acid  was slightly elevated at 2.27.  Patient will be evaluated by the hospitalist.   I personally performed the services described in this documentation, which was scribed in my presence. The recorded information has been reviewed and is accurate.      Vanetta MuldersScott Moses Odoherty, MD 09/24/15 1714

## 2015-09-24 NOTE — ED Notes (Signed)
Pt states she woke up this morning with dizziness, trouble drinking water and taking her medications, and some difficulty getting thoughts out.

## 2015-09-24 NOTE — ED Notes (Signed)
I-stat lac 2.27 MD notified

## 2015-09-25 ENCOUNTER — Observation Stay (HOSPITAL_COMMUNITY): Payer: Medicare Other

## 2015-09-25 DIAGNOSIS — G9340 Encephalopathy, unspecified: Secondary | ICD-10-CM | POA: Diagnosis not present

## 2015-09-25 LAB — BASIC METABOLIC PANEL
Anion gap: 5 (ref 5–15)
BUN: 13 mg/dL (ref 6–20)
CO2: 24 mmol/L (ref 22–32)
Calcium: 8.6 mg/dL — ABNORMAL LOW (ref 8.9–10.3)
Chloride: 111 mmol/L (ref 101–111)
Creatinine, Ser: 0.83 mg/dL (ref 0.44–1.00)
GFR calc Af Amer: >60 mL/min (ref >60)
GFR calc non Af Amer: >60 mL/min (ref >60)
Glucose, Bld: 111 mg/dL — ABNORMAL HIGH (ref 65–99)
Potassium: 3.6 mmol/L (ref 3.5–5.1)
Sodium: 140 mmol/L (ref 135–145)

## 2015-09-25 LAB — CBC
HCT: 31.3 % — ABNORMAL LOW (ref 36.0–46.0)
Hemoglobin: 10.3 g/dL — ABNORMAL LOW (ref 12.0–15.0)
MCH: 32 pg (ref 26.0–34.0)
MCHC: 32.9 g/dL (ref 30.0–36.0)
MCV: 97.2 fL (ref 78.0–100.0)
Platelets: 257 10*3/uL (ref 150–400)
RBC: 3.22 MIL/uL — ABNORMAL LOW (ref 3.87–5.11)
RDW: 13.7 % (ref 11.5–15.5)
WBC: 14 10*3/uL — ABNORMAL HIGH (ref 4.0–10.5)

## 2015-09-25 LAB — LIPID PANEL
Cholesterol: 100 mg/dL (ref 0–200)
HDL: 33 mg/dL — ABNORMAL LOW (ref >40)
LDL Cholesterol: 58 mg/dL (ref 0–99)
Total CHOL/HDL Ratio: 3 RATIO
Triglycerides: 46 mg/dL (ref <150)
VLDL: 9 mg/dL (ref 0–40)

## 2015-09-25 NOTE — Discharge Summary (Addendum)
Physician Discharge Summary  Glennon MacKatie E Yzaguirre [id number] DOB: Feb 26, 1941 DOA: 09/24/2015  PCP: [name], MD  Admit date: 09/24/2015 Discharge date: 09/25/2015  Recommendations for Outpatient Follow-up:  1. Follow up with PCP in 1 week 2. Monitor for any recurrence of similar episodes 3. Defer echocardiogram to Dr. Margo AyeHall as an outpatient(Offered to patient but she declined at this time) 4. Normocytic anemia, suspected baseline. Could consider repeat CBC as an outpatient if clinically indicated.  Follow-up Information    Follow up with [name], MD On 10/04/2015.   Specialty:  Internal Medicine   Why:  at 12:40 pm   Contact information:   [address] [place] [address] [phone]      Discharge Diagnoses:  1. Acute encephalopathy 2. Hypothyroidism 3. Normocytic anemia 4. GERD 5. Fibromyalgia 6. Chronic fatigue 7. Lumbago  Discharge Condition: improved Disposition: discharge home  Diet recommendation: regular  Filed Weights   09/24/15 1403 09/24/15 1456 09/24/15 2013  Weight: 72.576 kg (160 lb) 77.111 kg (170 lb) 80.513 kg (177 lb 8 oz)    History of present illness:  75 yof with a hx of fibromyalgia, hypothyroidism, presented with complaints of altered mental status. She reported that she noticed chills the morning of admission upon waking up. She reported no alleviating factors. She stated that once she tried to get out of bed, she became very dizzy, and was unable to ambulate. She also reported jumbled speech. Her husband reported that she was very confused, and unable to walk. She denied any difficulty swallowing. Upon arriving in the ED, her confusion resolved, though she complained of persistent fatigue. Her family reported that she has had some gradual confusion over the past few weeks.   Hospital Course:  Presented with altered mental status, however this resolved in the emergency department, antibiotics were initiated for history of fever and confusion and  modest elevation of lactic acid, however no evidence for sepsis ongoing in antibiotics were discontinued. Patient essentially returned to baseline in the emergency department and observation overnight was unremarkable. No evidence suggest severe illness at this time. Plan outpatient follow-up. Individual issues as below.  1. Acute encephalopathy. Resolved. Etiology unclear. Reported fever and chills at home, however infectious workup negative including normal urinalysis, negative chest x-ray, no localizing symptoms. Symptoms resolved in the emergency department arguing against bacterial infection or severe illness. Differential would include viral illness. Lumbar puncture was appropriately deferred given rapid resolution of symptoms. Initial lactate was modestly elevated but returned to normal with fluids. Patient had questionable neurologic deficit per emergency department physician note, however no deficits noted by admitting physician. Patient unaware of particular weakness yesterday. She ambulates well today without assistance and occupational therapy reports no outpatient needs. While TIA cannot be ruled out, this seems less likely given subjective fever and chills and relatively unremarkable workup. She has no stigmata or history to suggest seizure and no previous history thereof. Echocardiogram ordered but patient wishes to defer this to the outpatient setting. LDL 58. Bilateral carotid ultrasound with no significant stenosis noted. MRI brain, MRA head unremarkable. SH WNL. 2. Hypothyroidism. TSH within normal limits. 3. Normocytic anemia, suspected baseline, acute drop likely related to dilution. No evidence of bleeding. Modest leukocytosis of unclear significance.  4. GERD. Continue PPI.  5. Fibromyalgia 6. Chronic fatigue 7. Lumbago.   She appears well, blood cultures no growth thus far, urinalysis and chest x-ray unremarkable. No evidence to suggest acute bacterial infection. Discussed  risk/benefit of ongoing observation and antibiotics for  unclear diagnosis. After discussion with family, elected to discontinue antibiotics and observe. Husband and son at bedside and she has good outpatient support. No further hospitalization likely be of benefit. Recommend outpatient follow-up with her PCP in one week, return for fever, confusion or worsening of condition. Discussed at length with family at bedside and they agree, we discussed all imaging and lab results in the differential diagnosis.  Consultants:  none  Procedures:  ECHO--patient deferred to outpatient setting  US Carotid bilateral: negative results  Antimicrobials:  Vanc 7/04 >> 7/5  Zosyn 7/04 >> 7/5  Discharge Instructions  Discharge Instructions    Activity as tolerated - No restrictions    Complete by:  As directed      Diet - low sodium heart healthy    Complete by:  As directed      Discharge instructions    Complete by:  As directed   Call your physician or seek immediate medical attention for fever, confusion, weakness, lethargy, numbness, difficulty speaking, swallowing or worsening of condition.          Current Discharge Medication List    CONTINUE these medications which have NOT CHANGED   Details  aspirin 81 MG tablet Take 81 mg by mouth daily.      Cyanocobalamin (VITAMIN B-12 PO) Take 1 tablet by mouth daily.    DULoxetine (CYMBALTA) 60 MG capsule Take 60 mg by mouth daily.    fenofibrate (TRICOR) 145 MG tablet Take 145 mg by mouth daily.    fish oil-omega-3 fatty acids 1000 MG capsule Take 2 g by mouth daily.      levothyroxine (SYNTHROID, LEVOTHROID) 88 MCG tablet Take 88 mcg by mouth daily.      Multiple Vitamin (MULTIVITAMIN) capsule Take 1 capsule by mouth daily.      naproxen sodium (ANAPROX) 220 MG tablet Take 220 mg by mouth daily as needed (pain).    omeprazole (PRILOSEC) 20 MG capsule Take 20 mg by mouth 2 (two) times daily.     pregabalin (LYRICA) 200 MG capsule  Take 200 mg by mouth 3 (three) times daily.     zolpidem (AMBIEN CR) 12.5 MG CR tablet Take 12.5 mg by mouth.       STOP taking these medications     lidocaine (LIDODERM) 5 %        Allergies  Allergen Reactions  . Sulfonamide Derivatives     REACTION: unknown reaction    The results of significant diagnostics from this hospitalization (including imaging, microbiology, ancillary and laboratory) are listed below for reference.    Significant Diagnostic Studies: Dg Chest 2 View  09/24/2015  CLINICAL DATA:  Confusion EXAM: CHEST  2 VIEW COMPARISON:  July 08, 2006 FINDINGS: There is no edema or consolidation. Heart size and pulmonary vascularity are normal. No adenopathy. There is atherosclerotic calcification in the aorta. No bone lesions. IMPRESSION: Aortic atherosclerosis.  No edema or consolidation. Electronically Signed   By: Bretta Bang III M.D.   On: 09/24/2015 14:52   Ct Head Wo Contrast  09/24/2015  CLINICAL DATA:  Dizziness EXAM: CT HEAD WITHOUT CONTRAST TECHNIQUE: Contiguous axial images were obtained from the base of the skull through the vertex without intravenous contrast. COMPARISON:  None. FINDINGS: Motion artifact severely limits the exam. Significant hemorrhage may be missed by this study. There is no obvious acute hemorrhage or midline shift. Chronic ischemic changes in the periventricular white matter are noted. There is no obvious skull fracture. IMPRESSION: Very limited examination as  described above. No obvious acute intracranial pathology. Electronically Signed   By: Jolaine ClickArthur  Hoss M.D.   On: 09/24/2015 14:46   Mr Maxine GlennMra Head Wo Contrast  09/25/2015  CLINICAL DATA:  Altered mental status, weakness, and multiple falls over the past 3 days. EXAM: MRI HEAD WITHOUT CONTRAST MRA HEAD WITHOUT CONTRAST TECHNIQUE: Multiplanar, multiecho pulse sequences of the brain and surrounding structures were obtained without intravenous contrast. Angiographic images of the head were  obtained using MRA technique without contrast. COMPARISON:  Head CT 09/24/2015. Head MRI 06/28/2006. Head MRA 08/09/2006. FINDINGS: MRI HEAD FINDINGS There is no evidence of acute infarct, intracranial hemorrhage, mass, midline shift, or extra-axial fluid collection. Mild cerebral atrophy is within normal limits for age. Patchy T2 hyperintensities throughout the cerebral white matter bilaterally have progressed from 2008 and are nonspecific but compatible with moderate chronic small vessel ischemic disease. Prior bilateral cataract extraction is noted. The paranasal sinuses are clear. There are trace bilateral mastoid effusions. Major intracranial vascular flow voids are preserved. MRA HEAD FINDINGS The visualized distal vertebral arteries are patent and codominant. PICA origins are patent. Left AICA origin is patent. Basilar artery is widely patent. SCA origins are patent. Posterior communicating arteries are not clearly identified. PCAs are patent without evidence of significant proximal stenosis. Internal carotid arteries are patent from skullbase to carotid termini without significant stenosis. ACAs and MCAs are patent without evidence of major branch occlusion or significant proximal stenosis. IMPRESSION: 1. No acute intracranial abnormality. 2. Moderate chronic small vessel ischemic disease, progressed from 2008. 3. Unremarkable head MRA. Electronically Signed   By: Sebastian AcheAllen  Grady M.D.   On: 09/25/2015 08:12   Mr Brain Wo Contrast  09/25/2015  CLINICAL DATA:  Altered mental status, weakness, and multiple falls over the past 3 days. EXAM: MRI HEAD WITHOUT CONTRAST MRA HEAD WITHOUT CONTRAST TECHNIQUE: Multiplanar, multiecho pulse sequences of the brain and surrounding structures were obtained without intravenous contrast. Angiographic images of the head were obtained using MRA technique without contrast. COMPARISON:  Head CT 09/24/2015. Head MRI 06/28/2006. Head MRA 08/09/2006. FINDINGS: MRI HEAD FINDINGS There  is no evidence of acute infarct, intracranial hemorrhage, mass, midline shift, or extra-axial fluid collection. Mild cerebral atrophy is within normal limits for age. Patchy T2 hyperintensities throughout the cerebral white matter bilaterally have progressed from 2008 and are nonspecific but compatible with moderate chronic small vessel ischemic disease. Prior bilateral cataract extraction is noted. The paranasal sinuses are clear. There are trace bilateral mastoid effusions. Major intracranial vascular flow voids are preserved. MRA HEAD FINDINGS The visualized distal vertebral arteries are patent and codominant. PICA origins are patent. Left AICA origin is patent. Basilar artery is widely patent. SCA origins are patent. Posterior communicating arteries are not clearly identified. PCAs are patent without evidence of significant proximal stenosis. Internal carotid arteries are patent from skullbase to carotid termini without significant stenosis. ACAs and MCAs are patent without evidence of major branch occlusion or significant proximal stenosis. IMPRESSION: 1. No acute intracranial abnormality. 2. Moderate chronic small vessel ischemic disease, progressed from 2008. 3. Unremarkable head MRA. Electronically Signed   By: Sebastian AcheAllen  Grady M.D.   On: 09/25/2015 08:12   [place] Carotid Bilateral  09/25/2015  CLINICAL DATA:  75 year old female with altered mental status EXAM: BILATERAL CAROTID DUPLEX ULTRASOUND TECHNIQUE: Wallace CullensGray scale imaging, color Doppler and duplex ultrasound were performed of bilateral carotid and vertebral arteries in the neck. COMPARISON:  Brain MRI/MRA 09/25/2015 FINDINGS: Criteria: Quantification of carotid stenosis is based on velocity parameters that correlate  the residual internal carotid diameter with NASCET-based stenosis levels, using the diameter of the distal internal carotid lumen as the denominator for stenosis measurement. The following velocity measurements were obtained: RIGHT ICA:  122/34  cm/sec CCA:  75/15 cm/sec SYSTOLIC ICA/CCA RATIO:  1.6 DIASTOLIC ICA/CCA RATIO:  2.3 ECA:  73 cm/sec LEFT ICA:  82/29 cm/sec CCA:  58/13 cm/sec SYSTOLIC ICA/CCA RATIO:  1.4 DIASTOLIC ICA/CCA RATIO:  2.2 ECA:  71 cm/sec RIGHT CAROTID ARTERY: No significant atherosclerotic plaque or evidence of stenosis. RIGHT VERTEBRAL ARTERY:  Patent with antegrade flow. LEFT CAROTID ARTERY: No significant atherosclerotic plaque or evidence of stenosis. LEFT VERTEBRAL ARTERY:  Patent with antegrade flow. IMPRESSION: Negative bilateral carotid duplex ultrasound. Signed, Sterling Big, MD [facility] Electronically Signed   By: Malachy Moan M.D.   On: 09/25/2015 08:36    Microbiology: Recent Results (from the past 240 hour(s))  Blood Culture (routine x 2)     Status: None (Preliminary result)   Collection Time: 09/24/15  2:10 PM  Result Value Ref Range Status   Specimen Description BLOOD LEFT FOREARM  Final   Special Requests BOTTLES DRAWN AEROBIC AND ANAEROBIC 4CC EACH  Final   Culture NO GROWTH < 24 HOURS  Final   Report Status PENDING  Incomplete  Blood Culture (routine x 2)     Status: None (Preliminary result)   Collection Time: 09/24/15  2:18 PM  Result Value Ref Range Status   Specimen Description BLOOD RIGHT WRIST  Final   Special Requests BOTTLES DRAWN AEROBIC AND ANAEROBIC 6CC EACH  Final   Culture NO GROWTH < 24 HOURS  Final   Report Status PENDING  Incomplete     Labs: Basic Metabolic Panel:  Recent Labs Lab 09/24/15 1410 09/25/15 0452  NA 136 140  K 4.0 3.6  CL 104 111  CO2 25 24  GLUCOSE 107* 111*  BUN 17 13  CREATININE 0.76 0.83  CALCIUM 9.8 8.6*   Liver Function Tests:  Recent Labs Lab 09/24/15 1410  AST 40  ALT 32  ALKPHOS 44  BILITOT 0.8  PROT 7.9  ALBUMIN 4.5   CBC:  Recent Labs Lab 09/24/15 1410 09/25/15 0452  WBC 13.8* 14.0*  NEUTROABS 11.8*  --   HGB 13.3 10.3*  HCT 39.7 31.3*  MCV 96.4  97.2  PLT 293 257   CBG:  Recent Labs Lab 09/24/15 1351  GLUCAP 111*    Principal Problem:   Altered mental status Active Problems:   Hypothyroidism   GERD   Fibromyalgia   Chronic fatigue   Lumbago   Time coordinating discharge: 35 minutes  Signed:  Brendia Sacks, MD [facility] 09/25/2015, 11:08 AM  By signing my name below, I, Adron Bene, attest that this documentation has been prepared under the direction and in the presence of Khadejah Son P. Irene Limbo, MD. Electronically Signed: Adron Bene, Scribe.   09/25/2015 10:13am  I personally performed the services described in this documentation. All medical record entries made by the scribe were at my direction. I have reviewed the chart and agree that the record reflects my personal performance and is accurate and complete. Brendia Sacks, MD

## 2015-09-25 NOTE — Progress Notes (Signed)
Discharge instructions given on medications,and follow up visits,patient verbalized understanding. Stroke booklet given mapping out your ways to recovery. No c/o pain or discomfort noted. Accompanied by staff to an awaiting vehicle.

## 2015-09-25 NOTE — Care Management Note (Signed)
Case Management Note  Patient Details  Name: [name] MRN: [id number] Date of Birth: Feb 18, 1941  Subjective/Objective:  Patient admitted from home with AMS, which has since been resolved. Patient is alert and ambulatory this morning. She reports being ind with ADL's. Dr. Margo AyeHall is her PCP and she reports no issues with obtaining meds.                 Action/Plan: Patient will discharge home today with self care. No CM needs.   Expected Discharge Date:      09/25/2015            Expected Discharge Plan:  Home/Self Care  In-House Referral:  NA  Discharge planning Services  CM Consult  Post Acute Care Choice:  NA Choice offered to:  NA  DME Arranged:    DME Agency:     HH Arranged:    HH Agency:     Status of Service:  Completed, signed off  If discussed at [employer] Length of Stay Meetings, dates discussed:    Additional Comments:  Kasidy Gianino, Chrystine OilerSharley Diane, RN 09/25/2015, 11:25 AM

## 2015-09-25 NOTE — Care Management Obs Status (Signed)
MEDICARE OBSERVATION STATUS NOTIFICATION   Patient Details  Name: [name] MRN: [id number] Date of Birth: [birth year]   Medicare Observation Status Notification Given:  Yes    Haydin Dunn, Chrystine OilerSharley Diane, RN 09/25/2015, 11:27 AM

## 2015-09-25 NOTE — Progress Notes (Signed)
PROGRESS NOTE  [name] [id number] DOB: Nov 07, 1940 DOA: 09/24/2015 PCP: [name], MD  Brief Narrative: 75 year old woman presented with confusion, difficulty balancing, jumbled speech, chills, headache, fatigue; gradual confusion of the last several weeks. Admitted for altered mental status. Noted to have tachycardia but no fever or confusion on presentation, initial lactic acid and WBC modestly elevated. Infection or TIA were considered. Urinalysis was negative, chest x-ray negative. Lumbar puncture was considered but given lack of correlating symptoms, deferred. Started on empiric antibiotics vancomycin and Zosyn and workup pursued for possible TIA. Started on aspirin.  Assessment/Plan: 1. Acute encephalopathy. Resolved. Etiology unclear. Reported fever and chills at home, however infectious workup negative including normal urinalysis, negative chest x-ray, no localizing symptoms. Symptoms resolved in the emergency department arguing against bacterial infection or severe illness. Differential would include viral illness. Lumbar puncture was appropriately deferred given rapid resolution of symptoms. Initial lactate was modestly elevated but returned to normal with fluids. Patient had questionable neurologic deficit per emergency department physician note, however no deficits noted by admitting physician. Patient unaware of particular weakness yesterday. She ambulates well today without assistance and occupational therapy reports no outpatient needs. While TIA cannot be ruled out, this seems less likely given subjective fever and chills and relatively unremarkable workup. She has no stigmata or history to suggest seizure and no previous history thereof. Echocardiogram ordered but patient wishes to defer this to the outpatient setting. LDL 58. Bilateral carotid ultrasound with no significant stenosis noted. MRI brain, MRA head unremarkable. SH WNL. 2. Hypothyroidism. TSH within normal  limits. 3. Normocytic anemia, suspected baseline, acute drop likely related to dilution. No evidence of bleeding. Modest leukocytosis of unclear significance.  4. GERD. Continue PPI.  5. Fibromyalgia 6. Chronic fatigue 7. Lumbago.    She appears well, blood cultures no growth thus far, urinalysis and chest x-ray unremarkable. No evidence to suggest acute bacterial infection. Discussed risk/benefit of ongoing observation and antibiotics for unclear diagnosis. After discussion with family, elected to discontinue antibiotics and observe. Husband and son at bedside and she has good outpatient support. No further  hospitalization likely be of benefit. Recommend outpatient follow-up with her PCP in one week, return for fever, confusion or worsening of condition. Discussed at length with family at bedside and they agree, we discussed all imaging and lab results in the differential diagnosis.  DVT prophylaxis: Lovenox Code Status: DNR Family Communication: discussed with patient, husband, and son at bedside. Disposition Plan: discharge home once improved. No OT follow-up required.  [name], MD  [facility] Direct contact: [phone] --Via amion app OR  --[email or web address]; password [email or web address]  7PM-7AM contact night coverage as above 09/25/2015, 5:20 AM    Consultants:  none  Procedures:  ECHO--patient deferred to outpatient setting  US Carotid bilateral: negative results  Antimicrobials:  Vanc 7/04 >> 7/5  Zosyn 7/04 >> 7/5  HPI/Subjective: Feels improved today. Breathing well. Denies any difficulty speaking, swallowing, numbness or tingling. She feels ready to go home. No confusion. Headache much improved. Husband and son at bedside report patient is back to normal.  Objective: Filed Vitals:   09/24/15 1900 09/24/15 1930 09/24/15 2013 09/24/15 2223  BP: 143/62 143/57 136/50 152/50  Pulse: 92 98 85 91  Temp:   98.8 F (37.1 C) 98.2 F (36.8 C)  TempSrc:   Oral Oral   Resp: 18 18  20   Height:   5\' 3"  (1.6 m)   Weight:   80.513 kg (177 lb 8 oz)   SpO2:  97% 97% 96% 98%   No intake or output data in the 24 hours ending 09/25/15 0520   Filed Weights   09/24/15 1403 09/24/15 1456 09/24/15 2013  Weight: 72.576 kg (160 lb) 77.111 kg (170 lb) 80.513 kg (177 lb 8 oz)    Exam:    Constitutional:  . Appears calm and comfortable Eyes:  . PERRL and irises appear normal . Conjunctivae and lids appear normal ENMT:  . external ears, nose appear normal . grossly normal hearing  . Lips appear normal; teeth and gums normal . Oropharynx: mucosa, tongueAppear normal, no lesions noted Respiratory:  . CTA bilaterally, no w/r/r.  . Respiratory effort normal. No retractions or accessory muscle use Cardiovascular:  . RRR, no m/r/g  . No significant LE extremity edema   . Telemetry SR Abdomen:  . Abdomen appears normal; no tenderness or masses . Non-distended Musculoskeletal:  . Normal RUE, LUE, RLE, LLE   o strength and tone normal, no atrophy, no abnormal movements Setup in bed without assistance. Stands without assistance, ambulates without difficulty, without assistance. Neurologic:  . CN 2-12 intact; no pass-pointing in upper extremities, no pronator drift . Sensation all 4 extremities intact Psychiatric:  . judgement and insight appear normal . Mental status o Mood, affect appropriate o Orientation to person, place, time   I have personally reviewed following labs and imaging studies:  BP stable  HR stable  O2 sats adequate   Repeat Lactic acid 1.82  WBC 14.0  Hgb 10.3, likely dilution  Blood sugars stable  UA, CXR, CT head, carotid dopplers, MRI head, MRA brain negative  TSH normal  Basic metabolic panel unremarkable  Scheduled Meds: .  stroke: mapping our early stages of recovery book   Does not apply Once  . aspirin EC  81 mg Oral Daily  . DULoxetine  60 mg Oral Daily  . enoxaparin (LOVENOX) injection  40 mg Subcutaneous  Q24H  . fenofibrate  160 mg Oral Daily  . levothyroxine  88 mcg Oral QAC breakfast  . multivitamin with minerals  1 tablet Oral Daily  . pantoprazole  40 mg Oral Daily  . piperacillin-tazobactam (ZOSYN)  IV  3.375 g Intravenous Q8H  . pregabalin  200 mg Oral TID  . vancomycin  1,000 mg Intravenous Q12H   Continuous Infusions: . sodium chloride      Principal Problem:   Altered mental status Active Problems:   Hypothyroidism   GERD   Fibromyalgia   Chronic fatigue   Lumbago       By signing my name below, I, Adron BeneGreylon Gawaluck, attest that this documentation has been prepared under the direction and in the presence of Daniel P. Irene LimboGoodrich, MD. Electronically Signed: Adron BeneGreylon Gawaluck, Scribe.  09/25/2015 10:13am  I personally performed the services described in this documentation. All medical record entries made by the scribe were at my direction. I have reviewed the chart and agree that the record reflects my personal performance and is accurate and complete. [name], MD

## 2015-09-25 NOTE — Progress Notes (Signed)
PT Cancellation Note  Patient Details Name: [name] MRN: [id number] DOB: [birth year]   Cancelled Treatment:    Reason Eval/Treat Not Completed: Other (comment).   Pt up walking in room when therapist entered stating that she had been discharged and she was getting dressed.  No treatment needed.   Virgina Organynthia Russell, PT CLT [phone] 09/25/2015, 10:43 AM

## 2015-09-25 NOTE — Evaluation (Signed)
Occupational Therapy Evaluation Patient Details Name: [name] MRN: [id number] DOB: 11/18/1940 Today's Date: 09/25/2015    History of Present Illness [name] is a 75 y.o. female with medical history significant of fibromylagia, hypothyroidism presenting with altered mental status. The patient reports that she noticed chills this AM upon awakening and she is rarely cold; she tried everything to get warm. When she attempted to get out of bed, patient reports that she was very very dizzy, unable to walk across the floor. Difficulty balancing, jumbled speech. About noon her husband noticed that she was freezing despite a warm house, unable to walk, very confused, talking about things that didn't make sense, no slurring of speech. No difficulty swallowing. Has not eaten today. Ongoing headache at the top of her headache and along the occiput. No temperature taken at home but husband reports she felt warm. Confusion resolved while in the ER. She reports that she is still very, very tired. Family does report that she has had some gradual confusion over the past few weeks. MRI negative for acute infarct.    Clinical Impression   Pt awake, alert, oriented x4 this am, agreeable to OT evaluation. Husband and son present for evaluation. Pt reports she is feeling much improved, no difficulty with communication this am. Pt demonstrates mod independence in ADL and functional mobility tasks, as well as transfers. Pt reports her head no longer feels "swollen." No further OT services required at this time as pt it at baseline with functional task completion.     Follow Up Recommendations  No OT follow up    Equipment Recommendations  None recommended by OT       Precautions / Restrictions Precautions Precautions: Fall Restrictions Weight Bearing Restrictions: No      Mobility Bed Mobility Overal bed mobility: Independent                Transfers Overall transfer level:  Independent Equipment used: None                       ADL Overall ADL's : Modified independent;At baseline                                             Vision Vision Assessment?: No apparent visual deficits          Pertinent Vitals/Pain Pain Assessment: No/denies pain     Hand Dominance Right   Extremity/Trunk Assessment Upper Extremity Assessment Upper Extremity Assessment: Overall WFL for tasks assessed   Lower Extremity Assessment Lower Extremity Assessment: Defer to PT evaluation       Communication Communication Communication: No difficulties   Cognition Arousal/Alertness: Awake/alert Behavior During Therapy: WFL for tasks assessed/performed Overall Cognitive Status: Within Functional Limits for tasks assessed                                Home Living Family/patient expects to be discharged to:: Private residence Living Arrangements: Spouse/significant other;Children Available Help at Discharge: Family;Available 24 hours/day               Bathroom Shower/Tub: Chief Strategy OfficerTub/shower unit   Bathroom Toilet: Standard     Home Equipment: None          Prior Functioning/Environment Level of Independence: Independent  OT Diagnosis: Altered mental status    End of Session Equipment Utilized During Treatment: Gait belt  Activity Tolerance: Patient tolerated treatment well Patient left: in bed;with call bell/phone within reach;with family/visitor present   Time: 0820-0841 OT Time Calculation (min): 21 min Charges:  OT General Charges $OT Visit: 1 Procedure OT Evaluation $OT Eval Low Complexity: 1 Procedure G-Codes: OT G-codes **NOT FOR INPATIENT CLASS** Functional Assessment Tool Used: clinical judgement Functional Limitation: Self care Self Care Current Status (Z6109(G8987): At least 1 percent but less than 20 percent impaired, limited or restricted Self Care Goal Status (U0454(G8988): At least 1 percent but  less than 20 percent impaired, limited or restricted Self Care Discharge Status [phone](G8989): At least 1 percent but less than 20 percent impaired, limited or restricted  Ezra SitesLeslie Troxler, OTR/L  [phone]  09/25/2015, 8:47 AM

## 2015-09-27 LAB — HEMOGLOBIN A1C
Hgb A1c MFr Bld: 5.6 % (ref 4.8–5.6)
Mean Plasma Glucose: 114 mg/dL

## 2015-09-29 LAB — CULTURE, BLOOD (ROUTINE X 2)
Culture: NO GROWTH
Culture: NO GROWTH

## 2015-09-30 ENCOUNTER — Telehealth: Payer: Self-pay | Admitting: Family Medicine

## 2015-09-30 NOTE — Telephone Encounter (Signed)
Urine culture noted, E coli and Pseudomonas, suprising given normal urinalysis.  Spoke with patient by telephone today, she is doing very well, has no reported urinary symptoms.  Will consider this asymptomatic bacturia and will not treat  [name], MD

## 2015-10-02 LAB — SUSCEPTIBILITY, AER + ANAEROB

## 2015-10-02 LAB — SUSCEPTIBILITY RESULT

## 2015-10-10 LAB — URINE CULTURE: Culture: >=100000 — AB

## 2015-12-24 ENCOUNTER — Other Ambulatory Visit (HOSPITAL_COMMUNITY): Payer: Self-pay | Admitting: Internal Medicine

## 2015-12-24 DIAGNOSIS — Z1231 Encounter for screening mammogram for malignant neoplasm of breast: Secondary | ICD-10-CM

## 2016-01-13 ENCOUNTER — Ambulatory Visit (HOSPITAL_COMMUNITY)
Admission: RE | Admit: 2016-01-13 | Discharge: 2016-01-13 | Disposition: A | Discharge status: Home / Self Care | Payer: Medicare Other | Source: Ambulatory Visit | Attending: Internal Medicine | Admitting: Internal Medicine

## 2016-01-13 DIAGNOSIS — Z1231 Encounter for screening mammogram for malignant neoplasm of breast: Secondary | ICD-10-CM | POA: Diagnosis not present

## 2016-01-16 ENCOUNTER — Ambulatory Visit (HOSPITAL_COMMUNITY): Payer: Medicare Other

## 2016-12-07 ENCOUNTER — Other Ambulatory Visit (HOSPITAL_COMMUNITY): Payer: Self-pay | Admitting: Internal Medicine

## 2016-12-07 DIAGNOSIS — Z1231 Encounter for screening mammogram for malignant neoplasm of breast: Secondary | ICD-10-CM

## 2017-01-15 ENCOUNTER — Ambulatory Visit (HOSPITAL_COMMUNITY)
Admission: RE | Admit: 2017-01-15 | Discharge: 2017-01-15 | Disposition: A | Discharge status: Home / Self Care | Payer: Medicare Other | Source: Ambulatory Visit | Attending: Internal Medicine | Admitting: Internal Medicine

## 2017-01-15 ENCOUNTER — Other Ambulatory Visit (HOSPITAL_COMMUNITY): Payer: Self-pay | Admitting: Internal Medicine

## 2017-01-15 DIAGNOSIS — Z1231 Encounter for screening mammogram for malignant neoplasm of breast: Secondary | ICD-10-CM | POA: Insufficient documentation

## 2017-01-15 DIAGNOSIS — M47816 Spondylosis without myelopathy or radiculopathy, lumbar region: Secondary | ICD-10-CM | POA: Diagnosis not present

## 2017-01-15 DIAGNOSIS — M47898 Other spondylosis, sacral and sacrococcygeal region: Secondary | ICD-10-CM | POA: Insufficient documentation

## 2017-01-15 DIAGNOSIS — M25551 Pain in right hip: Secondary | ICD-10-CM | POA: Insufficient documentation

## 2017-02-25 ENCOUNTER — Ambulatory Visit (INDEPENDENT_AMBULATORY_CARE_PROVIDER_SITE_OTHER): Payer: Self-pay | Admitting: Orthopaedic Surgery

## 2017-03-11 ENCOUNTER — Ambulatory Visit (INDEPENDENT_AMBULATORY_CARE_PROVIDER_SITE_OTHER): Payer: Medicare Other | Admitting: Orthopaedic Surgery

## 2017-03-11 ENCOUNTER — Ambulatory Visit (INDEPENDENT_AMBULATORY_CARE_PROVIDER_SITE_OTHER): Payer: Self-pay

## 2017-03-11 ENCOUNTER — Encounter (INDEPENDENT_AMBULATORY_CARE_PROVIDER_SITE_OTHER): Payer: Self-pay | Admitting: Orthopaedic Surgery

## 2017-03-11 VITALS — BP 155/82 | HR 73

## 2017-03-11 DIAGNOSIS — M545 Low back pain: Secondary | ICD-10-CM | POA: Diagnosis not present

## 2017-03-11 DIAGNOSIS — G8929 Other chronic pain: Secondary | ICD-10-CM

## 2017-03-12 ENCOUNTER — Encounter (INDEPENDENT_AMBULATORY_CARE_PROVIDER_SITE_OTHER): Payer: Self-pay | Admitting: Orthopaedic Surgery

## 2017-03-12 NOTE — Progress Notes (Signed)
Office Visit Note   Patient: [name]           Date of Birth: [birth year]           MRN: [id number] Visit Date: 03/11/2017              Requested by: Benita StabileHall, John Z, MD [address] [place], [place] [id number] PCP: Benita StabileHall, John Z, MD   Assessment & Plan: Visit Diagnoses:  1. Chronic right-sided low back pain, with sciatica presence unspecified     Plan: Patient has positive nerve root tension signs and some weakness L5 right. Plan MRI  Lumbar with and without maganavist. ROV after scan.   Follow-Up Instructions: No Follow-up on file.   Orders:  Orders Placed This Encounter  Procedures  . XR Lumbar Spine 2-3 Views  . MR Lumbar Spine W Wo Contrast   No orders of the defined types were placed in this encounter.     Procedures: No procedures performed   Clinical Data: No additional findings.   Subjective: Chief Complaint  Patient presents with  . Lower Back - Pain    HPI 76 year old female seen with back pain right buttocks pain that radiates down to her knee.  She had previous lumbar surgery in the 1980s by Dr. Roxan Hockeyobinson.  He saw Dr. Lovell SheehanJenkins in 1999-01-02 at that time she did not need surgery.  Been taking meloxicam and Lyrica does not want to be on any narcotics.  Surgery in the 1980s was at the L4-5 level on the left due to disc protrusion.  Have a history of fibromyalgia but has had increased back pain leg pain pain radiating down the dorsum of her foot numbness and she is also noticed weakness in her right leg.  Lyrica.  She also takes meloxicam.  Review of Systems point review of systems positive for previous cataract surgery both eyes, gallbladder surgery, hysterectomy 1978, lumbar surgery 1980s.  The patient's married she is never smoked.  She does have acid reflux anxiety bronchitis depression prediabetes past history of migraines and endometriosis and fibromyalgia.  Location list patient takes was reviewed including her hypertension, thyroid medicine praise  all for GERD.  Previous ER visits with altered mental status.  Hypothyroidism, obesity fatty liver disease.   Objective: Vital Signs: BP (!) 155/82   Pulse 73   Physical Exam  Constitutional: She is oriented to person, place, and time. She appears well-developed.  HENT:  Head: Normocephalic.  Right Ear: External ear normal.  Left Ear: External ear normal.  Eyes: Pupils are equal, round, and reactive to light.  Neck: No tracheal deviation present. No thyromegaly present.  Cardiovascular: Normal rate.  Pulmonary/Chest: Effort normal.  Abdominal: Soft.  Neurological: She is alert and oriented to person, place, and time.  Skin: Skin is warm and dry.  Psychiatric: She has a normal mood and affect. Her behavior is normal.    Ortho Exam logroll.  Ambulate has some problems with heel and toe gait.  Slight weakness anterior tib on the right .on the proximal GS  is strong.  Knee and ankle jerk are intact.  No quad weakness. Distal pulses are palpable.  There are incision is well-healed at the L4-5 level. Specialty Comments:  No specialty comments available.  Imaging: No results found.   PMFS History: Patient Active Problem List   Diagnosis Date Noted  . Altered mental status 09/24/2015  . Colon cancer screening   . Encounter for screening colonoscopy 04/04/2014  . Lumbago  05/25/2012  . Abdominal pain 07/31/2010  . Diarrhea 07/31/2010  . GERD 11/19/2008  . FATTY LIVER DISEASE 11/19/2008  . Hypothyroidism 11/15/2008  . OBESITY 11/15/2008  . Fibromyalgia 11/15/2008  . Chronic fatigue 11/15/2008  . CHOLELITHIASIS, HX OF 11/15/2008   Past Medical History:  Diagnosis Date  . Chronic back pain   . Diverticulosis   . Fatty liver   . Fibromyalgia   . GERD (gastroesophageal reflux disease) 2006   EGD by Dr. Lovell SheehanJenkins  . Hemorrhage after delivery of fetus   . Hepatitis C antibody test positive    negative pcr  . Hyperglycemia   . Hypothyroid   . S/P colonoscopy 2006   Dr.  Lovell SheehanJenkins    Family History  Problem Relation Age of Onset  . Brain cancer Father   . Coronary artery disease Mother   . Prostate cancer Brother   . Cancer Sister        gyn  . Lung cancer Brother   . Seizures Son   . Pancreatitis Daughter        ?gallstones  . Colon cancer Neg Hx     Past Surgical History:  Procedure Laterality Date  . APPENDECTOMY    . cataracts    . CHOLECYSTECTOMY  1998  . COLONOSCOPY WITH PROPOFOL N/A 04/26/2014   Procedure: COLONOSCOPY WITH PROPOFOL;  Surgeon: Corbin Adeobert M Rourk, MD;  Location: [facility];  Service: Endoscopy;  Laterality: N/A;  0939 in cecum, total withdrawal time, 10 min  . LUMBAR DISC SURGERY    . TOTAL ABDOMINAL HYSTERECTOMY W/ BILATERAL SALPINGOOPHORECTOMY     Social History   Occupational History  . Occupation: retired    Comment: [employer]  Tobacco Use  . Smoking status: Never Smoker  . Smokeless tobacco: Never Used  Substance and Sexual Activity  . Alcohol use: No  . Drug use: No  . Sexual activity: No

## 2017-03-24 ENCOUNTER — Ambulatory Visit (HOSPITAL_COMMUNITY)
Admission: RE | Admit: 2017-03-24 | Discharge: 2017-03-24 | Disposition: A | Discharge status: Home / Self Care | Payer: Medicare Other | Source: Ambulatory Visit | Attending: Orthopaedic Surgery | Admitting: Orthopaedic Surgery

## 2017-03-24 DIAGNOSIS — M5126 Other intervertebral disc displacement, lumbar region: Secondary | ICD-10-CM | POA: Insufficient documentation

## 2017-03-24 DIAGNOSIS — G8929 Other chronic pain: Secondary | ICD-10-CM | POA: Diagnosis present

## 2017-03-24 DIAGNOSIS — M5127 Other intervertebral disc displacement, lumbosacral region: Secondary | ICD-10-CM | POA: Insufficient documentation

## 2017-03-24 DIAGNOSIS — M48061 Spinal stenosis, lumbar region without neurogenic claudication: Secondary | ICD-10-CM | POA: Diagnosis not present

## 2017-03-24 DIAGNOSIS — M545 Low back pain: Secondary | ICD-10-CM | POA: Diagnosis present

## 2017-03-24 DIAGNOSIS — M4316 Spondylolisthesis, lumbar region: Secondary | ICD-10-CM | POA: Insufficient documentation

## 2017-03-24 LAB — POCT I-STAT CREATININE: Creatinine, Ser: 0.8 mg/dL (ref 0.44–1.00)

## 2017-03-24 MED ORDER — GADOBENATE DIMEGLUMINE 529 MG/ML IV SOLN
15.0000 mL | Freq: Once | INTRAVENOUS | Status: AC | PRN
  Administered 2017-03-24: 15 mL via INTRAVENOUS

## 2017-04-01 ENCOUNTER — Ambulatory Visit (INDEPENDENT_AMBULATORY_CARE_PROVIDER_SITE_OTHER): Payer: Self-pay

## 2017-04-01 ENCOUNTER — Encounter (INDEPENDENT_AMBULATORY_CARE_PROVIDER_SITE_OTHER): Payer: Self-pay | Admitting: Orthopaedic Surgery

## 2017-04-01 ENCOUNTER — Ambulatory Visit (INDEPENDENT_AMBULATORY_CARE_PROVIDER_SITE_OTHER): Payer: Medicare Other | Admitting: Orthopaedic Surgery

## 2017-04-01 VITALS — BP 143/85 | HR 80 | Ht 62.0 in | Wt 170.0 lb

## 2017-04-01 DIAGNOSIS — G8929 Other chronic pain: Secondary | ICD-10-CM

## 2017-04-01 DIAGNOSIS — M5440 Lumbago with sciatica, unspecified side: Secondary | ICD-10-CM | POA: Diagnosis not present

## 2017-04-01 DIAGNOSIS — M48062 Spinal stenosis, lumbar region with neurogenic claudication: Secondary | ICD-10-CM | POA: Diagnosis not present

## 2017-04-01 DIAGNOSIS — M5116 Intervertebral disc disorders with radiculopathy, lumbar region: Secondary | ICD-10-CM

## 2017-04-01 NOTE — Progress Notes (Signed)
Office Visit Note   Patient: [name]           Date of Birth: 1940/04/28           MRN: [id number] Visit Date: 04/01/2017              Requested by: Benita Stabile, MD [address] [place], [place] [address] PCP: Benita Stabile, MD   Assessment & Plan: Visit Diagnoses:  1. Chronic right-sided low back pain with sciatica, sciatica laterality unspecified   2. Spinal stenosis of lumbar region with neurogenic claudication   3. Lumbar disc herniation with radiculopathy    Patient has severe spinal stenosis at L3-4 with neurogenic claudication.  She also has disc extrusion on the right at the L3-4 level and anterolisthesis which is stable on flexion extension radiographs. Plan: Discussed option she would like to proceed with surgery.  Plan would be single level decompression L3-4 with removal of L3-4 HNP overnight stay in the hospital she will need medical clearance by Dr. Dwana Melena preoperatively.  Risks of surgery discussed including potential for progressive instability, need for spinal fusion, dural tear, infection, progression at other lumbar levels.  Questions were elicited and answered she understands and requests we proceed.  Follow-Up Instructions: No Follow-up on file.   Orders:  Orders Placed This Encounter  Procedures  . XR Lumb Spine Flex&Ext Only   No orders of the defined types were placed in this encounter.     Procedures: No procedures performed   Clinical Data: No additional findings.   Subjective: Chief Complaint  Patient presents with  . Lower Back - Pain, Follow-up    MRI review    HPI 77 year old female returns with ongoing back pain right buttocks pain that radiates down to her knee.  Previous lumbar surgery by Dr. Rosalie Doctor in the 1980s.  Back pain over the last 6-18 months, increased with standing and with walking.  She gets better with sitting or supine position.  Lyrica is not been effective she is also been on meloxicam.  She has less  pain and can walk better if she leans on a grocery cart.  She has some weakness in her right quad at times left foot first.  Review of Systems 14 point review of systems updated unchanged from 03/11/2017.  Previous lumbar surgery at the L4-5 level in the 1980s.  Positive for acid reflux, bronchitis, depression, prediabetes, history of migraines, endometriosis, fibromyalgia.  Tension, thyroid supplement, GERD obesity with fatty liver disease.  Vitrectomy, gallbladder surgery cataract surgery.   Objective: Vital Signs: BP (!) 143/85   Pulse 80   Ht 5\' 2"  (1.575 m)   Wt 170 lb (77.1 kg)   BMI 31.09 kg/m   Physical Exam  Constitutional: She is oriented to person, place, and time. She appears well-developed.  HENT:  Head: Normocephalic.  Right Ear: External ear normal.  Left Ear: External ear normal.  Eyes: Pupils are equal, round, and reactive to light.  Neck: No tracheal deviation present. No thyromegaly present.  Cardiovascular: Normal rate.  Pulmonary/Chest: Effort normal.  Abdominal: Soft.  Neurological: She is alert and oriented to person, place, and time.  Skin: Skin is warm and dry.  Psychiatric: She has a normal mood and affect. Her behavior is normal.    Ortho Exam patient can ambulate with heel toe gait.  Step up on a step only using first her  left foot.  Hip is slightly weak on the right.  Healed lumbar incision at the L4-5 level.  Sciatic notch tenderness.  Reverse straight leg raising.  Pulses palpable.  Specialty Comments:  No specialty comments available.  Imaging: CLINICAL DATA:  Initial evaluation for low back pain extending into right hip and upper thigh. Remote history of surgery.  EXAM: MRI LUMBAR SPINE WITHOUT AND WITH CONTRAST  TECHNIQUE: Multiplanar and multiecho pulse sequences of the lumbar spine were obtained without and with intravenous contrast.  CONTRAST:  15mL MULTIHANCE GADOBENATE DIMEGLUMINE 529 MG/ML IV SOLN  COMPARISON:  Prior  radiograph from 04/29/2012 as well as previous MRI from 02/07/2009.  FINDINGS: Segmentation: Normal segmentation. Lowest well-formed disc labeled the L5-S1 level.  Alignment: 3 mm anterolisthesis of L3 on L4 and L4 on L5, new from previous. Trace retrolisthesis of L1 on L2 and L2 on L3, also new. Mild levoscoliosis.  Vertebrae: Vertebral body heights are maintained without evidence for acute or chronic fracture. Bone marrow signal intensity within normal limits. Small benign hemangiomas noted within the L3 and L4 vertebral bodies. No other discrete or worrisome osseous lesions. Mild enhancement about the L3-4 and L4-5 facets bilaterally due to facet arthritis.  Conus medullaris and cauda equina: Conus extends to the L1 level. Conus and cauda equina appear normal.  Paraspinal and other soft tissues: Paraspinous soft tissues within normal limits. Visualized visceral structures are normal.  Disc levels:  T10-11: Seen only on sagittal projection. Minimal disc bulge without stenosis.  T11-12: Seen only on sagittal projection. Small central disc protrusion indents the ventral thecal sac. No significant stenosis.  T12-L1:  Unremarkable.  L1-2: Diffuse disc bulge with disc desiccation and intervertebral disc space narrowing. Superimposed left central disc protrusion indents the ventral thecal sac. No canal stenosis or impingement. Foramina remain patent.  L2-3: Mild diffuse disc bulge with disc desiccation. Mild facet and ligament flavum hypertrophy. No canal or foraminal stenosis. No impingement.  L3-4: 3 mm anterolisthesis. Diffuse disc bulge with disc desiccation and intervertebral disc space narrowing. There is a superimposed right subarticular/foraminal disc extrusion with slight superior angulation (series 7, image 7). Disc material encroaches upon the right lateral recess with probable impingement of the descending right L4 nerve root. Right L3 nerve root  also potentially affected. Superimposed severe facet and ligamentum flavum hypertrophy. Resultant severe spinal stenosis with bilateral lateral recess narrowing, worse on the right. Thecal sac measures 6 mm in AP diameter. Mild to moderate bilateral L3 foraminal narrowing, also slightly worse on the right.  L4-5: 3 mm anterolisthesis. Diffuse disc bulge with disc desiccation and intervertebral disc space narrowing. Disc bulging slightly eccentric to the left, closely approximating the left L4 nerve root in the left neural foramen (series 3, image 10). Advanced facet and ligament flavum hypertrophy. Resultant mild to moderate canal and left lateral recess stenosis. Mild to moderate left L4 foraminal narrowing.  L5-S1: Diffuse disc bulge with disc desiccation and intervertebral disc space narrowing. Left extraforaminal/far lateral reactive endplate changes with marginal endplate osteophytosis. Superimposed left foraminal disc protrusion with slight superior angulation (series 7, image 10 on sagittal sequence, series 5, image 37 on axial sequence). Mild facet ligament flavum hypertrophy. Remote left hemi laminectomy defect at this level. No significant canal lateral recess narrowing. Mild to moderate left L5 foraminal stenosis.  IMPRESSION: 1. New 3 mm anterolisthesis of L3 on L4 and L4 on L5 with associated disc bulge and advanced facet arthropathy. Resultant severe canal stenosis at the L3-4 level. 2. Superimposed right subarticular/foraminal disc extrusion at L3-4, potentially affecting either the right L3  or L4 nerve roots. 3. Left eccentric disc bulge with facet disease at L4-5 with resultant mild to moderate left foraminal and subarticular stenosis. 4. Left foraminal disc protrusion with reactive endplate changes at L5-S1 with resultant mild to moderate left L5 foraminal stenosis.   Electronically Signed   By: Rise MuBenjamin  McClintock M.D.   On: 03/25/2017 03:43   PMFS  History: Patient Active Problem List   Diagnosis Date Noted  . Altered mental status 09/24/2015  . Colon cancer screening   . Encounter for screening colonoscopy 04/04/2014  . Lumbago 05/25/2012  . Abdominal pain 07/31/2010  . Diarrhea 07/31/2010  . GERD 11/19/2008  . FATTY LIVER DISEASE 11/19/2008  . Hypothyroidism 11/15/2008  . OBESITY 11/15/2008  . Fibromyalgia 11/15/2008  . Chronic fatigue 11/15/2008  . CHOLELITHIASIS, HX OF 11/15/2008   Past Medical History:  Diagnosis Date  . Chronic back pain   . Diverticulosis   . Fatty liver   . Fibromyalgia   . GERD (gastroesophageal reflux disease) 2006   EGD by Dr. Lovell SheehanJenkins  . Hemorrhage after delivery of fetus   . Hepatitis C antibody test positive    negative pcr  . Hyperglycemia   . Hypothyroid   . S/P colonoscopy 2006   Dr. Lovell SheehanJenkins    Family History  Problem Relation Age of Onset  . Brain cancer Father   . Coronary artery disease Mother   . Prostate cancer Brother   . Cancer Sister        gyn  . Lung cancer Brother   . Seizures Son   . Pancreatitis Daughter        ?gallstones  . Colon cancer Neg Hx     Past Surgical History:  Procedure Laterality Date  . APPENDECTOMY    . cataracts    . CHOLECYSTECTOMY  1998  . COLONOSCOPY WITH PROPOFOL N/A 04/26/2014   Procedure: COLONOSCOPY WITH PROPOFOL;  Surgeon: Corbin Adeobert M Rourk, MD;  Location: [facility];  Service: Endoscopy;  Laterality: N/A;  0939 in cecum, total withdrawal time, 10 min  . LUMBAR DISC SURGERY    . TOTAL ABDOMINAL HYSTERECTOMY W/ BILATERAL SALPINGOOPHORECTOMY     Social History   Occupational History  . Occupation: retired    Comment: [employer]  Tobacco Use  . Smoking status: Never Smoker  . Smokeless tobacco: Never Used  Substance and Sexual Activity  . Alcohol use: No  . Drug use: No  . Sexual activity: No

## 2017-04-05 ENCOUNTER — Encounter (INDEPENDENT_AMBULATORY_CARE_PROVIDER_SITE_OTHER): Payer: Self-pay | Admitting: Orthopaedic Surgery

## 2017-05-03 ENCOUNTER — Encounter (HOSPITAL_COMMUNITY): Payer: Self-pay

## 2017-05-03 NOTE — Pre-Procedure Instructions (Signed)
[name]  05/03/2017      [facility] - [place], [place] - [id number] [place] [address] Phone: [phone] Fax: [phone]    Your procedure is scheduled on 05/10/2017.  [facility] at 1245 A.M.  Call this number if you have problems the morning of surgery:  [phone]   Remember:  Do not eat food or drink liquids after midnight.   Continue all medications as directed by your physician except follow these medication instructions before surgery below   Take these medicines the morning of surgery with A SIP OF WATER:  Acetaminophen (Tylenol) - if needed for pain Diphenydramine (Benadryl) - if needed for allergies Duloxetine (Cymbalta) Levothyroxine (Synthroid) Omeprazole (Prilosec) pregabalin (Lyrica)  7 days prior to surgery STOP taking any Meloxicam (Mobic), Aspirin (unless otherwise instructed by your surgeon), Aleve, Naproxen, Ibuprofen, Motrin, Advil, Goody's, BC's, all herbal medications, fish oil, and all vitamins    Do not wear jewelry, make-up or nail polish.  Do not wear lotions, powders, or perfumes, or deodorant.  Do not shave 48 hours prior to surgery.  Do not bring valuables to the [facility].  [name] is not responsible for any belongings or valuables.  Hearing aids, eyeglasses, contacts, dentures or bridgework may not be worn into surgery.  Leave your suitcase in the car.  After surgery it may be brought to your room.  For patients admitted to the [facility], discharge time will be determined by your treatment team.  Patients discharged the day of surgery will not be allowed to drive home.   Name and phone number of your driver:    Special instructions:   [facility]- Preparing For Surgery  Before surgery, you can play an important role. Because skin is not sterile, your skin needs to be as free of germs as possible. You can reduce the number of germs on your skin by  washing with CHG (chlorahexidine gluconate) Soap before surgery.  CHG is an antiseptic cleaner which kills germs and bonds with the skin to continue killing germs even after washing.  Please do not use if you have an allergy to CHG or antibacterial soaps. If your skin becomes reddened/irritated stop using the CHG.  Do not shave (including legs and underarms) for at least 48 hours prior to first CHG shower. It is OK to shave your face.  Please follow these instructions carefully.   1. Shower the NIGHT BEFORE SURGERY and the MORNING OF SURGERY with CHG.   2. If you chose to wash your hair, wash your hair first as usual with your normal shampoo.  3. After you shampoo, rinse your hair and body thoroughly to remove the shampoo.  4. Use CHG as you would any other liquid soap. You can apply CHG directly to the skin and wash gently with a scrungie or a clean washcloth.   5. Apply the CHG Soap to your body ONLY FROM THE NECK DOWN.  Do not use on open wounds or open sores. Avoid contact with your eyes, ears, mouth and genitals (private parts). Wash Face and genitals (private parts)  with your normal soap.  6. Wash thoroughly, paying special attention to the area where your surgery will be performed.  7. Thoroughly rinse your body with warm water from the neck down.  8. DO NOT shower/wash with your normal soap after using and rinsing off the CHG Soap.  9. Pat yourself dry with a CLEAN TOWEL.  10. Wear CLEAN PAJAMAS to bed the night before surgery, wear comfortable clothes the morning of surgery  11. Place CLEAN SHEETS on your bed the night of your first shower and DO NOT SLEEP WITH PETS.    Day of Surgery: Shower as stated above. Do not apply any deodorants/lotions.  Please wear clean clothes to the [facility]/surgery center.      Please read over the following fact sheets that you were given.

## 2017-05-04 ENCOUNTER — Encounter (HOSPITAL_COMMUNITY)
Admission: RE | Admit: 2017-05-04 | Discharge: 2017-05-04 | Disposition: A | Discharge status: Home / Self Care | Payer: Medicare Other | Source: Ambulatory Visit | Attending: Surgery | Admitting: Surgery

## 2017-05-04 ENCOUNTER — Encounter (HOSPITAL_COMMUNITY): Payer: Self-pay

## 2017-05-04 ENCOUNTER — Encounter (HOSPITAL_COMMUNITY)
Admission: RE | Admit: 2017-05-04 | Discharge: 2017-05-04 | Disposition: A | Discharge status: Home / Self Care | Payer: Medicare Other | Source: Ambulatory Visit | Attending: Orthopaedic Surgery | Admitting: Orthopaedic Surgery

## 2017-05-04 ENCOUNTER — Other Ambulatory Visit: Payer: Self-pay

## 2017-05-04 DIAGNOSIS — Z01810 Encounter for preprocedural cardiovascular examination: Secondary | ICD-10-CM | POA: Insufficient documentation

## 2017-05-04 DIAGNOSIS — Z01812 Encounter for preprocedural laboratory examination: Secondary | ICD-10-CM | POA: Insufficient documentation

## 2017-05-04 DIAGNOSIS — M48062 Spinal stenosis, lumbar region with neurogenic claudication: Secondary | ICD-10-CM | POA: Insufficient documentation

## 2017-05-04 DIAGNOSIS — Z01818 Encounter for other preprocedural examination: Secondary | ICD-10-CM | POA: Diagnosis present

## 2017-05-04 HISTORY — DX: Headache, unspecified: R51.9

## 2017-05-04 HISTORY — DX: Unspecified osteoarthritis, unspecified site: M19.90

## 2017-05-04 HISTORY — DX: Anxiety disorder, unspecified: F41.9

## 2017-05-04 HISTORY — DX: Essential (primary) hypertension: I10

## 2017-05-04 HISTORY — DX: Headache: R51

## 2017-05-04 LAB — GLUCOSE, CAPILLARY: Glucose-Capillary: 110 mg/dL — ABNORMAL HIGH (ref 65–99)

## 2017-05-04 LAB — COMPREHENSIVE METABOLIC PANEL
ALT: 31 U/L (ref 14–54)
AST: 34 U/L (ref 15–41)
Albumin: 4.5 g/dL (ref 3.5–5.0)
Alkaline Phosphatase: 42 U/L (ref 38–126)
Anion gap: 11 (ref 5–15)
BUN: 19 mg/dL (ref 6–20)
CO2: 24 mmol/L (ref 22–32)
Calcium: 9.9 mg/dL (ref 8.9–10.3)
Chloride: 105 mmol/L (ref 101–111)
Creatinine, Ser: 0.8 mg/dL (ref 0.44–1.00)
GFR calc Af Amer: >60 mL/min (ref >60)
GFR calc non Af Amer: >60 mL/min (ref >60)
Glucose, Bld: 90 mg/dL (ref 65–99)
Potassium: 3.9 mmol/L (ref 3.5–5.1)
Sodium: 140 mmol/L (ref 135–145)
Total Bilirubin: 0.7 mg/dL (ref 0.3–1.2)
Total Protein: 7.7 g/dL (ref 6.5–8.1)

## 2017-05-04 LAB — CBC
HCT: 40.9 % (ref 36.0–46.0)
Hemoglobin: 13.5 g/dL (ref 12.0–15.0)
MCH: 32.3 pg (ref 26.0–34.0)
MCHC: 33 g/dL (ref 30.0–36.0)
MCV: 97.8 fL (ref 78.0–100.0)
Platelets: 326 10*3/uL (ref 150–400)
RBC: 4.18 MIL/uL (ref 3.87–5.11)
RDW: 12.9 % (ref 11.5–15.5)
WBC: 7 10*3/uL (ref 4.0–10.5)

## 2017-05-04 LAB — URINALYSIS, ROUTINE W REFLEX MICROSCOPIC
Bilirubin Urine: NEGATIVE
Glucose, UA: NEGATIVE mg/dL
Hgb urine dipstick: NEGATIVE
Ketones, ur: NEGATIVE mg/dL
Nitrite: POSITIVE — AB
Protein, ur: NEGATIVE mg/dL
Specific Gravity, Urine: 1.015 (ref 1.005–1.030)
pH: 7 (ref 5.0–8.0)

## 2017-05-04 LAB — SURGICAL PCR SCREEN
MRSA, PCR: NEGATIVE
Staphylococcus aureus: NEGATIVE

## 2017-05-04 NOTE — Progress Notes (Signed)
Pt. Denies chest, flu concerns. Pt. Denies seeing cardio for any problems, denies ever having even an ekg in the past, pt. Reports she has been followed by Dr. Kendell Baneourke  for Hep C- but told that its negative. PCP- Dr. Hughie ClossZ. Hall in [place].

## 2017-05-05 ENCOUNTER — Other Ambulatory Visit (INDEPENDENT_AMBULATORY_CARE_PROVIDER_SITE_OTHER): Payer: Self-pay | Admitting: Radiology

## 2017-05-05 MED ORDER — CIPROFLOXACIN HCL 500 MG PO TABS: ORAL_TABLET | ORAL | 0 refills | Status: DC

## 2017-05-10 ENCOUNTER — Ambulatory Visit (HOSPITAL_COMMUNITY): Payer: Medicare Other | Admitting: Certified Registered Nurse Anesthetist

## 2017-05-10 ENCOUNTER — Other Ambulatory Visit: Payer: Self-pay

## 2017-05-10 ENCOUNTER — Encounter (HOSPITAL_COMMUNITY)
Admission: RE | Disposition: A | Discharge status: Home / Self Care | Payer: Self-pay | Source: Ambulatory Visit | Attending: Orthopaedic Surgery

## 2017-05-10 ENCOUNTER — Observation Stay (HOSPITAL_COMMUNITY)
Admission: RE | Admit: 2017-05-10 | Discharge: 2017-05-11 | Disposition: A | Discharge status: Home / Self Care | Payer: Medicare Other | Source: Ambulatory Visit | Attending: Orthopaedic Surgery | Admitting: Orthopaedic Surgery

## 2017-05-10 ENCOUNTER — Encounter (HOSPITAL_COMMUNITY): Payer: Self-pay | Admitting: *Deleted

## 2017-05-10 ENCOUNTER — Ambulatory Visit (HOSPITAL_COMMUNITY): Payer: Medicare Other

## 2017-05-10 DIAGNOSIS — M549 Dorsalgia, unspecified: Secondary | ICD-10-CM | POA: Insufficient documentation

## 2017-05-10 DIAGNOSIS — M48062 Spinal stenosis, lumbar region with neurogenic claudication: Secondary | ICD-10-CM | POA: Diagnosis present

## 2017-05-10 DIAGNOSIS — Z791 Long term (current) use of non-steroidal anti-inflammatories (NSAID): Secondary | ICD-10-CM | POA: Diagnosis not present

## 2017-05-10 DIAGNOSIS — M48061 Spinal stenosis, lumbar region without neurogenic claudication: Principal | ICD-10-CM | POA: Insufficient documentation

## 2017-05-10 DIAGNOSIS — I10 Essential (primary) hypertension: Secondary | ICD-10-CM | POA: Insufficient documentation

## 2017-05-10 DIAGNOSIS — Z7982 Long term (current) use of aspirin: Secondary | ICD-10-CM | POA: Insufficient documentation

## 2017-05-10 DIAGNOSIS — E039 Hypothyroidism, unspecified: Secondary | ICD-10-CM | POA: Diagnosis not present

## 2017-05-10 DIAGNOSIS — Z91048 Other nonmedicinal substance allergy status: Secondary | ICD-10-CM | POA: Diagnosis not present

## 2017-05-10 DIAGNOSIS — M5126 Other intervertebral disc displacement, lumbar region: Secondary | ICD-10-CM | POA: Diagnosis not present

## 2017-05-10 DIAGNOSIS — K219 Gastro-esophageal reflux disease without esophagitis: Secondary | ICD-10-CM | POA: Insufficient documentation

## 2017-05-10 DIAGNOSIS — Z419 Encounter for procedure for purposes other than remedying health state, unspecified: Secondary | ICD-10-CM

## 2017-05-10 DIAGNOSIS — F419 Anxiety disorder, unspecified: Secondary | ICD-10-CM | POA: Diagnosis not present

## 2017-05-10 DIAGNOSIS — M797 Fibromyalgia: Secondary | ICD-10-CM | POA: Insufficient documentation

## 2017-05-10 DIAGNOSIS — Z882 Allergy status to sulfonamides status: Secondary | ICD-10-CM | POA: Diagnosis not present

## 2017-05-10 DIAGNOSIS — Z79899 Other long term (current) drug therapy: Secondary | ICD-10-CM | POA: Diagnosis not present

## 2017-05-10 DIAGNOSIS — M1990 Unspecified osteoarthritis, unspecified site: Secondary | ICD-10-CM | POA: Insufficient documentation

## 2017-05-10 DIAGNOSIS — B1920 Unspecified viral hepatitis C without hepatic coma: Secondary | ICD-10-CM | POA: Insufficient documentation

## 2017-05-10 DIAGNOSIS — G8929 Other chronic pain: Secondary | ICD-10-CM | POA: Diagnosis not present

## 2017-05-10 HISTORY — PX: LUMBAR LAMINECTOMY/DECOMPRESSION MICRODISCECTOMY: SHX5026

## 2017-05-10 SURGERY — LUMBAR LAMINECTOMY/DECOMPRESSION MICRODISCECTOMY
Anesthesia: General

## 2017-05-10 MED ORDER — SODIUM CHLORIDE 0.9% FLUSH: 3.0000 mL | Freq: Two times a day (BID) | INTRAVENOUS | Status: DC

## 2017-05-10 MED ORDER — PREGABALIN 50 MG PO CAPS
200.0000 mg | ORAL_CAPSULE | ORAL | Status: DC
  Administered 2017-05-11: 200 mg via ORAL
  Filled 2017-05-10: qty 1

## 2017-05-10 MED ORDER — MENTHOL 3 MG MT LOZG: 1.0000 | LOZENGE | OROMUCOSAL | Status: DC | PRN

## 2017-05-10 MED ORDER — OXYCODONE HCL 5 MG/5ML PO SOLN: 5.0000 mg | Freq: Once | ORAL | Status: DC | PRN

## 2017-05-10 MED ORDER — MIDAZOLAM HCL 2 MG/2ML IJ SOLN
INTRAMUSCULAR | Status: AC
  Filled 2017-05-10: qty 2

## 2017-05-10 MED ORDER — LIDOCAINE 2% (20 MG/ML) 5 ML SYRINGE
INTRAMUSCULAR | Status: DC | PRN
  Administered 2017-05-10: 80 mg via INTRAVENOUS

## 2017-05-10 MED ORDER — HYDROCHLOROTHIAZIDE 12.5 MG PO CAPS
12.5000 mg | ORAL_CAPSULE | Freq: Every day | ORAL | Status: DC
  Administered 2017-05-10: 12.5 mg via ORAL
  Filled 2017-05-10: qty 1

## 2017-05-10 MED ORDER — SUGAMMADEX SODIUM 200 MG/2ML IV SOLN
INTRAVENOUS | Status: AC
  Filled 2017-05-10: qty 2

## 2017-05-10 MED ORDER — LEVOTHYROXINE SODIUM 100 MCG PO TABS
100.0000 ug | ORAL_TABLET | Freq: Every day | ORAL | Status: DC
  Administered 2017-05-11: 100 ug via ORAL
  Filled 2017-05-10: qty 1

## 2017-05-10 MED ORDER — ONDANSETRON HCL 4 MG/2ML IJ SOLN: 4.0000 mg | Freq: Once | INTRAMUSCULAR | Status: DC | PRN

## 2017-05-10 MED ORDER — ONDANSETRON HCL 4 MG/2ML IJ SOLN
INTRAMUSCULAR | Status: DC | PRN
  Administered 2017-05-10: 4 mg via INTRAVENOUS

## 2017-05-10 MED ORDER — ACETAMINOPHEN 325 MG PO TABS: 650.0000 mg | ORAL_TABLET | ORAL | Status: DC | PRN

## 2017-05-10 MED ORDER — PANTOPRAZOLE SODIUM 40 MG PO TBEC: 40.0000 mg | DELAYED_RELEASE_TABLET | Freq: Every day | ORAL | Status: DC

## 2017-05-10 MED ORDER — SODIUM CHLORIDE 0.9 % IV SOLN: 250.0000 mL | INTRAVENOUS | Status: DC

## 2017-05-10 MED ORDER — LIDOCAINE 2% (20 MG/ML) 5 ML SYRINGE
INTRAMUSCULAR | Status: AC
  Filled 2017-05-10: qty 5

## 2017-05-10 MED ORDER — BUPIVACAINE HCL (PF) 0.25 % IJ SOLN
INTRAMUSCULAR | Status: DC | PRN
  Administered 2017-05-10: 10 mL

## 2017-05-10 MED ORDER — PREGABALIN 75 MG PO CAPS
200.0000 mg | ORAL_CAPSULE | Freq: Once | ORAL | Status: AC
  Administered 2017-05-10: 200 mg via ORAL
  Filled 2017-05-10: qty 1

## 2017-05-10 MED ORDER — ONDANSETRON HCL 4 MG PO TABS: 4.0000 mg | ORAL_TABLET | Freq: Four times a day (QID) | ORAL | Status: DC | PRN

## 2017-05-10 MED ORDER — DULOXETINE HCL 30 MG PO CPEP
60.0000 mg | ORAL_CAPSULE | Freq: Every day | ORAL | Status: DC
  Administered 2017-05-11: 60 mg via ORAL
  Filled 2017-05-10: qty 2

## 2017-05-10 MED ORDER — DIPHENHYDRAMINE HCL 25 MG PO CAPS
25.0000 mg | ORAL_CAPSULE | Freq: Every evening | ORAL | Status: DC | PRN
  Administered 2017-05-10: 25 mg via ORAL
  Filled 2017-05-10: qty 1

## 2017-05-10 MED ORDER — SUGAMMADEX SODIUM 200 MG/2ML IV SOLN
INTRAVENOUS | Status: DC | PRN
  Administered 2017-05-10: 200 mg via INTRAVENOUS

## 2017-05-10 MED ORDER — CIPROFLOXACIN HCL 500 MG PO TABS
500.0000 mg | ORAL_TABLET | Freq: Two times a day (BID) | ORAL | Status: DC
  Filled 2017-05-10 (×2): qty 1

## 2017-05-10 MED ORDER — FENTANYL CITRATE (PF) 250 MCG/5ML IJ SOLN
INTRAMUSCULAR | Status: AC
  Filled 2017-05-10: qty 5

## 2017-05-10 MED ORDER — DEXAMETHASONE SODIUM PHOSPHATE 10 MG/ML IJ SOLN
INTRAMUSCULAR | Status: AC
  Filled 2017-05-10: qty 1

## 2017-05-10 MED ORDER — PHENOL 1.4 % MT LIQD: 1.0000 | OROMUCOSAL | Status: DC | PRN

## 2017-05-10 MED ORDER — HYDROCODONE-ACETAMINOPHEN 7.5-325 MG PO TABS
1.0000 | ORAL_TABLET | ORAL | Status: DC | PRN
  Administered 2017-05-10 – 2017-05-11 (×3): 1 via ORAL
  Filled 2017-05-10 (×3): qty 1

## 2017-05-10 MED ORDER — DOCUSATE SODIUM 100 MG PO CAPS
100.0000 mg | ORAL_CAPSULE | Freq: Two times a day (BID) | ORAL | Status: DC
  Administered 2017-05-10: 100 mg via ORAL
  Filled 2017-05-10: qty 1

## 2017-05-10 MED ORDER — CEFAZOLIN SODIUM-DEXTROSE 2-4 GM/100ML-% IV SOLN
INTRAVENOUS | Status: AC
  Filled 2017-05-10: qty 100

## 2017-05-10 MED ORDER — DEXAMETHASONE SODIUM PHOSPHATE 10 MG/ML IJ SOLN
INTRAMUSCULAR | Status: DC | PRN
  Administered 2017-05-10: 10 mg via INTRAVENOUS

## 2017-05-10 MED ORDER — LACTATED RINGERS IV SOLN
INTRAVENOUS | Status: DC
  Administered 2017-05-10 (×2): via INTRAVENOUS

## 2017-05-10 MED ORDER — ONDANSETRON HCL 4 MG/2ML IJ SOLN
INTRAMUSCULAR | Status: AC
  Filled 2017-05-10: qty 2

## 2017-05-10 MED ORDER — PROPOFOL 10 MG/ML IV BOLUS
INTRAVENOUS | Status: AC
  Filled 2017-05-10: qty 20

## 2017-05-10 MED ORDER — FENOFIBRATE 160 MG PO TABS
160.0000 mg | ORAL_TABLET | Freq: Every day | ORAL | Status: DC
  Administered 2017-05-10: 160 mg via ORAL
  Filled 2017-05-10 (×2): qty 1

## 2017-05-10 MED ORDER — SODIUM CHLORIDE 0.9% FLUSH: 3.0000 mL | INTRAVENOUS | Status: DC | PRN

## 2017-05-10 MED ORDER — PROPOFOL 10 MG/ML IV BOLUS
INTRAVENOUS | Status: DC | PRN
  Administered 2017-05-10: 140 mg via INTRAVENOUS

## 2017-05-10 MED ORDER — SODIUM CHLORIDE 0.9 % IV SOLN: INTRAVENOUS | Status: DC

## 2017-05-10 MED ORDER — MIDAZOLAM HCL 2 MG/2ML IJ SOLN
INTRAMUSCULAR | Status: DC | PRN
  Administered 2017-05-10: 1 mg via INTRAVENOUS

## 2017-05-10 MED ORDER — BUPIVACAINE HCL (PF) 0.25 % IJ SOLN
INTRAMUSCULAR | Status: AC
  Filled 2017-05-10: qty 30

## 2017-05-10 MED ORDER — FENTANYL CITRATE (PF) 250 MCG/5ML IJ SOLN
INTRAMUSCULAR | Status: DC | PRN
  Administered 2017-05-10: 50 ug via INTRAVENOUS
  Administered 2017-05-10: 100 ug via INTRAVENOUS

## 2017-05-10 MED ORDER — PREGABALIN 75 MG PO CAPS: 200.0000 mg | ORAL_CAPSULE | Freq: Three times a day (TID) | ORAL | Status: DC

## 2017-05-10 MED ORDER — ACETAMINOPHEN 650 MG RE SUPP: 650.0000 mg | RECTAL | Status: DC | PRN

## 2017-05-10 MED ORDER — DEXMEDETOMIDINE HCL 200 MCG/2ML IV SOLN
INTRAVENOUS | Status: DC | PRN
  Administered 2017-05-10: 12 ug via INTRAVENOUS

## 2017-05-10 MED ORDER — ONDANSETRON HCL 4 MG/2ML IJ SOLN: 4.0000 mg | Freq: Four times a day (QID) | INTRAMUSCULAR | Status: DC | PRN

## 2017-05-10 MED ORDER — OXYCODONE HCL 5 MG PO TABS: 5.0000 mg | ORAL_TABLET | Freq: Once | ORAL | Status: DC | PRN

## 2017-05-10 MED ORDER — ROCURONIUM BROMIDE 10 MG/ML (PF) SYRINGE
PREFILLED_SYRINGE | INTRAVENOUS | Status: DC | PRN
  Administered 2017-05-10: 50 mg via INTRAVENOUS

## 2017-05-10 MED ORDER — 0.9 % SODIUM CHLORIDE (POUR BTL) OPTIME
TOPICAL | Status: DC | PRN
  Administered 2017-05-10: 1000 mL

## 2017-05-10 MED ORDER — ROCURONIUM BROMIDE 10 MG/ML (PF) SYRINGE
PREFILLED_SYRINGE | INTRAVENOUS | Status: AC
  Filled 2017-05-10: qty 5

## 2017-05-10 MED ORDER — CHLORHEXIDINE GLUCONATE 4 % EX LIQD: 60.0000 mL | Freq: Once | CUTANEOUS | Status: DC

## 2017-05-10 MED ORDER — FENTANYL CITRATE (PF) 100 MCG/2ML IJ SOLN
25.0000 ug | INTRAMUSCULAR | Status: DC | PRN
Start: 2017-05-10 — End: 2017-05-10

## 2017-05-10 MED ORDER — PHENYLEPHRINE HCL 10 MG/ML IJ SOLN
INTRAMUSCULAR | Status: DC | PRN
  Administered 2017-05-10: 20 ug/min via INTRAVENOUS

## 2017-05-10 MED ORDER — CEFAZOLIN SODIUM-DEXTROSE 2-4 GM/100ML-% IV SOLN
2.0000 g | INTRAVENOUS | Status: AC
  Administered 2017-05-10: 2 g via INTRAVENOUS

## 2017-05-10 SURGICAL SUPPLY — 42 items
BENZOIN TINCTURE PRP APPL 2/3 (GAUZE/BANDAGES/DRESSINGS) ×2 IMPLANT
BUR ROUND FLUTED 4 SOFT TCH (BURR) ×2 IMPLANT
CANISTER SUCT 3000ML PPV (MISCELLANEOUS) ×2 IMPLANT
CLSR STERI-STRIP ANTIMIC 1/2X4 (GAUZE/BANDAGES/DRESSINGS) ×2 IMPLANT
COVER SURGICAL LIGHT HANDLE (MISCELLANEOUS) ×2 IMPLANT
DECANTER SPIKE VIAL GLASS SM (MISCELLANEOUS) ×2 IMPLANT
DRAPE HALF SHEET 40X57 (DRAPES) ×4 IMPLANT
DRAPE MICROSCOPE LEICA (MISCELLANEOUS) ×2 IMPLANT
DRAPE SURG 17X23 STRL (DRAPES) ×2 IMPLANT
DRSG MEPILEX BORDER 4X4 (GAUZE/BANDAGES/DRESSINGS) IMPLANT
DRSG MEPILEX BORDER 4X8 (GAUZE/BANDAGES/DRESSINGS) ×2 IMPLANT
DURAPREP 26ML APPLICATOR (WOUND CARE) ×2 IMPLANT
ELECT REM PT RETURN 9FT ADLT (ELECTROSURGICAL) ×2
ELECTRODE REM PT RTRN 9FT ADLT (ELECTROSURGICAL) ×1 IMPLANT
FLOSEAL 10ML (HEMOSTASIS) ×2 IMPLANT
GLOVE BIOGEL PI IND STRL 8 (GLOVE) ×2 IMPLANT
GLOVE BIOGEL PI INDICATOR 8 (GLOVE) ×2
GLOVE ORTHO TXT STRL SZ7.5 (GLOVE) ×4 IMPLANT
GOWN STRL REUS W/ TWL LRG LVL3 (GOWN DISPOSABLE) ×2 IMPLANT
GOWN STRL REUS W/ TWL XL LVL3 (GOWN DISPOSABLE) ×1 IMPLANT
GOWN STRL REUS W/TWL 2XL LVL3 (GOWN DISPOSABLE) ×2 IMPLANT
GOWN STRL REUS W/TWL LRG LVL3 (GOWN DISPOSABLE) ×2
GOWN STRL REUS W/TWL XL LVL3 (GOWN DISPOSABLE) ×1
KIT BASIN OR (CUSTOM PROCEDURE TRAY) ×2 IMPLANT
KIT ROOM TURNOVER OR (KITS) ×2 IMPLANT
MANIFOLD NEPTUNE II (INSTRUMENTS) ×2 IMPLANT
NEEDLE HYPO 25GX1X1/2 BEV (NEEDLE) ×2 IMPLANT
NEEDLE SPNL 18GX3.5 QUINCKE PK (NEEDLE) ×2 IMPLANT
NS IRRIG 1000ML POUR BTL (IV SOLUTION) ×2 IMPLANT
PACK LAMINECTOMY ORTHO (CUSTOM PROCEDURE TRAY) ×2 IMPLANT
PAD ARMBOARD 7.5X6 YLW CONV (MISCELLANEOUS) ×4 IMPLANT
PATTIES SURGICAL .5 X.5 (GAUZE/BANDAGES/DRESSINGS) IMPLANT
PATTIES SURGICAL .75X.75 (GAUZE/BANDAGES/DRESSINGS) ×2 IMPLANT
SUT VIC AB 0 CT1 27 (SUTURE) ×1
SUT VIC AB 0 CT1 27XBRD ANBCTR (SUTURE) ×1 IMPLANT
SUT VIC AB 1 CTX 36 (SUTURE) ×2
SUT VIC AB 1 CTX36XBRD ANBCTR (SUTURE) ×2 IMPLANT
SUT VIC AB 2-0 CT1 27 (SUTURE) ×2
SUT VIC AB 2-0 CT1 TAPERPNT 27 (SUTURE) ×2 IMPLANT
SUT VIC AB 3-0 X1 27 (SUTURE) ×2 IMPLANT
TOWEL OR 17X24 6PK STRL BLUE (TOWEL DISPOSABLE) ×2 IMPLANT
TOWEL OR 17X26 10 PK STRL BLUE (TOWEL DISPOSABLE) ×2 IMPLANT

## 2017-05-10 NOTE — Op Note (Signed)
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  Right L3-4 disc protrusion.  Postop diagnosis: Same  Procedure: L3-4 decompression for spinal stenosis centrally and lateral recess with partial facetectomies bilaterally and right L3-4 microdiscectomy.  Surgeon: Annell GreeningMark Miquel Stacks MD  Assistant: Zonia KiefJames Owens PA-C medically necessary and present for the entire procedure  Anesthesia: General plus Marcaine local  Procedure: After induction general anesthesia Ancef prophylaxis prone positioning chest rolls careful padding and positioning arms at 9090 calf pumpers, DuraPrep squaring the area with towels Betadine Steri-Drape application laminectomy sheets and drapes timeout procedure was completed.  Needle localization with spinal needle over the L3-4 level based on palpable landmarks with the old scar outlined with sterile skin marker.  Incision was made proximal to the old incision 3 cm overlying the localization.  Subperiosteal dissection onto the lamina and placement of a McCollum retractor with Coker clamps placed on the level of plan decompression confirmed with a second x-ray and marking the bone with purple marker.  L3 laminectomy was performed removing the spinous process thinning the lamina with the bur thinning the top portion of L4.  There was extensive scar tissue in the lateral gutter on the right side from previous surgery at L4-5 done in the  1980s by Dr. Rosalie DoctorSteve Robinson.  Patient has had epidural steroid injections were crystals present atrophic ligamentum changes and 2-3 mm anterolisthesis that was stable on flexion-extension preoperative x-rays.  Overhanging spurs are present off the facet and partial facetectomy was performed right and left removing overhanging spurs from the facet contributing to the lateral recess stenosis.  Ligament were removed decompressing the central canal where there is central stenosis.  The dura was intact carefully  protected.  Operative microscope was used for the microdissection and microdiscectomy on the right side where there was a disc protrusion.  Prominent epidural veins were coagulated with the bipolar cautery and Surgi-Flo was used the lateral gutters.  Passes were made on the right side pituitaries up straight and Epstein curettes and regular pituitary decompressing the disc bulge on the right side.  On the left side the disc was firm and discectomy was not needed.  Additional inspection of the lateral gutters removing some remaining chunks of ligament at the level of pedicle was performed.  Nerve root was free both right and left.  She is epidural fat present cephalad the area of stenosis was corrected and the dural tube was round with no prominence anterior to the dura with palpation with the dural separator.  Due to the previous epidural steroids the dura was scarred down to the disc with prominent veins which had to be coagulated and once this area was freed up microdiscectomy was performed.  Dura was intact , careful irrigation in the lateral gutters hemostasis was obtained Surgi-Flo was placed removed after 2-3 minutes.  Gutters were dry standard layer closure with #1 Vicryl 2-0 Vicryl subtendinous tissue skin staple closure postop dressing transferred to recovery room.  Instrument count needle count was correct.

## 2017-05-10 NOTE — Transfer of Care (Signed)
Immediate Anesthesia Transfer of Care Note  Patient: [name]  Procedure(s) Performed: L3-4 DECOMPRESSION, RIGHT MICRODISCECTOMY (N/A )  Patient Location: PACU  Anesthesia Type:General  Level of Consciousness: awake, oriented, drowsy and patient cooperative  Airway & Oxygen Therapy: Patient Spontanous Breathing and Patient connected to face mask oxygen  Post-op Assessment: Report given to RN and Post -op Vital signs reviewed and stable  Post vital signs: Reviewed and stable  Last Vitals:  Vitals:   05/10/17 1708 05/10/17 1709  BP:  (!) 146/71  Pulse: 92 91  Resp:  (!) 27  Temp:  (!) 36.1 C  SpO2: 98% 99%    Last Pain:  Vitals:   05/10/17 1709  TempSrc:   PainSc: Asleep         Complications: No apparent anesthesia complications

## 2017-05-10 NOTE — H&P (Signed)
[name] is an 77 y.o. female.   Chief Complaint: Low back pain and right lower extremity radiculopathy   HPI: Patient with history of right L3-4 HNP/stenosis and the above complaint presents for surgical intervention.  Progressively worsening symptoms.  Failed conservative treatment.  Past Medical History:  Diagnosis Date  . Anxiety   . Arthritis    hands, back   . Chronic back pain   . Diverticulosis   . Fatty liver   . Fibromyalgia   . GERD (gastroesophageal reflux disease) 2006   EGD by Dr. Lovell SheehanJenkins, reports that she has had reflux quite markedly in the past but its improved since taking medicine   . Headache    use to have migraines when she was younger    . Hemorrhage after delivery of fetus   . Hepatitis C antibody test positive    negative pcr  . Hyperglycemia   . Hypertension    reports that she takes for diuretic   . Hypothyroid   . S/P colonoscopy 2006   Dr. Lovell SheehanJenkins    Past Surgical History:  Procedure Laterality Date  . APPENDECTOMY    . BREAST BIOPSY Right   . cataracts Bilateral   . CHOLECYSTECTOMY  1998  . COLONOSCOPY WITH PROPOFOL N/A 04/26/2014   Procedure: COLONOSCOPY WITH PROPOFOL;  Surgeon: Corbin Adeobert M Rourk, MD;  Location: [facility];  Service: Endoscopy;  Laterality: N/A;  0939 in cecum, total withdrawal time, 10 min  . LUMBAR DISC SURGERY  1980's  . TOTAL ABDOMINAL HYSTERECTOMY W/ BILATERAL SALPINGOOPHORECTOMY      Family History  Problem Relation Age of Onset  . Brain cancer Father   . Coronary artery disease Mother   . Prostate cancer Brother   . Cancer Sister        gyn  . Lung cancer Brother   . Seizures Son   . Pancreatitis Daughter        ?gallstones  . Colon cancer Neg Hx    Social History:  reports that  has never smoked. she has never used smokeless tobacco. She reports that she does not drink alcohol or use drugs.  Allergies:  Allergies  Allergen Reactions  . Sulfonamide Derivatives Shortness Of Breath and Rash    Rash on chest   . Adhesive [Tape]     Creates skin tears    Medications Prior to Admission  Medication Sig Dispense Refill  . acetaminophen (TYLENOL) 500 MG tablet Take 1,000 mg by mouth 2 (two) times daily as needed for moderate pain or headache.    Marland Kitchen. aspirin 81 MG tablet Take 81 mg by mouth at bedtime.     Marland Kitchen. b complex vitamins tablet Take 1 tablet by mouth daily.    . diphenhydrAMINE (BENADRYL) 25 MG tablet Take 25 mg by mouth daily as needed for allergies.    . DULoxetine (CYMBALTA) 60 MG capsule Take 60 mg by mouth daily before breakfast.     . fenofibrate (TRICOR) 145 MG tablet Take 145 mg by mouth daily.    . fish oil-omega-3 fatty acids 1000 MG capsule Take 1 g by mouth daily.     . hydrochlorothiazide (MICROZIDE) 12.5 MG capsule Take 12.5 mg by mouth daily.     Marland Kitchen. levothyroxine (SYNTHROID, LEVOTHROID) 100 MCG tablet Take 100 mcg by mouth daily before breakfast.    . Lidocaine 4 % PTCH Apply 1 patch topically daily as needed (pain).    . meloxicam (MOBIC) 15 MG tablet Take 15 mg by  mouth daily.     . Multiple Vitamin (MULTIVITAMIN) capsule Take 1 capsule by mouth daily.      Marland Kitchen omeprazole (PRILOSEC) 20 MG capsule Take 20 mg by mouth daily.     . pregabalin (LYRICA) 200 MG capsule Take 200 mg by mouth 3 (three) times daily.     Marland Kitchen zolpidem (AMBIEN CR) 12.5 MG CR tablet Take 12.5 mg by mouth at bedtime.     . ciprofloxacin (CIPRO) 500 MG tablet Take one tablet by mouth twice daily x 5 days. 10 tablet 0    No results found for this or any previous visit (from the past 48 hour(s)). No results found.  Review of Systems  Constitutional: Negative.   HENT: Negative.   Respiratory: Negative.   Cardiovascular: Negative.   Genitourinary: Negative.   Musculoskeletal: Positive for back pain.  Skin: Negative.   Neurological: Negative.   Psychiatric/Behavioral: Negative.     There were no vitals taken for this visit. Physical Exam  Constitutional: She is oriented to person, place, and time. No  distress.  HENT:  Head: Normocephalic and atraumatic.  Eyes: Pupils are equal, round, and reactive to light.  Neck: Normal range of motion.  Respiratory: No respiratory distress.  GI: She exhibits no distension.  Musculoskeletal:  Ortho Exam patient can ambulate with heel toe gait.  Step up on a step only using first her  left foot.  Hip is slightly weak on the right.  Healed lumbar incision at the L4-5 level.  Sciatic notch tenderness.  Reverse straight leg raising.  Pulses palpable.  Neurological: She is alert and oriented to person, place, and time.  Skin: Skin is warm and dry.  Psychiatric: She has a normal mood and affect.     Assessment/Plan L3-4 stenosis/HNP    We will proceed with L3-4 DECOMPRESSION, RIGHT MICRODISCECTOMY as scheduled.  Surgical procedure along with possible risk and complications discussed.  All questions answered. Zonia Kief, PA-C 05/10/2017, 12:21 PM

## 2017-05-10 NOTE — Plan of Care (Signed)
  Progressing Safety: Ability to remain free from injury will improve 05/10/2017 1945 - Progressing by Mel AlmondAguilar, Dorla Guizar D, RN Activity: Ability to avoid complications of mobility impairment will improve 05/10/2017 1945 - Progressing by Mel AlmondAguilar, Nikya Busler D, RN Ability to tolerate increased activity will improve 05/10/2017 1945 - Progressing by Mel AlmondAguilar, Alexi Geibel D, RN Will remain free from falls 05/10/2017 1945 - Progressing by Threasa BeardsAguilar, Tattianna Schnarr D, RN Bowel/Gastric: Gastrointestinal status for postoperative course will improve 05/10/2017 1945 - Progressing by Mel AlmondAguilar, Tamera Pingley D, RN Education: Ability to verbalize activity precautions or restrictions will improve 05/10/2017 1945 - Progressing by Threasa BeardsAguilar, Paxtyn Boyar D, RN Knowledge of the prescribed therapeutic regimen will improve 05/10/2017 1945 - Progressing by Mel AlmondAguilar, Jackey Housey D, RN Understanding of discharge needs will improve 05/10/2017 1945 - Progressing by Mel AlmondAguilar, Shardae Kleinman D, RN Physical Regulation: Ability to maintain clinical measurements within normal limits will improve 05/10/2017 1945 - Progressing by Mel AlmondAguilar, Dhruv Christina D, RN Postoperative complications will be avoided or minimized 05/10/2017 1945 - Progressing by Mel AlmondAguilar, De Jaworski D, RN Diagnostic test results will improve 05/10/2017 1945 - Progressing by Mel AlmondAguilar, Jas Betten D, RN Pain Management: Pain level will decrease 05/10/2017 1945 - Progressing by Mel AlmondAguilar, Khamia Stambaugh D, RN Skin Integrity: Signs of wound healing will improve 05/10/2017 1945 - Progressing by Threasa BeardsAguilar, Meldon Hanzlik D, RN Health Behavior/Discharge Planning: Identification of resources available to assist in meeting health care needs will improve 05/10/2017 1945 - Progressing by Mel AlmondAguilar, Aimar Shrewsbury D, RN Bladder/Genitourinary: Urinary functional status for postoperative course will improve 05/10/2017 1945 - Progressing by Mel AlmondAguilar, Jasmeet Gehl D, RN

## 2017-05-10 NOTE — Anesthesia Postprocedure Evaluation (Signed)
Anesthesia Post Note  Patient: [name]  Procedure(s) Performed: L3-4 DECOMPRESSION, RIGHT MICRODISCECTOMY (N/A )     Patient location during evaluation: PACU Anesthesia Type: General Level of consciousness: awake and alert, patient cooperative and oriented Pain management: pain level controlled Vital Signs Assessment: post-procedure vital signs reviewed and stable Respiratory status: spontaneous breathing, nonlabored ventilation, respiratory function stable and patient connected to nasal cannula oxygen Cardiovascular status: blood pressure returned to baseline and stable Postop Assessment: no apparent nausea or vomiting Anesthetic complications: no    Last Vitals:  Vitals:   05/10/17 1735 05/10/17 1800  BP: (!) 143/75 (!) 155/81  Pulse: 86 91  Resp: 19 18  Temp: 36.7 C 36.8 C  SpO2: 93% 95%    Last Pain:  Vitals:   05/10/17 1940  TempSrc:   PainSc: 2                  Kenyona Rena,E. Romeka Scifres

## 2017-05-10 NOTE — Anesthesia Preprocedure Evaluation (Addendum)
Anesthesia Evaluation  Patient identified by MRN, date of birth, ID band Patient awake    Reviewed: Allergy & Precautions, NPO status , Patient's Chart, lab work & pertinent test results  Airway Mallampati: III  TM Distance: >3 FB     Dental  (+) Edentulous Upper, Partial Lower, Dental Advisory Given   Pulmonary neg pulmonary ROS,    breath sounds clear to auscultation       Cardiovascular hypertension, Pt. on medications  Rhythm:Regular Rate:Normal     Neuro/Psych Anxiety  Neuromuscular disease    GI/Hepatic GERD  Medicated and Controlled,(+) Hepatitis -, C  Endo/Other  Hypothyroidism Obesity  Renal/GU      Musculoskeletal  (+) Arthritis , Fibromyalgia -  Abdominal   Peds  Hematology negative hematology ROS (+)   Anesthesia Other Findings Chronic LBP   Reproductive/Obstetrics                            Anesthesia Physical  Anesthesia Plan  ASA: III  Anesthesia Plan: General   Post-op Pain Management:    Induction: Intravenous  PONV Risk Score and Plan: 3 and Treatment may vary due to age or medical condition, Ondansetron and Dexamethasone  Airway Management Planned: Oral ETT  Additional Equipment: None  Intra-op Plan:   Post-operative Plan: Extubation in OR  Informed Consent: I have reviewed the patients History and Physical, chart, labs and discussed the procedure including the risks, benefits and alternatives for the proposed anesthesia with the patient or authorized representative who has indicated his/her understanding and acceptance.   Dental advisory given  Plan Discussed with: CRNA  Anesthesia Plan Comments:         Anesthesia Quick Evaluation

## 2017-05-10 NOTE — Anesthesia Procedure Notes (Signed)
Procedure Name: Intubation Date/Time: 05/10/2017 3:02 PM Performed by: Valda Favia, CRNA Pre-anesthesia Checklist: Patient identified, Emergency Drugs available, Suction available, Patient being monitored and Timeout performed Patient Re-evaluated:Patient Re-evaluated prior to induction Oxygen Delivery Method: Circle system utilized Preoxygenation: Pre-oxygenation with 100% oxygen Induction Type: IV induction Ventilation: Mask ventilation without difficulty and Oral airway inserted - appropriate to patient size Laryngoscope Size: Mac and 4 Grade View: Grade I Tube type: Oral Tube size: 7.0 mm Number of attempts: 1 Airway Equipment and Method: Stylet Placement Confirmation: ETT inserted through vocal cords under direct vision,  positive ETCO2 and breath sounds checked- equal and bilateral Secured at: 20 cm Tube secured with: Tape Dental Injury: Teeth and Oropharynx as per pre-operative assessment

## 2017-05-10 NOTE — Interval H&P Note (Signed)
History and Physical Interval Note:  05/10/2017 2:16 PM  [name] [name]  has presented today for surgery, with the diagnosis of L3-4 STENOSIS, RIGHT HERNIATED NUCLEUS PULPOSUS  The various methods of treatment have been discussed with the patient and family. After consideration of risks, benefits and other options for treatment, the patient has consented to  Procedure(s): L3-4 DECOMPRESSION, RIGHT MICRODISCECTOMY (N/A) as a surgical intervention .  The patient's history has been reviewed, patient examined, no change in status, stable for surgery.  I have reviewed the patient's chart and labs.  Questions were answered to the patient's satisfaction.     Eldred MangesMark C Amarra Sawyer

## 2017-05-11 ENCOUNTER — Encounter (HOSPITAL_COMMUNITY): Payer: Self-pay | Admitting: Orthopaedic Surgery

## 2017-05-11 DIAGNOSIS — M48061 Spinal stenosis, lumbar region without neurogenic claudication: Secondary | ICD-10-CM | POA: Diagnosis not present

## 2017-05-11 MED ORDER — HYDROCODONE-ACETAMINOPHEN 7.5-325 MG PO TABS: 1.0000 | ORAL_TABLET | ORAL | 0 refills | Status: DC | PRN

## 2017-05-11 NOTE — Discharge Instructions (Signed)
Walk daily , avoid sitting. Lying down on couch or recliner is OK. Walk daily, gradually increase to 1 to 2 miles a day.  OK to shower.

## 2017-05-11 NOTE — Progress Notes (Signed)
Pt doing well. Pt and husband given D/C instructions with Rx's, verbal understanding was provided. Pt's IV was removed prior to D/C. Pt's incision is clean and dry with no sign of infection. Pt D/C'd home via walking @ 0930 per MD order. Pt is stable @ D/C and has no other needs at this time. Rema FendtAshley Chesnie Capell, RN

## 2017-05-11 NOTE — Progress Notes (Signed)
   Subjective: 1 Day Post-Op Procedure(s) (LRB): L3-4 DECOMPRESSION, RIGHT MICRODISCECTOMY (N/A) Patient reports pain as mild and moderate.    Objective: Vital signs in last 24 hours: Temp:  [97 F (36.1 C)-98.6 F (37 C)] 98.6 F (37 C) (02/19 0345) Pulse Rate:  [77-92] 80 (02/19 0345) Resp:  [14-27] 18 (02/19 0345) BP: (126-168)/(65-81) 126/65 (02/19 0345) SpO2:  [93 %-99 %] 96 % (02/19 0345) Weight:  [168 lb (76.2 kg)] 168 lb (76.2 kg) (02/18 1238)  Intake/Output from previous day: 02/18 0701 - 02/19 0700 In: 1050 [I.V.:1050] Out: 75 [Blood:75] Intake/Output this shift: No intake/output data recorded.  No results for input(s): HGB in the last 72 hours. No results for input(s): WBC, RBC, HCT, PLT in the last 72 hours. No results for input(s): NA, K, CL, CO2, BUN, CREATININE, GLUCOSE, CALCIUM in the last 72 hours. No results for input(s): LABPT, INR in the last 72 hours.  Neurologically intact Dg Lumbar Spine 2-3 Views  Result Date: 05/10/2017 CLINICAL DATA:  L3-4 decompression. EXAM: LUMBAR SPINE - 2-3 VIEW COMPARISON:  MRI of March 24, 2017. FINDINGS: Two intraoperative cross-table lateral projections were obtained of the lumbar spine. The first image demonstrates surgical probe directed toward the L3-4 level. Second image demonstrates surgical retractor posterior to the posterior elements of L3 and L4. IMPRESSION: Surgical localization as described above. Electronically Signed   By: Lupita RaiderJames  Green Jr, M.D.   On: 05/10/2017 16:59    Assessment/Plan: 1 Day Post-Op Procedure(s) (LRB): L3-4 DECOMPRESSION, RIGHT MICRODISCECTOMY (N/A) Plan:   Discharge home office [name] 9 days.   Eldred MangesMark C Alacia Rehmann 05/11/2017, 7:20 AM

## 2017-05-14 ENCOUNTER — Telehealth (INDEPENDENT_AMBULATORY_CARE_PROVIDER_SITE_OTHER): Payer: Self-pay

## 2017-05-14 ENCOUNTER — Telehealth (INDEPENDENT_AMBULATORY_CARE_PROVIDER_SITE_OTHER): Payer: Self-pay | Admitting: Orthopaedic Surgery

## 2017-05-14 NOTE — Telephone Encounter (Signed)
There were giving her pain medication around the clock.  They now dented on a as needed basis and stop the Lyrica and she is getting better.  She was oversedated.

## 2017-05-14 NOTE — Telephone Encounter (Signed)
Patient called asked if she should still continue to take the Hydrocodone. Patient also want to know if she should continue to take both Lyrica and Hydrocodone at the same time. The number to contact patient is [phone]

## 2017-05-14 NOTE — Telephone Encounter (Signed)
Pt's husband is concerned is it ok for the pt to take her Vicodin and lyrica together?

## 2017-05-14 NOTE — Telephone Encounter (Signed)
Please advise 

## 2017-05-14 NOTE — Telephone Encounter (Signed)
Pt is taking Lyrica for fibromyalgia. I advised we would not want her to stop this if she was taking it for something other than her back and leg pain. I also advised about weaning the hydrocodone and only taking it as needed for pain. I explained that her pain should be getting better now that she has had surgery. She expressed understanding. Dr. Ophelia CharterYates aware patient will not D/C Lyrica

## 2017-05-20 ENCOUNTER — Encounter (INDEPENDENT_AMBULATORY_CARE_PROVIDER_SITE_OTHER): Payer: Self-pay | Admitting: Orthopaedic Surgery

## 2017-05-20 ENCOUNTER — Ambulatory Visit (INDEPENDENT_AMBULATORY_CARE_PROVIDER_SITE_OTHER): Payer: Medicare Other | Admitting: Orthopaedic Surgery

## 2017-05-20 VITALS — BP 130/63 | HR 88

## 2017-05-20 DIAGNOSIS — Z9889 Other specified postprocedural states: Secondary | ICD-10-CM

## 2017-05-20 DIAGNOSIS — Z981 Arthrodesis status: Secondary | ICD-10-CM | POA: Insufficient documentation

## 2017-05-20 NOTE — Progress Notes (Signed)
Post-Op Visit Note   Patient: [name]           Date of Birth: May 15, 1940           MRN: [id number] Visit Date: 05/20/2017 PCP: Benita StabileHall, John Z, MD   Assessment & Plan: Follow-up L3-4 decompression 05/10/2017.  Having spasms in her back that radiates to her leg last for short period of time and then resolves.  She is ambulating with a cane.  Incision looks good Steri-Strips changed.  Continue walking program and recheck 1 month.  Norco 20 tablets given for pain that she can take 1 at night if needed.  Chief Complaint:  Chief Complaint  Patient presents with  . Lower Back - Routine Post Op   Visit Diagnoses:  1. Status post lumbar spine surgery for decompression of spinal cord     Plan: Walking daily.  Recheck 1 month.  Norco prescribed for pain.  20 tablets Rx.   Follow-Up Instructions: No Follow-up on file.   Orders:  No orders of the defined types were placed in this encounter.  No orders of the defined types were placed in this encounter.   Imaging: No results found.  PMFS History: Patient Active Problem List   Diagnosis Date Noted  . Lumbar stenosis 05/10/2017  . Altered mental status 09/24/2015  . Colon cancer screening   . Encounter for screening colonoscopy 04/04/2014  . Lumbago 05/25/2012  . Abdominal pain 07/31/2010  . Diarrhea 07/31/2010  . GERD 11/19/2008  . FATTY LIVER DISEASE 11/19/2008  . Hypothyroidism 11/15/2008  . OBESITY 11/15/2008  . Fibromyalgia 11/15/2008  . Chronic fatigue 11/15/2008  . CHOLELITHIASIS, HX OF 11/15/2008   Past Medical History:  Diagnosis Date  . Anxiety   . Arthritis    hands, back   . Chronic back pain   . Diverticulosis   . Fatty liver   . Fibromyalgia   . GERD (gastroesophageal reflux disease) 2006   EGD by Dr. Lovell SheehanJenkins, reports that she has had reflux quite markedly in the past but its improved since taking medicine   . Headache    use to have migraines when she was younger    . Hemorrhage after delivery of  fetus   . Hepatitis C antibody test positive    negative pcr  . Hyperglycemia   . Hypertension    reports that she takes for diuretic   . Hypothyroid   . S/P colonoscopy 2006   Dr. Lovell SheehanJenkins    Family History  Problem Relation Age of Onset  . Brain cancer Father   . Coronary artery disease Mother   . Prostate cancer Brother   . Cancer Sister        gyn  . Lung cancer Brother   . Seizures Son   . Pancreatitis Daughter        ?gallstones  . Colon cancer Neg Hx     Past Surgical History:  Procedure Laterality Date  . APPENDECTOMY    . BREAST BIOPSY Right   . cataracts Bilateral   . CHOLECYSTECTOMY  1998  . COLONOSCOPY WITH PROPOFOL N/A 04/26/2014   Procedure: COLONOSCOPY WITH PROPOFOL;  Surgeon: Corbin Adeobert M Rourk, MD;  Location: [facility];  Service: Endoscopy;  Laterality: N/A;  0939 in cecum, total withdrawal time, 10 min  . LUMBAR DISC SURGERY  1980's  . LUMBAR LAMINECTOMY/DECOMPRESSION MICRODISCECTOMY N/A 05/10/2017   Procedure: L3-4 DECOMPRESSION, RIGHT MICRODISCECTOMY;  Surgeon: Eldred MangesYates, Lacrystal Barbe C, MD;  [facility];  Service: Orthopedics;  Laterality: N/A;  . TOTAL ABDOMINAL HYSTERECTOMY W/ BILATERAL SALPINGOOPHORECTOMY     Social History   Occupational History  . 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 Smoking status: Never Smoker  . Smokeless tobacco: Never Used  Substance and Sexual Activity  . Alcohol use: No  . Drug use: No  . Sexual activity: No

## 2017-05-21 NOTE — Discharge Summary (Signed)
Patient ID: [name] MRN: [id number] DOB/AGE: [birth year] 77 y.o.  Admit date: 05/10/2017 Discharge date: 05/21/2017  Admission Diagnoses:  Active Problems:   * No active hospital problems. *   Discharge Diagnoses:  Active Problems:   * No active hospital problems. *  status post Procedure(s): L3-4 DECOMPRESSION, RIGHT MICRODISCECTOMY  Past Medical History:  Diagnosis Date  . Anxiety   . Arthritis    hands, back   . Chronic back pain   . Diverticulosis   . Fatty liver   . Fibromyalgia   . GERD (gastroesophageal reflux disease) 2006   EGD by Dr. Lovell SheehanJenkins, reports that she has had reflux quite markedly in the past but its improved since taking medicine   . Headache    use to have migraines when she was younger    . Hemorrhage after delivery of fetus   . Hepatitis C antibody test positive    negative pcr  . Hyperglycemia   . Hypertension    reports that she takes for diuretic   . Hypothyroid   . S/P colonoscopy 2006   Dr. Lovell SheehanJenkins    Surgeries: Procedure(s): L3-4 DECOMPRESSION, RIGHT MICRODISCECTOMY on 05/10/2017   Consultants:   Discharged Condition: Improved  Hospital Course: [name] is an 77 y.o. 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 Patient failed conservative treatments (please see the history and physical for the specifics) and had severe unremitting pain that affects sleep, daily activities and work/hobbies. After pre-op clearance, the patient was taken to the operating room on 05/10/2017 and underwent  Procedure(s): L3-4 DECOMPRESSION, RIGHT MICRODISCECTOMY.    Patient was given perioperative antibiotics:  Anti-infectives (From admission, onward)   Start     Dose/Rate Route Frequency Ordered Stop   05/10/17 2000  ciprofloxacin (CIPRO) tablet 500 mg  Status:  Discontinued     500 mg Oral 2 times daily 05/10/17 1744 05/10/17 1911   05/10/17 1245  ceFAZolin (ANCEF) IVPB 2g/100 mL premix     2 g 200 mL/hr  over 30 Minutes Intravenous To Short Stay 05/10/17 1230 05/10/17 1508   05/10/17 1238  ceFAZolin (ANCEF) 2-4 GM/100ML-% IVPB    Comments:  Hazlip, Jessica   : cabinet override      05/10/17 1238 05/10/17 1508       Patient was given sequential compression devices and early ambulation to prevent DVT.   Patient benefited maximally from hospital stay and there were no complications. At the time of discharge, the patient was urinating/moving their bowels without difficulty, tolerating a regular diet, pain is controlled with oral pain medications and they have been cleared by PT/OT.   Recent vital signs: No data found.   Recent laboratory studies: No results for input(s): WBC, HGB, HCT, PLT, NA, K, CL, CO2, BUN, CREATININE, GLUCOSE, INR, CALCIUM in the last 72 hours.  Invalid input(s): PT, 2   Discharge Medications:   Allergies as of 05/11/2017      Reactions   Sulfonamide Derivatives Shortness Of Breath, Rash   Rash on chest   Adhesive [tape]    Creates skin tears      Medication List    STOP taking these medications   acetaminophen 500 MG tablet Commonly known as:  TYLENOL     TAKE these medications   aspirin 81 MG tablet Take 81 mg by mouth at bedtime.   b complex vitamins tablet Take 1 tablet by mouth daily.   ciprofloxacin 500 MG tablet Commonly  known as:  CIPRO Take one tablet by mouth twice daily x 5 days.   diphenhydrAMINE 25 MG tablet Commonly known as:  BENADRYL Take 25 mg by mouth daily as needed for allergies.   DULoxetine 60 MG capsule Commonly known as:  CYMBALTA Take 60 mg by mouth daily before breakfast.   fenofibrate 145 MG tablet Commonly known as:  TRICOR Take 145 mg by mouth daily.   fish oil-omega-3 fatty acids 1000 MG capsule Take 1 g by mouth daily.   hydrochlorothiazide 12.5 MG capsule Commonly known as:  MICROZIDE Take 12.5 mg by mouth daily.   HYDROcodone-acetaminophen 7.5-325 MG tablet Commonly known as:  NORCO Take 1 tablet by  mouth every 4 (four) hours as needed for moderate pain ((score 4 to 6)).   levothyroxine 100 MCG tablet Commonly known as:  SYNTHROID, LEVOTHROID Take 100 mcg by mouth daily before breakfast.   Lidocaine 4 % Ptch Apply 1 patch topically daily as needed (pain).   meloxicam 15 MG tablet Commonly known as:  MOBIC Take 15 mg by mouth daily.   multivitamin capsule Take 1 capsule by mouth daily.   omeprazole 20 MG capsule Commonly known as:  PRILOSEC Take 20 mg by mouth daily.   pregabalin 200 MG capsule Commonly known as:  LYRICA Take 200 mg by mouth 3 (three) times daily.   zolpidem 12.5 MG CR tablet Commonly known as:  AMBIEN CR Take 12.5 mg by mouth at bedtime.            Discharge Care Instructions  (From admission, onward)        Start     Ordered   05/11/17 0000  Change dressing     05/11/17 0724      Diagnostic Studies: Dg Chest 2 View  Result Date: 05/04/2017 CLINICAL DATA:  Preoperative evaluation for lumbar surgery EXAM: CHEST  2 VIEW COMPARISON:  09/24/2015 FINDINGS: The heart size and mediastinal contours are within normal limits. Both lungs are clear. The visualized skeletal structures are unremarkable. IMPRESSION: No active cardiopulmonary disease. Electronically Signed   By: Alcide Clever M.D.   On: 05/04/2017 15:41   Dg Lumbar Spine 2-3 Views  Result Date: 05/10/2017 CLINICAL DATA:  L3-4 decompression. EXAM: LUMBAR SPINE - 2-3 VIEW COMPARISON:  MRI of March 24, 2017. FINDINGS: Two intraoperative cross-table lateral projections were obtained of the lumbar spine. The first image demonstrates surgical probe directed toward the L3-4 level. Second image demonstrates surgical retractor posterior to the posterior elements of L3 and L4. IMPRESSION: Surgical localization as described above. Electronically Signed   By: Lupita Raider, M.D.   On: 05/10/2017 16:59    Discharge Instructions    Change dressing   Complete by:  As directed       Follow-up  Information    [name], MD Follow up in 9 day(s).   Specialty:  Orthopedic Surgery Why:  followup in [facility] clinic in 9 days on Thursday Contact information: [address] [place] [address] [phone]           Discharge Plan:  discharge to home  Disposition:     Signed: Zonia Kief  05/21/2017, 4:53 PM

## 2017-06-03 ENCOUNTER — Encounter (INDEPENDENT_AMBULATORY_CARE_PROVIDER_SITE_OTHER): Payer: Self-pay | Admitting: Orthopaedic Surgery

## 2017-06-03 ENCOUNTER — Ambulatory Visit (INDEPENDENT_AMBULATORY_CARE_PROVIDER_SITE_OTHER): Payer: Medicare Other | Admitting: Orthopaedic Surgery

## 2017-06-03 DIAGNOSIS — M7062 Trochanteric bursitis, left hip: Secondary | ICD-10-CM | POA: Diagnosis not present

## 2017-06-03 DIAGNOSIS — Z9889 Other specified postprocedural states: Secondary | ICD-10-CM

## 2017-06-03 MED ORDER — BUPIVACAINE HCL 0.25 % IJ SOLN
2.0000 mL | INTRAMUSCULAR | Status: AC | PRN
  Administered 2017-06-03: 2 mL via INTRA_ARTICULAR

## 2017-06-03 MED ORDER — METHYLPREDNISOLONE ACETATE 40 MG/ML IJ SUSP
40.0000 mg | INTRAMUSCULAR | Status: AC | PRN
  Administered 2017-06-03: 40 mg via INTRA_ARTICULAR

## 2017-06-03 NOTE — Progress Notes (Signed)
Office Visit Note   Patient: [name]           Date of Birth: 05/22/1940           MRN: [id number] Visit Date: 06/03/2017              Requested by: Benita StabileHall, John Z, MD [address] [place], [place] [id number] PCP: Benita StabileHall, John Z, MD   Assessment & Plan: Visit Diagnoses:  1. Trochanteric bursitis, left hip   2. Status post lumbar spine surgery for decompression of spinal cord     Plan: Left trochanteric injection performed recheck 4 weeks.  Follow-Up Instructions: Return in about 4 weeks (around 07/01/2017).   Orders:  No orders of the defined types were placed in this encounter.  No orders of the defined types were placed in this encounter.     Procedures: Large Joint Inj on 06/03/2017 3:05 PM Details: 22 G needle, lateral approach Medications: 2 mL bupivacaine 0.25 %; 40 mg methylPREDNISolone acetate 40 MG/ML      Clinical Data: No additional findings.   Subjective: Chief Complaint  Patient presents with  . Lower Back - Routine Post Op, Follow-up    HPI L3-4 decompression microdiscectomy in the right at L3-4.  She is having considerable pain in her left greater trochanter region.  She forgot her cane at home but is been using it.  Back incisions well-healed both quads are strong.  Normal hip range of motion.  She denies fever or chills.  Review of Systems unchanged from surgery.   Objective: Vital Signs: There were no vitals taken for this visit.  Physical Exam no change other than orthotic exam.  Ortho Exam greater trochanters tender.  Good hip range of motion knees reach full extension sensation is normal at L3-L4-L5 dermatomes.  Specialty Comments:  No specialty comments available.  Imaging: No results found.   PMFS History: Patient Active Problem List   Diagnosis Date Noted  . Status post lumbar spine surgery for decompression of spinal cord 05/20/2017  . Altered mental status 09/24/2015  . Colon cancer screening   . Encounter for  screening colonoscopy 04/04/2014  . Lumbago 05/25/2012  . Abdominal pain 07/31/2010  . Diarrhea 07/31/2010  . GERD 11/19/2008  . FATTY LIVER DISEASE 11/19/2008  . Hypothyroidism 11/15/2008  . OBESITY 11/15/2008  . Fibromyalgia 11/15/2008  . Chronic fatigue 11/15/2008  . CHOLELITHIASIS, HX OF 11/15/2008   Past Medical History:  Diagnosis Date  . Anxiety   . Arthritis    hands, back   . Chronic back pain   . Diverticulosis   . Fatty liver   . Fibromyalgia   . GERD (gastroesophageal reflux disease) 2006   EGD by Dr. Lovell SheehanJenkins, reports that she has had reflux quite markedly in the past but its improved since taking medicine   . Headache    use to have migraines when she was younger    . Hemorrhage after delivery of fetus   . Hepatitis C antibody test positive    negative pcr  . Hyperglycemia   . Hypertension    reports that she takes for diuretic   . Hypothyroid   . S/P colonoscopy 2006   Dr. Lovell SheehanJenkins    Family History  Problem Relation Age of Onset  . Brain cancer Father   . Coronary artery disease Mother   . Prostate cancer Brother   . Cancer Sister        gyn  . Lung cancer Brother   .  Seizures Son   . Pancreatitis Daughter        ?gallstones  . Colon cancer Neg Hx     Past Surgical History:  Procedure Laterality Date  . APPENDECTOMY    . BREAST BIOPSY Right   . cataracts Bilateral   . CHOLECYSTECTOMY  1998  . COLONOSCOPY WITH PROPOFOL N/A 04/26/2014   Procedure: COLONOSCOPY WITH PROPOFOL;  Surgeon: Corbin Ade, MD;  Location: [facility];  Service: Endoscopy;  Laterality: N/A;  0939 in cecum, total withdrawal time, 10 min  . LUMBAR DISC SURGERY  1980's  . LUMBAR LAMINECTOMY/DECOMPRESSION MICRODISCECTOMY N/A 05/10/2017   Procedure: L3-4 DECOMPRESSION, RIGHT MICRODISCECTOMY;  Surgeon: [name], MD;  Location[facility];  Service: Orthopedics;  Laterality: N/A;  . TOTAL ABDOMINAL HYSTERECTOMY W/ BILATERAL SALPINGOOPHORECTOMY     Social History   Occupational  History  . 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 Smoking status: Never Smoker  . Smokeless tobacco: Never Used  Substance and Sexual Activity  . Alcohol use: No  . Drug use: No  . Sexual activity: No

## 2017-07-01 ENCOUNTER — Ambulatory Visit (INDEPENDENT_AMBULATORY_CARE_PROVIDER_SITE_OTHER): Payer: Medicare Other | Admitting: Orthopaedic Surgery

## 2017-07-01 ENCOUNTER — Encounter (INDEPENDENT_AMBULATORY_CARE_PROVIDER_SITE_OTHER): Payer: Self-pay | Admitting: Orthopaedic Surgery

## 2017-07-01 VITALS — BP 144/83 | HR 82

## 2017-07-01 DIAGNOSIS — Z9889 Other specified postprocedural states: Secondary | ICD-10-CM

## 2017-07-01 NOTE — Progress Notes (Signed)
Post-Op Visit Note   Patient: [name]           Date of Birth: 08/01/1940           MRN: [id number] Visit Date: 07/01/2017 PCP: Benita StabileHall, John Z, MD   Assessment & Plan: post decompression L3-4 and microdisc. Right L3-4.     Chief Complaint:  Chief Complaint  Patient presents with  . Lower Back - Routine Post Op   Visit Diagnoses:  1. Status post lumbar spine surgery for decompression of spinal cord     Plan: increased hip , buttocks pain after shopping on concrete for several hrs. Still using cane. Continue walking. ROV if she has increased Sx.   Follow-Up Instructions: Return if symptoms worsen or fail to improve.   Orders:  No orders of the defined types were placed in this encounter.  No orders of the defined types were placed in this encounter.   Imaging: No results found.  PMFS History: Patient Active Problem List   Diagnosis Date Noted  . Status post lumbar spine surgery for decompression of spinal cord 05/20/2017  . Altered mental status 09/24/2015  . Colon cancer screening   . Encounter for screening colonoscopy 04/04/2014  . Lumbago 05/25/2012  . Abdominal pain 07/31/2010  . Diarrhea 07/31/2010  . GERD 11/19/2008  . FATTY LIVER DISEASE 11/19/2008  . Hypothyroidism 11/15/2008  . OBESITY 11/15/2008  . Fibromyalgia 11/15/2008  . Chronic fatigue 11/15/2008  . CHOLELITHIASIS, HX OF 11/15/2008   Past Medical History:  Diagnosis Date  . Anxiety   . Arthritis    hands, back   . Chronic back pain   . Diverticulosis   . Fatty liver   . Fibromyalgia   . GERD (gastroesophageal reflux disease) 2006   EGD by Dr. Lovell SheehanJenkins, reports that she has had reflux quite markedly in the past but its improved since taking medicine   . Headache    use to have migraines when she was younger    . Hemorrhage after delivery of fetus   . Hepatitis C antibody test positive    negative pcr  . Hyperglycemia   . Hypertension    reports that she takes for diuretic   .  Hypothyroid   . S/P colonoscopy 2006   Dr. Lovell SheehanJenkins    Family History  Problem Relation Age of Onset  . Brain cancer Father   . Coronary artery disease Mother   . Prostate cancer Brother   . Cancer Sister        gyn  . Lung cancer Brother   . Seizures Son   . Pancreatitis Daughter        ?gallstones  . Colon cancer Neg Hx     Past Surgical History:  Procedure Laterality Date  . APPENDECTOMY    . BREAST BIOPSY Right   . cataracts Bilateral   . CHOLECYSTECTOMY  1998  . COLONOSCOPY WITH PROPOFOL N/A 04/26/2014   Procedure: COLONOSCOPY WITH PROPOFOL;  Surgeon: Corbin Adeobert M Rourk, MD;  Location: [facility];  Service: Endoscopy;  Laterality: N/A;  0939 in cecum, total withdrawal time, 10 min  . LUMBAR DISC SURGERY  1980's  . LUMBAR LAMINECTOMY/DECOMPRESSION MICRODISCECTOMY N/A 05/10/2017   Procedure: L3-4 DECOMPRESSION, RIGHT MICRODISCECTOMY;  Surgeon: Eldred MangesYates, Mohit Zirbes C, MD;  [facility];  Service: Orthopedics;  Laterality: N/A;  . TOTAL ABDOMINAL HYSTERECTOMY W/ BILATERAL SALPINGOOPHORECTOMY     Social History   Occupational History  . Occupation: retired    Comment: [employer] [place]  [employer]  Tobacco Use  . Smoking status: Never Smoker  . Smokeless tobacco: Never Used  Substance and Sexual Activity  . Alcohol use: No  . Drug use: No  . Sexual activity: Never

## 2017-12-07 ENCOUNTER — Other Ambulatory Visit (HOSPITAL_COMMUNITY): Payer: Self-pay | Admitting: Internal Medicine

## 2017-12-07 DIAGNOSIS — Z1231 Encounter for screening mammogram for malignant neoplasm of breast: Secondary | ICD-10-CM

## 2017-12-23 ENCOUNTER — Ambulatory Visit (INDEPENDENT_AMBULATORY_CARE_PROVIDER_SITE_OTHER): Payer: Medicare Other | Admitting: Orthopaedic Surgery

## 2017-12-23 ENCOUNTER — Ambulatory Visit (INDEPENDENT_AMBULATORY_CARE_PROVIDER_SITE_OTHER): Payer: Self-pay

## 2017-12-23 ENCOUNTER — Ambulatory Visit (INDEPENDENT_AMBULATORY_CARE_PROVIDER_SITE_OTHER): Payer: Medicare Other | Admitting: Surgery

## 2017-12-23 ENCOUNTER — Encounter (INDEPENDENT_AMBULATORY_CARE_PROVIDER_SITE_OTHER): Payer: Self-pay | Admitting: Surgery

## 2017-12-23 VITALS — BP 149/89 | HR 76 | Ht 62.0 in | Wt 170.0 lb

## 2017-12-23 DIAGNOSIS — M25561 Pain in right knee: Secondary | ICD-10-CM

## 2017-12-23 DIAGNOSIS — M1711 Unilateral primary osteoarthritis, right knee: Secondary | ICD-10-CM | POA: Diagnosis not present

## 2017-12-23 NOTE — Progress Notes (Signed)
Office Visit Note   Patient: [name]           Date of Birth: 18-Jan-1941           MRN: [id number] Visit Date: 12/23/2017              Requested by: Benita Stabile, MD [address] [place], [place] [address] PCP: Benita Stabile, MD   Assessment & Plan: Visit Diagnoses:  1. Acute pain of right knee   2. Arthritis of right knee     Plan: In hopes of giving patient relief of her right knee pain offered injection.  Patient sent right knee was prepped with Betadine and intra-articular Marcaine/Depo-Medrol injection was performed.  Follow with Dr. Ophelia Charter in 4 weeks for recheck.  I did advise patient to call me in 3 weeks to let me know how she is feeling.  If she continues to be symptomatic I may consider getting an MRI of the right knee to better evaluate the extent of her degenerative changes and also rule out meniscal tear.  Medial lateral compartments did not look too bad on x-ray today so if she does have a meniscal tear she may be a good candidate for outpatient arthroscopy.  Follow-Up Instructions: Return in about 4 weeks (around 01/20/2018) for Dr. Ophelia Charter in [place] clinic.   Orders:  Orders Placed This Encounter  Procedures  . Large Joint Inj  . XR KNEE 3 VIEW RIGHT   No orders of the defined types were placed in this encounter.     Procedures: Large Joint Inj on 12/23/2017 4:53 PM Indications: pain and joint swelling Details: 25 G 1.5 in needle, medial approach Medications: 3 mL lidocaine 1 %; 6 mL bupivacaine 0.25 %; 80 mg methylPREDNISolone acetate 40 MG/ML Consent was given by the patient. Patient was prepped and draped in the usual sterile fashion.       Clinical Data: No additional findings.   Subjective: Chief Complaint  Patient presents with  . Lower Back - Pain    05/10/17 L3-4 Decompression, Right Microdiscectomy  . Right Knee - Pain    HPI  Review of Systems No current cardiac pulmonary GI GU issues  Objective: Vital Signs: BP (!) 149/89    Pulse 76   Ht 5\' 2"  (1.575 m)   Wt 170 lb (77.1 kg)   BMI 31.09 kg/m   Physical Exam  Ortho Exam  Specialty Comments:  No specialty comments available.  Imaging: Xr Knee 3 View Right  Result Date: 12/23/2017 X-ray right knee shows mild to moderate patellofemoral changes seen more on sunrise view.  Medial lateral compartment narrowing but nothing bone-on-bone there.  No acute findings.    PMFS History: Patient Active Problem List   Diagnosis Date Noted  . Status post lumbar spine surgery for decompression of spinal cord 05/20/2017  . Altered mental status 09/24/2015  . Colon cancer screening   . Encounter for screening colonoscopy 04/04/2014  . Lumbago 05/25/2012  . Abdominal pain 07/31/2010  . Diarrhea 07/31/2010  . GERD 11/19/2008  . FATTY LIVER DISEASE 11/19/2008  . Hypothyroidism 11/15/2008  . OBESITY 11/15/2008  . Fibromyalgia 11/15/2008  . Chronic fatigue 11/15/2008  . CHOLELITHIASIS, HX OF 11/15/2008   Past Medical History:  Diagnosis Date  . Anxiety   . Arthritis    hands, back   . Chronic back pain   . Diverticulosis   . Fatty liver   . Fibromyalgia   . GERD (gastroesophageal reflux  disease) 2006   EGD by Dr. Lovell Sheehan, reports that she has had reflux quite markedly in the past but its improved since taking medicine   . Headache    use to have migraines when she was younger    . Hemorrhage after delivery of fetus   . Hepatitis C antibody test positive    negative pcr  . Hyperglycemia   . Hypertension    reports that she takes for diuretic   . Hypothyroid   . S/P colonoscopy 2006   Dr. Lovell Sheehan    Family History  Problem Relation Age of Onset  . Brain cancer Father   . Coronary artery disease Mother   . Prostate cancer Brother   . Cancer Sister        gyn  . Lung cancer Brother   . Seizures Son   . Pancreatitis Daughter        ?gallstones  . Colon cancer Neg Hx     Past Surgical History:  Procedure Laterality Date  . APPENDECTOMY    .  BREAST BIOPSY Right   . cataracts Bilateral   . CHOLECYSTECTOMY  1998  . COLONOSCOPY WITH PROPOFOL N/A 04/26/2014   Procedure: COLONOSCOPY WITH PROPOFOL;  Surgeon: Corbin Ade, MD;  Location: [facility];  Service: Endoscopy;  Laterality: N/A;  0939 in cecum, total withdrawal time, 10 min  . LUMBAR DISC SURGERY  1980's  . LUMBAR LAMINECTOMY/DECOMPRESSION MICRODISCECTOMY N/A 05/10/2017   Procedure: L3-4 DECOMPRESSION, RIGHT MICRODISCECTOMY;  Surgeon: [name], MD;  Location: [facility] OR;  Service: Orthopedics;  Laterality: N/A;  . TOTAL ABDOMINAL HYSTERECTOMY W/ BILATERAL SALPINGOOPHORECTOMY     Social History   Occupational History  . 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 Smoking status: Never Smoker  . Smokeless tobacco: Never Used  Substance and Sexual Activity  . Alcohol use: No  . Drug use: No  . Sexual activity: Never

## 2018-01-07 MED ORDER — BUPIVACAINE HCL 0.25 % IJ SOLN
6.0000 mL | INTRAMUSCULAR | Status: AC | PRN
  Administered 2017-12-23: 6 mL via INTRA_ARTICULAR

## 2018-01-07 MED ORDER — LIDOCAINE HCL 1 % IJ SOLN
3.0000 mL | INTRAMUSCULAR | Status: AC | PRN
  Administered 2017-12-23: 3 mL

## 2018-01-07 MED ORDER — METHYLPREDNISOLONE ACETATE 40 MG/ML IJ SUSP
80.0000 mg | INTRAMUSCULAR | Status: AC | PRN
  Administered 2017-12-23: 80 mg

## 2018-01-13 ENCOUNTER — Encounter (INDEPENDENT_AMBULATORY_CARE_PROVIDER_SITE_OTHER): Payer: Self-pay | Admitting: Orthopaedic Surgery

## 2018-01-13 ENCOUNTER — Ambulatory Visit (INDEPENDENT_AMBULATORY_CARE_PROVIDER_SITE_OTHER): Payer: Medicare Other | Admitting: Orthopaedic Surgery

## 2018-01-13 VITALS — BP 164/82 | HR 81 | Ht 62.0 in | Wt 170.0 lb

## 2018-01-13 DIAGNOSIS — M7061 Trochanteric bursitis, right hip: Secondary | ICD-10-CM | POA: Diagnosis not present

## 2018-01-13 NOTE — Progress Notes (Signed)
Office Visit Note   Patient: [name]           Date of Birth: [birth year]           MRN: [id number] Visit Date: 01/13/2018              Requested by: Benita Stabile, MD [address] [place], [place] [address] PCP: Benita Stabile, MD   Assessment & Plan: Visit Diagnoses:  1. Trochanteric bursitis, right hip   2.  Post L3-4 decompression for spinal stenosis 06/28/2017.  Plan: Right greater trochanteric injection performed with improvement in her pain.  We will see how she does with this if she has increased problems she will return will need to obtain repeat lateral flexion-extension lumbar x-rays and consider repeat imaging.  Follow-Up Instructions: No follow-ups on file.   Orders:  No orders of the defined types were placed in this encounter.  No orders of the defined types were placed in this encounter.     Procedures: Large Joint Inj: R greater trochanter on 01/13/2018 2:39 PM Details: lateral approach Medications: 0.5 mL lidocaine 1 %; 2 mL bupivacaine 0.25 %; 40 mg methylPREDNISolone acetate 40 MG/ML      Clinical Data: No additional findings.   Subjective: Chief Complaint  Patient presents with  . Lower Back - Pain, Follow-up    HPI 77 year old female returns 6 months post decompression surgery At the L3-4 level for spinal stenosis.  She has had some continued pain in her buttocks and her right thigh that radiates down to her knee.  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  Previous knee injection 12/23/2017 did not give her sustained relief.  She is amatory with a cane with a short stride gait.  Her canal stenosis at L3-4 was severe and she had 3 mm anterolisthesis without change on flexion-extension films.  She denies fever chills no associated bowel or bladder symptoms. Review of Systems 14 point update unchanged from 04/01/2017 office visit other than as mentioned in  HPI.  Su Hilt with depression.  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  No acute changes noted.   Objective: Vital Signs: BP (!) 164/82   Pulse 81   Ht 5\' 2"  (1.575 m)   Wt 170 lb (77.1 kg)   BMI 31.09 kg/m   Physical Exam  Constitutional: She is oriented to person, place, and time. She appears well-developed.  HENT:  Head: Normocephalic.  Right Ear: External ear normal.  Left Ear: External ear normal.  Eyes: Pupils are equal, round, and reactive to light.  Neck: No tracheal deviation present. No thyromegaly present.  Cardiovascular: Normal rate.  Pulmonary/Chest: Effort normal.  Abdominal: Soft.  Neurological: She is alert and oriented to person, place, and time.  Skin: Skin is warm and dry.  Psychiatric: She has a normal mood and affect. Her behavior is normal.    Ortho Exam patient has no pain with hip range of motion lumbar incisions well-healed.  Mild sciatic notch tenderness on the right negative on the left.  Knees reach full extension no quad weakness abductors are strong.  Exquisite tenderness over the right greater trochanter negative on the left.  She ambulates with a cane.  Knee and ankle jerk are intact.  Specialty Comments:  No specialty comments available.  Imaging: No results found.   PMFS History: Patient Active Problem List  Diagnosis Date Noted  . Status post lumbar spine surgery for decompression of spinal cord 05/20/2017  . Altered mental status 09/24/2015  . Colon cancer screening   . Encounter for screening colonoscopy 04/04/2014  . Lumbago 05/25/2012  . Abdominal pain 07/31/2010  . Diarrhea 07/31/2010  . GERD 11/19/2008  . FATTY LIVER DISEASE 11/19/2008  . Hypothyroidism 11/15/2008  . OBESITY 11/15/2008  . Fibromyalgia 11/15/2008  . Chronic fatigue 11/15/2008  . CHOLELITHIASIS, HX OF 11/15/2008   Past Medical History:  Diagnosis Date  . Anxiety   . Arthritis    hands, back   . Chronic back pain   . Diverticulosis   .  Fatty liver   . Fibromyalgia   . GERD (gastroesophageal reflux disease) 2006   EGD by Dr. Lovell Sheehan, reports that she has had reflux quite markedly in the past but its improved since taking medicine   . Headache    use to have migraines when she was younger    . Hemorrhage after delivery of fetus   . Hepatitis C antibody test positive    negative pcr  . Hyperglycemia   . Hypertension    reports that she takes for diuretic   . Hypothyroid   . S/P colonoscopy 2006   Dr. Lovell Sheehan    Family History  Problem Relation Age of Onset  . Brain cancer Father   . Coronary artery disease Mother   . Prostate cancer Brother   . Cancer Sister        gyn  . Lung cancer Brother   . Seizures Son   . Pancreatitis Daughter        ?gallstones  . Colon cancer Neg Hx     Past Surgical History:  Procedure Laterality Date  . APPENDECTOMY    . BREAST BIOPSY Right   . cataracts Bilateral   . CHOLECYSTECTOMY  1998  . COLONOSCOPY WITH PROPOFOL N/A 04/26/2014   Procedure: COLONOSCOPY WITH PROPOFOL;  Surgeon: Corbin Ade, MD;  Location: [facility];  Service: Endoscopy;  Laterality: N/A;  0939 in cecum, total withdrawal time, 10 min  . LUMBAR DISC SURGERY  1980's  . LUMBAR LAMINECTOMY/DECOMPRESSION MICRODISCECTOMY N/A 05/10/2017   Procedure: L3-4 DECOMPRESSION, RIGHT MICRODISCECTOMY;  Surgeon: [name], MD;  [facility] OR;  Service: Orthopedics;  Laterality: N/A;  . TOTAL ABDOMINAL HYSTERECTOMY W/ BILATERAL SALPINGOOPHORECTOMY     Social History   Occupational History  . 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 Smoking status: Never Smoker  . Smokeless tobacco: Never Used  Substance and Sexual Activity  . Alcohol use: No  . Drug use: No  . Sexual activity: Never

## 2018-01-14 ENCOUNTER — Encounter (INDEPENDENT_AMBULATORY_CARE_PROVIDER_SITE_OTHER): Payer: Self-pay | Admitting: Orthopaedic Surgery

## 2018-01-14 MED ORDER — METHYLPREDNISOLONE ACETATE 40 MG/ML IJ SUSP
40.0000 mg | INTRAMUSCULAR | Status: AC | PRN
  Administered 2018-01-13: 40 mg via INTRA_ARTICULAR

## 2018-01-14 MED ORDER — LIDOCAINE HCL 1 % IJ SOLN
0.5000 mL | INTRAMUSCULAR | Status: AC | PRN
  Administered 2018-01-13: .5 mL

## 2018-01-14 MED ORDER — BUPIVACAINE HCL 0.25 % IJ SOLN
2.0000 mL | INTRAMUSCULAR | Status: AC | PRN
  Administered 2018-01-13: 2 mL via INTRA_ARTICULAR

## 2018-01-17 ENCOUNTER — Ambulatory Visit (HOSPITAL_COMMUNITY): Payer: Medicare Other

## 2018-01-20 ENCOUNTER — Encounter (HOSPITAL_COMMUNITY): Payer: Self-pay

## 2018-01-20 ENCOUNTER — Ambulatory Visit (HOSPITAL_COMMUNITY)
Admission: RE | Admit: 2018-01-20 | Discharge: 2018-01-20 | Disposition: A | Discharge status: Home / Self Care | Payer: Medicare Other | Source: Ambulatory Visit | Attending: Internal Medicine | Admitting: Internal Medicine

## 2018-01-20 DIAGNOSIS — Z1231 Encounter for screening mammogram for malignant neoplasm of breast: Secondary | ICD-10-CM

## 2018-03-03 ENCOUNTER — Other Ambulatory Visit (INDEPENDENT_AMBULATORY_CARE_PROVIDER_SITE_OTHER): Payer: Self-pay | Admitting: Orthopaedic Surgery

## 2018-03-03 ENCOUNTER — Encounter (INDEPENDENT_AMBULATORY_CARE_PROVIDER_SITE_OTHER): Payer: Self-pay | Admitting: Orthopaedic Surgery

## 2018-03-03 ENCOUNTER — Ambulatory Visit (INDEPENDENT_AMBULATORY_CARE_PROVIDER_SITE_OTHER): Payer: Self-pay

## 2018-03-03 ENCOUNTER — Ambulatory Visit (INDEPENDENT_AMBULATORY_CARE_PROVIDER_SITE_OTHER): Payer: Medicare Other | Admitting: Orthopaedic Surgery

## 2018-03-03 VITALS — BP 141/73 | HR 87 | Ht 62.0 in | Wt 170.0 lb

## 2018-03-03 DIAGNOSIS — G8929 Other chronic pain: Secondary | ICD-10-CM | POA: Diagnosis not present

## 2018-03-03 DIAGNOSIS — M5440 Lumbago with sciatica, unspecified side: Secondary | ICD-10-CM | POA: Diagnosis not present

## 2018-03-03 NOTE — Addendum Note (Signed)
Addended by: Rogers SeedsYEATTS, Azarel Banner M on: 03/03/2018 10:46 AM   Modules accepted: Orders

## 2018-03-03 NOTE — Progress Notes (Signed)
Office Visit Note   Patient: [name]           Date of Birth: 21-Jul-1940           MRN: [id number] Visit Date: 03/03/2018              Requested by: Benita Stabile, MD [address] [place], [place] [address] PCP: Benita Stabile, MD   Assessment & Plan: Visit Diagnoses:  1. Chronic right-sided low back pain with sciatica, sciatica laterality unspecified     Plan: Lumbar x-rays demonstrate progressive collapse at the L3-4 level with development of anterolisthesis grade 1 since her surgery.  L3 and L4 vertebrae endplates are hitting each other.  No evidence of compression fracture.  Will obtain a CBC sed rate and CRP and MRI scan with and without contrast.  She likely has severe foraminal stenosis at the L3-4 level from progressive collapse.  Office follow-up after MRI scan.  Follow-Up Instructions: Office follow-up after MRI with and without contrast.  Orders:  Orders Placed This Encounter  Procedures  . XR Lumb Spine Flex&Ext Only   No orders of the defined types were placed in this encounter.     Procedures: No procedures performed   Clinical Data: No additional findings.   Subjective: Chief Complaint  Patient presents with  . Lower Back - Pain    05/10/17 L3-4 decompression, right microdiscectomy  . Right Leg - Pain    HPI 77 year old female returns post L3-4 decompression and right microdiscectomy done 05/10/2017.  She states after the immediate postop pain she did well for period of 6 months or so and then has had gradual progressive increased pain with pain on the right buttocks into the right leg.  She has had a knee injection and a bursal injection without sustained relief.  Flexion-extension lumbar x-rays are obtained today.  She denies fever or chills.  She has difficulty getting upright she has been ambulatory with a cane.  She states her right leg gives way at times she is not really noticed numbness in it but does note weakness in her leg.  Review of  Systems review of systems updated unchanged from February 2019 surgery date other than as mentioned above.  Return of claudication symptoms with right thigh weakness and right leg giving way.  Previous lower lumbar surgery back in the 1980s.  L3-4 decompression right microdiscectomy 05/10/2017.   Objective: Vital Signs: BP (!) 141/73   Pulse 87   Ht 5\' 2"  (1.575 m)   Wt 170 lb (77.1 kg)   BMI 31.09 kg/m   Physical Exam Constitutional:      Appearance: She is well-developed.  HENT:     Head: Normocephalic.     Right Ear: External ear normal.     Left Ear: External ear normal.  Eyes:     Pupils: Pupils are equal, round, and reactive to light.  Neck:     Thyroid: No thyromegaly.     Trachea: No tracheal deviation.  Cardiovascular:     Rate and Rhythm: Normal rate.  Pulmonary:     Effort: Pulmonary effort is normal.  Abdominal:     Palpations: Abdomen is soft.  Skin:    General: Skin is warm and dry.  Neurological:     Mental Status: She is alert and oriented to person, place, and time.  Psychiatric:        Behavior: Behavior normal.     Ortho Exam negative straight leg raising  90 degrees right and left.  Mild crepitus right and left knee.  She has some mild quad weakness both right and left on manual testing.  Specialty Comments:  No specialty comments available.  Imaging: No results found.   PMFS History: Patient Active Problem List   Diagnosis Date Noted  . Status post lumbar spine surgery for decompression of spinal cord 05/20/2017  . Altered mental status 09/24/2015  . Colon cancer screening   . Encounter for screening colonoscopy 04/04/2014  . Lumbago 05/25/2012  . Abdominal pain 07/31/2010  . Diarrhea 07/31/2010  . GERD 11/19/2008  . FATTY LIVER DISEASE 11/19/2008  . Hypothyroidism 11/15/2008  . OBESITY 11/15/2008  . Fibromyalgia 11/15/2008  . Chronic fatigue 11/15/2008  . CHOLELITHIASIS, HX OF 11/15/2008   Past Medical History:  Diagnosis Date  .  Anxiety   . Arthritis    hands, back   . Chronic back pain   . Diverticulosis   . Fatty liver   . Fibromyalgia   . GERD (gastroesophageal reflux disease) 2006   EGD by Dr. Lovell SheehanJenkins, reports that she has had reflux quite markedly in the past but its improved since taking medicine   . Headache    use to have migraines when she was younger    . Hemorrhage after delivery of fetus   . Hepatitis C antibody test positive    negative pcr  . Hyperglycemia   . Hypertension    reports that she takes for diuretic   . Hypothyroid   . S/P colonoscopy 2006   Dr. Lovell SheehanJenkins    Family History  Problem Relation Age of Onset  . Brain cancer Father   . Coronary artery disease Mother   . Prostate cancer Brother   . Cancer Sister        gyn  . Lung cancer Brother   . Seizures Son   . Pancreatitis Daughter        ?gallstones  . Colon cancer Neg Hx     Past Surgical History:  Procedure Laterality Date  . APPENDECTOMY    . BREAST BIOPSY Right   . cataracts Bilateral   . CHOLECYSTECTOMY  1998  . COLONOSCOPY WITH PROPOFOL N/A 04/26/2014   Procedure: COLONOSCOPY WITH PROPOFOL;  Surgeon: Corbin Adeobert M Rourk, MD;  Location: [facility];  Service: Endoscopy;  Laterality: N/A;  0939 in cecum, total withdrawal time, 10 min  . LUMBAR DISC SURGERY  1980's  . LUMBAR LAMINECTOMY/DECOMPRESSION MICRODISCECTOMY N/A 05/10/2017   Procedure: L3-4 DECOMPRESSION, RIGHT MICRODISCECTOMY;  Surgeon: Eldred MangesYates,  C, MD;  [facility];  Service: Orthopedics;  Laterality: N/A;  . TOTAL ABDOMINAL HYSTERECTOMY W/ BILATERAL SALPINGOOPHORECTOMY     Social History   Occupational History  . Occupation: retired    Comment: [employer]  Tobacco Use  . Smoking status: Never Smoker  . Smokeless tobacco: Never Used  Substance and Sexual Activity  . Alcohol use: No  . Drug use: No  . Sexual activity: Never

## 2018-03-04 LAB — CBC WITH DIFFERENTIAL/PLATELET
Basophils Absolute: 29 cells/uL (ref 0–200)
Basophils Relative: 0.5 %
Eosinophils Absolute: 80 cells/uL (ref 15–500)
Eosinophils Relative: 1.4 %
HCT: 35.7 % (ref 35.0–45.0)
Hemoglobin: 11.8 g/dL (ref 11.7–15.5)
Lymphs Abs: 2348 cells/uL (ref 850–3900)
MCH: 31.7 pg (ref 27.0–33.0)
MCHC: 33.1 g/dL (ref 32.0–36.0)
MCV: 96 fL (ref 80.0–100.0)
MPV: 11.1 fL (ref 7.5–12.5)
Monocytes Relative: 8.9 %
Neutro Abs: 2736 cells/uL (ref 1500–7800)
Neutrophils Relative %: 48 %
Platelets: 311 10*3/uL (ref 140–400)
RBC: 3.72 10*6/uL — ABNORMAL LOW (ref 3.80–5.10)
RDW: 12.6 % (ref 11.0–15.0)
Total Lymphocyte: 41.2 %
WBC mixed population: 507 cells/uL (ref 200–950)
WBC: 5.7 10*3/uL (ref 3.8–10.8)

## 2018-03-04 LAB — SEDIMENTATION RATE: Sed Rate: 25 mm/h (ref 0–30)

## 2018-03-04 LAB — C-REACTIVE PROTEIN: CRP: 2.3 mg/L (ref <8.0)

## 2018-03-17 ENCOUNTER — Ambulatory Visit (HOSPITAL_COMMUNITY)
Admission: RE | Admit: 2018-03-17 | Discharge: 2018-03-17 | Disposition: A | Discharge status: Home / Self Care | Payer: Medicare Other | Source: Ambulatory Visit | Attending: Orthopaedic Surgery | Admitting: Orthopaedic Surgery

## 2018-03-17 DIAGNOSIS — M48061 Spinal stenosis, lumbar region without neurogenic claudication: Secondary | ICD-10-CM | POA: Insufficient documentation

## 2018-03-17 DIAGNOSIS — M5126 Other intervertebral disc displacement, lumbar region: Secondary | ICD-10-CM | POA: Insufficient documentation

## 2018-03-17 DIAGNOSIS — G8929 Other chronic pain: Secondary | ICD-10-CM | POA: Diagnosis not present

## 2018-03-17 DIAGNOSIS — M5440 Lumbago with sciatica, unspecified side: Secondary | ICD-10-CM | POA: Insufficient documentation

## 2018-03-17 LAB — POCT I-STAT CREATININE: Creatinine, Ser: 1.1 mg/dL — ABNORMAL HIGH (ref 0.44–1.00)

## 2018-03-17 MED ORDER — GADOBUTROL 1 MMOL/ML IV SOLN
7.0000 mL | Freq: Once | INTRAVENOUS | Status: AC | PRN
  Administered 2018-03-17: 7 mL via INTRAVENOUS

## 2018-03-24 ENCOUNTER — Ambulatory Visit (INDEPENDENT_AMBULATORY_CARE_PROVIDER_SITE_OTHER): Payer: Medicare Other | Admitting: Orthopaedic Surgery

## 2018-03-24 ENCOUNTER — Encounter (INDEPENDENT_AMBULATORY_CARE_PROVIDER_SITE_OTHER): Payer: Self-pay | Admitting: Orthopaedic Surgery

## 2018-03-24 VITALS — BP 160/97 | HR 94 | Ht 62.0 in | Wt 170.0 lb

## 2018-03-24 DIAGNOSIS — M48062 Spinal stenosis, lumbar region with neurogenic claudication: Secondary | ICD-10-CM

## 2018-03-24 DIAGNOSIS — M48061 Spinal stenosis, lumbar region without neurogenic claudication: Secondary | ICD-10-CM | POA: Diagnosis not present

## 2018-03-24 DIAGNOSIS — M532X6 Spinal instabilities, lumbar region: Secondary | ICD-10-CM | POA: Diagnosis not present

## 2018-03-24 NOTE — Progress Notes (Signed)
Office Visit Note   Patient: [name]           Date of Birth: 1940-08-27           MRN: [id number] Visit Date: 03/24/2018              Requested by: Benita StabileHall, John Z, MD [address] [place], [place] [id number] PCP: Benita StabileHall, John Z, MD   Assessment & Plan: Visit Diagnoses:  1. Spinal instability of lumbar region   2. Lumbar foraminal stenosis   3. Spinal stenosis of lumbar region with neurogenic claudication     Plan: Post decompression last year patient developed progressive collapse anterolisthesis bilateral pars fractures and recurrent disc herniation at L3-4 with anterolisthesis and foraminal stenosis at L4-5.  Plan would be recurrent decompression at the L3-4 level.  Fusion L3-4, L4-5 with transforaminal interbody fusion with cages pedicle instrumentation for stabilization and correction of her central and foraminal stenosis.  Likely to night stay in the hospital postoperative brace.  She likely will use a walker for a few weeks.  She lives with her husband.  Plan procedure discussed with patient risk surgery including dural tear, pseudoarthrosis hardware problems with nerve root or central compression, hematoma formation, risks associated with anesthesia, infection and reoperation.  Questions were elicited and answered she understands and requests we proceed.  Follow-Up Instructions: No follow-ups on file.   Orders:  No orders of the defined types were placed in this encounter.  No orders of the defined types were placed in this encounter.     Procedures: No procedures performed   Clinical Data: No additional findings.   Subjective: Chief Complaint  Patient presents with  . Lower Back - Pain, Follow-up    MRI Lumbar Review    HPI 78 year old female with recurrent disc herniation right L3-4 with decompression surgery in July 2019 by me.  She did well for 6 months and then has had progressive increasing pain to the point she is not able to ambulate more than 5200  feet cannot stand more than a few minutes.  Scan showed progressive disc collapse at L3-4 with anterolisthesis 7 mm at L3-4 with bilateral pars fractures progressive endplate IMA and recurrent severe foraminal stenosis.  She has foraminal stenosis with anterolisthesis at the adjacent L4-5 level as well.  He is having problems functioning in her house and only gets relief sitting back in recliner.  Now shifted 6 mm at L3-4 with disc extrusion 15 mm fragment in the right lateral gutter causing severe nerve root compression.  She has giving way weakness of her right leg and uses a cane.  She ambulates she is forward flexed at the hips since she has severe pain if she tries to get to an upright position.   Review of Systems L3-4 right microdiscectomy Dr. Roxan Hockeyobinson 1980s, L3-4 decompression right microdiscectomy 05/10/2017 Dr. Ophelia CharterYates, E. coli UTI 1970s, fibromyalgia, GERD, obesity, hypothyroidism, fatigue,  recurrent lumbar stenosis with instability, negative cardiovascular negative respiratory she is a non-smoker.  Otherwise negative is obtains HPI.  14 point review of systems updated.   Objective: Vital Signs: BP (!) 160/97   Pulse 94   Ht 5\' 2"  (1.575 m)   Wt 170 lb (77.1 kg)   BMI 31.09 kg/m   Physical Exam Constitutional:      Appearance: She is well-developed.  HENT:     Head: Normocephalic.     Right Ear: External ear normal.     Left Ear: External ear  normal.  Eyes:     Pupils: Pupils are equal, round, and reactive to light.  Neck:     Thyroid: No thyromegaly.     Trachea: No tracheal deviation.  Cardiovascular:     Rate and Rhythm: Normal rate.  Pulmonary:     Effort: Pulmonary effort is normal.  Abdominal:     Palpations: Abdomen is soft.  Skin:    General: Skin is warm and dry.  Neurological:     Mental Status: She is alert and oriented to person, place, and time.  Psychiatric:        Behavior: Behavior normal.     Ortho Exam patient has one half grade quad weakness on the  right normal on the left.  Anterior tib gastrocsoleus is strong.  Pain with reverse straight leg raising on the right.  Negative straight leg raising 90 degrees right and left.  2+ knee jerk on the left 1+ on the right.  Ankle jerk is 1+ and symmetrical.  Distal pulses are 2+ no rash over exposed skin no pitting edema.  Specialty Comments:  No specialty comments available.  Imaging: CLINICAL DATA:  78 year old female status post surgery in February 2019. Low back pain radiating down the right leg since August.  EXAM: MRI LUMBAR SPINE WITHOUT AND WITH CONTRAST  TECHNIQUE: Multiplanar and multiecho pulse sequences of the lumbar spine were obtained without and with intravenous contrast.  CONTRAST:  7 milliliters Gadavist.  COMPARISON:  Intraoperative images [id number].  Lumbar MRI 03/24/2017.  FINDINGS: Segmentation: Normal on 2014 radiographs which is the same numbering system used on the January MRI.  Alignment: Grade 1 anterolisthesis of L3 on L4 appears mildly increased since January, 3-4 millimeters. Stable grade 1 anterolisthesis of L4 on L5 measuring 5-6 millimeters. Stable trace retrolisthesis of L5 on S1.  Vertebrae: Progressed degenerative endplate changes at L3-L4 since January. Superimposed right eccentric degenerative endplate marrow edema at that level. Postoperative changes there are described below. No other acute osseous abnormality. Normal background bone marrow signal. Intact visible sacrum and SI joints.  Conus medullaris and cauda equina: Conus extends to the L1 level. No lower spinal cord or conus signal abnormality. No abnormal intradural enhancement. No dural thickening.  Paraspinal and other soft tissues: Postoperative changes to the posterior paraspinal soft tissues centered at L3 with no postoperative fluid collection. Negative visible abdominal viscera.  Disc levels:  No lower thoracic spinal stenosis.  Stable L1-L2 disc space loss,  circumferential disc bulge and endplate spurring with small superimposed left paracentral disc extrusion.  Stable L1-L2 level with mild disc bulge.  L3-L4: Sequelae of posterior decompression with moderate residual facet hypertrophy but evidence of bilateral L3 pars fractures since January (series 9 image 7 on the right and image 13 on the left). Progressed and now severe disc space loss with vacuum phenomena. There is a broad-based right subarticular disc extrusion estimated at 15 millimeters best seen on series 8, image 8 and series 6, image 19. Severe right lateral recess stenosis at the descending right L4 nerve level. Moderate to severe right L3 foraminal stenosis is new (series 8, images 5 and 6). Spinal stenosis has regressed following decompression. Stable mild to moderate left L3 foraminal stenosis.  Stable L4-L5 level with anterolisthesis, left eccentric disc and posterior element degeneration with left lateral recess stenosis.  Stable L5-S1 level with left eccentric disc and endplate degeneration affecting the left L5 foramen.  IMPRESSION: 1. Postoperative changes to the posterior elements of L3-L4 since the January MRI with  increased grade 1 spondylolisthesis at that level and probable interval bilateral L3 pars fractures. Superimposed progressed and now severe disc space loss with vacuum phenomena and broad-based right subarticular disc extrusion (15 mm). Up to severe subsequent right L3 foraminal and right L4 nerve level lateral recess stenosis. 2. Other lumbar levels are stable.   Electronically Signed   By: Odessa Fleming M.D.   On: 03/17/2018 15:54   PMFS History: Patient Active Problem List   Diagnosis Date Noted  . 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  . Lumbar foraminal stenosis 03/24/2018  . Status post lumbar spine surgery for decompression of spinal cord 05/20/2017  . Spinal stenosis of lumbar region with neurogenic claudication 05/10/2017   . Altered mental status 09/24/2015  . Colon cancer screening   . Encounter for screening colonoscopy 04/04/2014  . Lumbago 05/25/2012  . Abdominal pain 07/31/2010  . Diarrhea 07/31/2010  . GERD 11/19/2008  . FATTY LIVER DISEASE 11/19/2008  . Hypothyroidism 11/15/2008  . OBESITY 11/15/2008  . Fibromyalgia 11/15/2008  . Chronic fatigue 11/15/2008  . CHOLELITHIASIS, HX OF 11/15/2008   Past Medical History:  Diagnosis Date  . Anxiety   . Arthritis    hands, back   . Chronic back pain   . Diverticulosis   . Fatty liver   . Fibromyalgia   . GERD (gastroesophageal reflux disease) 2006   EGD by Dr. Lovell Sheehan, reports that she has had reflux quite markedly in the past but its improved since taking medicine   . Headache    use to have migraines when she was younger    . Hemorrhage after delivery of fetus   . Hepatitis C antibody test positive    negative pcr  . Hyperglycemia   . Hypertension    reports that she takes for diuretic   . Hypothyroid   . S/P colonoscopy 2006   Dr. Lovell Sheehan    Family History  Problem Relation Age of Onset  . Brain cancer Father   . Coronary artery disease Mother   . Prostate cancer Brother   . Cancer Sister        gyn  . Lung cancer Brother   . Seizures Son   . Pancreatitis Daughter        ?gallstones  . Colon cancer Neg Hx     Past Surgical History:  Procedure Laterality Date  . APPENDECTOMY    . BREAST BIOPSY Right   . cataracts Bilateral   . CHOLECYSTECTOMY  1998  . COLONOSCOPY WITH PROPOFOL N/A 04/26/2014   Procedure: COLONOSCOPY WITH PROPOFOL;  Surgeon: Corbin Ade, MD;  Location: [facility];  Service: Endoscopy;  Laterality: N/A;  0939 in cecum, total withdrawal time, 10 min  . LUMBAR DISC SURGERY  1980's  . LUMBAR LAMINECTOMY/DECOMPRESSION MICRODISCECTOMY N/A 05/10/2017   Procedure: L3-4 DECOMPRESSION, RIGHT MICRODISCECTOMY;  Surgeon: [name], MD;  Location: [facility] OR;  Service: Orthopedics;  Laterality: N/A;  . TOTAL ABDOMINAL  HYSTERECTOMY W/ BILATERAL SALPINGOOPHORECTOMY     Social History   Occupational History  . 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 Smoking status: Never Smoker  . Smokeless tobacco: Never Used  Substance and Sexual Activity  . Alcohol use: No  . Drug use: No  . Sexual activity: Never

## 2018-03-30 ENCOUNTER — Ambulatory Visit (INDEPENDENT_AMBULATORY_CARE_PROVIDER_SITE_OTHER): Payer: Medicare Other | Admitting: Surgery

## 2018-03-30 ENCOUNTER — Encounter (INDEPENDENT_AMBULATORY_CARE_PROVIDER_SITE_OTHER): Payer: Self-pay | Admitting: Surgery

## 2018-03-30 VITALS — BP 136/77 | HR 79 | Temp 98.0°F | Resp 14 | Ht 62.0 in | Wt 170.0 lb

## 2018-03-30 DIAGNOSIS — M532X6 Spinal instabilities, lumbar region: Secondary | ICD-10-CM

## 2018-03-30 DIAGNOSIS — M48061 Spinal stenosis, lumbar region without neurogenic claudication: Secondary | ICD-10-CM

## 2018-03-30 NOTE — Progress Notes (Signed)
78 year old white female with history of L3-4 and L4-5 stenosis comes in for preop evaluation.  Scheduled to have L3-4 and L4-5 transforaminal lumbar interbody fusion.  We are waiting medical clearance.  Today history and physical performed.  Surgical procedure discussed in detail along with potential rehab/recovery time.  Patient's husband present today.  I did recommend that they go check out [facility] in [place] just in case a short rehab placement is indicated postop.

## 2018-04-04 ENCOUNTER — Telehealth (INDEPENDENT_AMBULATORY_CARE_PROVIDER_SITE_OTHER): Payer: Self-pay | Admitting: Orthopaedic Surgery

## 2018-04-04 NOTE — Telephone Encounter (Signed)
Patient calling wanting to know if she can do her recovery at [facility] on the 4th floor.  She had spoken to [facility] about this. She is also asking how long would she be there.  Patient lives in [place] and does not want to use [facility].

## 2018-04-05 NOTE — Telephone Encounter (Signed)
noted 

## 2018-04-05 NOTE — Telephone Encounter (Signed)
I called and discussed.  Likely after surgery she should be able to go home since her leg weakness should be significantly improved where she is unstable.  If for some reason she has problems with mobility then we can look at other options.  She states she would prefer to go home.FYI

## 2018-04-05 NOTE — Telephone Encounter (Signed)
Please advise 

## 2018-04-06 ENCOUNTER — Encounter (HOSPITAL_COMMUNITY): Payer: Self-pay

## 2018-04-06 ENCOUNTER — Other Ambulatory Visit: Payer: Self-pay

## 2018-04-06 ENCOUNTER — Ambulatory Visit (INDEPENDENT_AMBULATORY_CARE_PROVIDER_SITE_OTHER): Payer: Medicare Other | Admitting: Surgery

## 2018-04-06 ENCOUNTER — Encounter (HOSPITAL_COMMUNITY)
Admission: RE | Admit: 2018-04-06 | Discharge: 2018-04-06 | Disposition: A | Discharge status: Home / Self Care | Payer: Medicare Other | Source: Ambulatory Visit | Attending: Orthopaedic Surgery | Admitting: Orthopaedic Surgery

## 2018-04-06 DIAGNOSIS — Z01812 Encounter for preprocedural laboratory examination: Secondary | ICD-10-CM | POA: Insufficient documentation

## 2018-04-06 HISTORY — DX: Depression, unspecified: F32.A

## 2018-04-06 HISTORY — DX: Major depressive disorder, single episode, unspecified: F32.9

## 2018-04-06 HISTORY — DX: Prediabetes: R73.03

## 2018-04-06 LAB — URINALYSIS, ROUTINE W REFLEX MICROSCOPIC
Bilirubin Urine: NEGATIVE
Glucose, UA: NEGATIVE mg/dL
Hgb urine dipstick: NEGATIVE
Ketones, ur: NEGATIVE mg/dL
Nitrite: POSITIVE — AB
Protein, ur: NEGATIVE mg/dL
Specific Gravity, Urine: 1.017 (ref 1.005–1.030)
pH: 6 (ref 5.0–8.0)

## 2018-04-06 LAB — CBC
HCT: 44.1 % (ref 36.0–46.0)
Hemoglobin: 14 g/dL (ref 12.0–15.0)
MCH: 32.6 pg (ref 26.0–34.0)
MCHC: 31.7 g/dL (ref 30.0–36.0)
MCV: 102.6 fL — ABNORMAL HIGH (ref 80.0–100.0)
Platelets: 413 10*3/uL — ABNORMAL HIGH (ref 150–400)
RBC: 4.3 MIL/uL (ref 3.87–5.11)
RDW: 12.7 % (ref 11.5–15.5)
WBC: 8 10*3/uL (ref 4.0–10.5)
nRBC: 0 % (ref 0.0–0.2)

## 2018-04-06 LAB — COMPREHENSIVE METABOLIC PANEL
ALT: 25 U/L (ref 0–44)
AST: 40 U/L (ref 15–41)
Albumin: 4.6 g/dL (ref 3.5–5.0)
Alkaline Phosphatase: 39 U/L (ref 38–126)
Anion gap: 14 (ref 5–15)
BUN: 23 mg/dL (ref 8–23)
CO2: 22 mmol/L (ref 22–32)
Calcium: 10.1 mg/dL (ref 8.9–10.3)
Chloride: 105 mmol/L (ref 98–111)
Creatinine, Ser: 0.98 mg/dL (ref 0.44–1.00)
GFR calc Af Amer: >60 mL/min (ref >60)
GFR calc non Af Amer: 56 mL/min — ABNORMAL LOW (ref >60)
Glucose, Bld: 92 mg/dL (ref 70–99)
Potassium: 3.6 mmol/L (ref 3.5–5.1)
Sodium: 141 mmol/L (ref 135–145)
Total Bilirubin: 0.7 mg/dL (ref 0.3–1.2)
Total Protein: 8 g/dL (ref 6.5–8.1)

## 2018-04-06 LAB — SURGICAL PCR SCREEN
MRSA, PCR: NEGATIVE
Staphylococcus aureus: NEGATIVE

## 2018-04-06 LAB — PROTIME-INR
INR: 1.04
Prothrombin Time: 13.5 seconds (ref 11.4–15.2)

## 2018-04-06 LAB — GLUCOSE, CAPILLARY: Glucose-Capillary: 144 mg/dL — ABNORMAL HIGH (ref 70–99)

## 2018-04-06 LAB — TYPE AND SCREEN
ABO/RH(D): A POS
Antibody Screen: NEGATIVE

## 2018-04-06 LAB — ABO/RH: ABO/RH(D): A POS

## 2018-04-06 LAB — APTT: aPTT: 46 seconds — ABNORMAL HIGH (ref 24–36)

## 2018-04-06 NOTE — Progress Notes (Signed)
inboxed surgeon regarding +UA.

## 2018-04-06 NOTE — Pre-Procedure Instructions (Signed)
[name]  04/06/2018      [facility] - [place], [place] - [address] [place] [address] Phone: [phone] Fax: [phone]    Your procedure is scheduled on Wednesday January 22nd.  Report to [facility] Admitting at 1030 A.M.  Call this number if you have problems the morning of surgery:  [phone]   Remember:  Do not eat or drink after midnight.    Take these medicines the morning of surgery with A SIP OF WATER  acetaminophen (TYLENOL)  If needed DULoxetine (CYMBALTA) fenofibrate (TRICOR) levothyroxine (SYNTHROID, LEVOTHROID)  omeprazole (PRILOSEC) pregabalin (LYRICA)   Follow your surgeon's instructions on when to stop Asprin.  If no instructions were given by your surgeon then you will need to call the office to get those instructions.    7 days prior to surgery STOP taking any meloxicam (MOBIC), Aspirin (unless otherwise instructed by your surgeon), Aleve, Naproxen, Ibuprofen, Motrin, Advil, Goody's, BC's, all herbal medications, fish oil, and all vitamins.     Do not wear jewelry, make-up or nail polish.  Do not wear lotions, powders, or perfumes, or deodorant.  Do not shave 48 hours prior to surgery.  Men may shave face and neck.  Do not bring valuables to the hospital.  [facility] is not responsible for any belongings or valuables.  Contacts, dentures or bridgework may not be worn into surgery.  Leave your suitcase in the car.  After surgery it may be brought to your room.  For patients admitted to the hospital, discharge time will be determined by your treatment team.  Patients discharged the day of surgery will not be allowed to drive home.    [facility]- Preparing For Surgery  Before surgery, you can play an important role. Because skin is not sterile, your skin needs to be as free of germs as possible. You can reduce the number of germs on your skin by washing with CHG (chlorahexidine  gluconate) Soap before surgery.  CHG is an antiseptic cleaner which kills germs and bonds with the skin to continue killing germs even after washing.    Oral Hygiene is also important to reduce your risk of infection.  Remember - BRUSH YOUR TEETH THE MORNING OF SURGERY WITH YOUR REGULAR TOOTHPASTE  Please do not use if you have an allergy to CHG or antibacterial soaps. If your skin becomes reddened/irritated stop using the CHG.  Do not shave (including legs and underarms) for at least 48 hours prior to first CHG shower. It is OK to shave your face.  Please follow these instructions carefully.   1. Shower the NIGHT BEFORE SURGERY and the MORNING OF SURGERY with CHG.   2. If you chose to wash your hair, wash your hair first as usual with your normal shampoo.  3. After you shampoo, rinse your hair and body thoroughly to remove the shampoo.  4. Use CHG as you would any other liquid soap. You can apply CHG directly to the skin and wash gently with a scrungie or a clean washcloth.   5. Apply the CHG Soap to your body ONLY FROM THE NECK DOWN.  Do not use on open wounds or open sores. Avoid contact with your eyes, ears, mouth and genitals (private parts). Wash Face and genitals (private parts)  with your normal soap.  6. Wash thoroughly, paying special attention to the area where your surgery will be performed.  7. Thoroughly rinse your body with  warm water from the neck down.  8. DO NOT shower/wash with your normal soap after using and rinsing off the CHG Soap.  9. Pat yourself dry with a CLEAN TOWEL.  10. Wear CLEAN PAJAMAS to bed the night before surgery, wear comfortable clothes the morning of surgery  11. Place CLEAN SHEETS on your bed the night of your first shower and DO NOT SLEEP WITH PETS.    Day of Surgery:  Do not apply any deodorants/lotions.  Please wear clean clothes to the hospital/surgery center.   Remember to brush your teeth WITH YOUR REGULAR TOOTHPASTE.    Please  read over the following fact sheets that you were given.

## 2018-04-06 NOTE — Progress Notes (Signed)
PCP - Nita Sells MD  Chest x-ray - 05/04/17 EKG - 05/04/17  Blood Thinner Instructions: N/A Aspirin Instructions: stopped today 04/06/2018  Anesthesia review: none  Patient denies shortness of breath, fever, cough and chest pain at PAT appointment   Patient verbalized understanding of instructions that were given to them at the PAT appointment. Patient was also instructed that they will need to review over the PAT instructions again at home before surgery.

## 2018-04-07 ENCOUNTER — Other Ambulatory Visit (INDEPENDENT_AMBULATORY_CARE_PROVIDER_SITE_OTHER): Payer: Self-pay | Admitting: Radiology

## 2018-04-07 MED ORDER — CIPROFLOXACIN HCL 500 MG PO TABS: 500.0000 mg | ORAL_TABLET | Freq: Two times a day (BID) | ORAL | 0 refills | Status: DC

## 2018-04-07 NOTE — Anesthesia Preprocedure Evaluation (Addendum)
Anesthesia Evaluation  Patient identified by MRN, date of birth, ID band Patient awake    Reviewed: Allergy & Precautions, NPO status , Patient's Chart, lab work & pertinent test results  Airway Mallampati: II  TM Distance: >3 FB Neck ROM: Full    Dental  (+) Upper Dentures, Partial Lower, Dental Advisory Given   Pulmonary neg pulmonary ROS,    breath sounds clear to auscultation       Cardiovascular hypertension, Pt. on medications  Rhythm:Regular Rate:Normal     Neuro/Psych  Headaches, Anxiety Depression    GI/Hepatic Neg liver ROS, GERD  ,  Endo/Other  Hypothyroidism   Renal/GU negative Renal ROS     Musculoskeletal  (+) Arthritis , Osteoarthritis,  Fibromyalgia -  Abdominal (+) + obese,   Peds  Hematology negative hematology ROS (+)   Anesthesia Other Findings   Reproductive/Obstetrics                           Anesthesia Physical Anesthesia Plan  ASA: II  Anesthesia Plan: General   Post-op Pain Management:    Induction: Intravenous  PONV Risk Score and Plan: 4 or greater and Ondansetron, Dexamethasone and Treatment may vary due to age or medical condition  Airway Management Planned: Oral ETT  Additional Equipment: Arterial line  Intra-op Plan:   Post-operative Plan: Possible Post-op intubation/ventilation  Informed Consent: I have reviewed the patients History and Physical, chart, labs and discussed the procedure including the risks, benefits and alternatives for the proposed anesthesia with the patient or authorized representative who has indicated his/her understanding and acceptance.     Dental advisory given  Plan Discussed with: CRNA  Anesthesia Plan Comments: (Mildly elevated aPTT 46s. No prior comparison. Dr. Ophelia Charter aware.)       Anesthesia Quick Evaluation

## 2018-04-08 ENCOUNTER — Telehealth (INDEPENDENT_AMBULATORY_CARE_PROVIDER_SITE_OTHER): Payer: Self-pay | Admitting: Orthopaedic Surgery

## 2018-04-08 NOTE — Telephone Encounter (Signed)
Ok fine thanks.  

## 2018-04-08 NOTE — Telephone Encounter (Signed)
Please see below. Dr. Ophelia Charter aware.

## 2018-04-08 NOTE — Telephone Encounter (Signed)
Fayrene FearingJames w/anesthesia left message on voice mail that patient is scheduled for surgery on Apr 13, 2018. Her labs indicate mildly elevated PTT at 46. PTINR is normal. No problem from an anesthesia standpoint, just want to make Dr. Ophelia CharterYates aware.

## 2018-04-08 NOTE — Telephone Encounter (Signed)
fyi

## 2018-04-13 ENCOUNTER — Inpatient Hospital Stay (HOSPITAL_COMMUNITY): Payer: Medicare Other

## 2018-04-13 ENCOUNTER — Inpatient Hospital Stay (HOSPITAL_COMMUNITY): Payer: Medicare Other | Admitting: Certified Registered Nurse Anesthetist

## 2018-04-13 ENCOUNTER — Inpatient Hospital Stay (HOSPITAL_COMMUNITY): Payer: Medicare Other | Admitting: Physician Assistant

## 2018-04-13 ENCOUNTER — Encounter (HOSPITAL_COMMUNITY)
Admission: RE | Disposition: A | Discharge status: Home Health | Payer: Self-pay | Source: Home / Self Care | Attending: Orthopaedic Surgery

## 2018-04-13 ENCOUNTER — Inpatient Hospital Stay (HOSPITAL_COMMUNITY)
Admission: RE | Admit: 2018-04-13 | Discharge: 2018-04-17 | DRG: 455 | Disposition: A | Discharge status: Home Health | Payer: Medicare Other | Attending: Orthopaedic Surgery | Admitting: Orthopaedic Surgery

## 2018-04-13 ENCOUNTER — Encounter (HOSPITAL_COMMUNITY): Payer: Self-pay | Admitting: Certified Registered Nurse Anesthetist

## 2018-04-13 DIAGNOSIS — Z820 Family history of epilepsy and other diseases of the nervous system: Secondary | ICD-10-CM

## 2018-04-13 DIAGNOSIS — T402X5A Adverse effect of other opioids, initial encounter: Secondary | ICD-10-CM | POA: Diagnosis not present

## 2018-04-13 DIAGNOSIS — K760 Fatty (change of) liver, not elsewhere classified: Secondary | ICD-10-CM | POA: Diagnosis present

## 2018-04-13 DIAGNOSIS — Z79899 Other long term (current) drug therapy: Secondary | ICD-10-CM

## 2018-04-13 DIAGNOSIS — Z8249 Family history of ischemic heart disease and other diseases of the circulatory system: Secondary | ICD-10-CM | POA: Diagnosis not present

## 2018-04-13 DIAGNOSIS — Z9842 Cataract extraction status, left eye: Secondary | ICD-10-CM

## 2018-04-13 DIAGNOSIS — K219 Gastro-esophageal reflux disease without esophagitis: Secondary | ICD-10-CM | POA: Diagnosis present

## 2018-04-13 DIAGNOSIS — Y92230 Patient room in hospital as the place of occurrence of the external cause: Secondary | ICD-10-CM | POA: Diagnosis not present

## 2018-04-13 DIAGNOSIS — Z882 Allergy status to sulfonamides status: Secondary | ICD-10-CM

## 2018-04-13 DIAGNOSIS — Z419 Encounter for procedure for purposes other than remedying health state, unspecified: Secondary | ICD-10-CM

## 2018-04-13 DIAGNOSIS — E039 Hypothyroidism, unspecified: Secondary | ICD-10-CM | POA: Diagnosis present

## 2018-04-13 DIAGNOSIS — M797 Fibromyalgia: Secondary | ICD-10-CM | POA: Diagnosis present

## 2018-04-13 DIAGNOSIS — Z90710 Acquired absence of both cervix and uterus: Secondary | ICD-10-CM | POA: Diagnosis not present

## 2018-04-13 DIAGNOSIS — I10 Essential (primary) hypertension: Secondary | ICD-10-CM | POA: Diagnosis present

## 2018-04-13 DIAGNOSIS — M5116 Intervertebral disc disorders with radiculopathy, lumbar region: Secondary | ICD-10-CM | POA: Diagnosis present

## 2018-04-13 DIAGNOSIS — Z8049 Family history of malignant neoplasm of other genital organs: Secondary | ICD-10-CM

## 2018-04-13 DIAGNOSIS — M48061 Spinal stenosis, lumbar region without neurogenic claudication: Principal | ICD-10-CM | POA: Diagnosis present

## 2018-04-13 DIAGNOSIS — M4316 Spondylolisthesis, lumbar region: Secondary | ICD-10-CM | POA: Diagnosis present

## 2018-04-13 DIAGNOSIS — Z90722 Acquired absence of ovaries, bilateral: Secondary | ICD-10-CM | POA: Diagnosis not present

## 2018-04-13 DIAGNOSIS — W19XXXA Unspecified fall, initial encounter: Secondary | ICD-10-CM

## 2018-04-13 DIAGNOSIS — L299 Pruritus, unspecified: Secondary | ICD-10-CM | POA: Diagnosis not present

## 2018-04-13 DIAGNOSIS — F329 Major depressive disorder, single episode, unspecified: Secondary | ICD-10-CM | POA: Diagnosis present

## 2018-04-13 DIAGNOSIS — Z7982 Long term (current) use of aspirin: Secondary | ICD-10-CM

## 2018-04-13 DIAGNOSIS — F419 Anxiety disorder, unspecified: Secondary | ICD-10-CM | POA: Diagnosis present

## 2018-04-13 DIAGNOSIS — Z791 Long term (current) use of non-steroidal anti-inflammatories (NSAID): Secondary | ICD-10-CM

## 2018-04-13 DIAGNOSIS — Z9049 Acquired absence of other specified parts of digestive tract: Secondary | ICD-10-CM

## 2018-04-13 DIAGNOSIS — Z8379 Family history of other diseases of the digestive system: Secondary | ICD-10-CM

## 2018-04-13 DIAGNOSIS — R7303 Prediabetes: Secondary | ICD-10-CM | POA: Diagnosis present

## 2018-04-13 DIAGNOSIS — Z8042 Family history of malignant neoplasm of prostate: Secondary | ICD-10-CM | POA: Diagnosis not present

## 2018-04-13 DIAGNOSIS — Z801 Family history of malignant neoplasm of trachea, bronchus and lung: Secondary | ICD-10-CM

## 2018-04-13 DIAGNOSIS — Z79890 Hormone replacement therapy (postmenopausal): Secondary | ICD-10-CM

## 2018-04-13 DIAGNOSIS — Z9841 Cataract extraction status, right eye: Secondary | ICD-10-CM | POA: Diagnosis not present

## 2018-04-13 DIAGNOSIS — Z808 Family history of malignant neoplasm of other organs or systems: Secondary | ICD-10-CM | POA: Diagnosis not present

## 2018-04-13 DIAGNOSIS — Z01818 Encounter for other preprocedural examination: Secondary | ICD-10-CM

## 2018-04-13 DIAGNOSIS — M532X7 Spinal instabilities, lumbosacral region: Secondary | ICD-10-CM | POA: Diagnosis not present

## 2018-04-13 DIAGNOSIS — Z91048 Other nonmedicinal substance allergy status: Secondary | ICD-10-CM

## 2018-04-13 LAB — GLUCOSE, CAPILLARY: Glucose-Capillary: 108 mg/dL — ABNORMAL HIGH (ref 70–99)

## 2018-04-13 SURGERY — POSTERIOR LUMBAR FUSION 2 LEVEL
Anesthesia: General | Site: Spine Lumbar

## 2018-04-13 MED ORDER — MIDAZOLAM HCL 2 MG/2ML IJ SOLN
INTRAMUSCULAR | Status: AC
  Filled 2018-04-13: qty 2

## 2018-04-13 MED ORDER — POLYETHYLENE GLYCOL 3350 17 G PO PACK: 17.0000 g | PACK | Freq: Every day | ORAL | Status: DC | PRN

## 2018-04-13 MED ORDER — BUPIVACAINE HCL (PF) 0.5 % IJ SOLN
INTRAMUSCULAR | Status: AC
  Filled 2018-04-13: qty 30

## 2018-04-13 MED ORDER — CEFAZOLIN SODIUM-DEXTROSE 2-4 GM/100ML-% IV SOLN
INTRAVENOUS | Status: AC
  Filled 2018-04-13: qty 100

## 2018-04-13 MED ORDER — MIDAZOLAM HCL 2 MG/2ML IJ SOLN
INTRAMUSCULAR | Status: DC | PRN
  Administered 2018-04-13: 1 mg via INTRAVENOUS

## 2018-04-13 MED ORDER — SODIUM CHLORIDE 0.9% FLUSH: 3.0000 mL | INTRAVENOUS | Status: DC | PRN

## 2018-04-13 MED ORDER — PROPOFOL 10 MG/ML IV BOLUS
INTRAVENOUS | Status: DC | PRN
  Administered 2018-04-13: 130 mg via INTRAVENOUS

## 2018-04-13 MED ORDER — ROCURONIUM BROMIDE 50 MG/5ML IV SOSY
PREFILLED_SYRINGE | INTRAVENOUS | Status: AC
  Filled 2018-04-13: qty 5

## 2018-04-13 MED ORDER — LACTATED RINGERS IV SOLN
INTRAVENOUS | Status: DC
  Administered 2018-04-13 (×2): via INTRAVENOUS

## 2018-04-13 MED ORDER — PANTOPRAZOLE SODIUM 40 MG PO TBEC
40.0000 mg | DELAYED_RELEASE_TABLET | Freq: Every day | ORAL | Status: DC
  Administered 2018-04-14 – 2018-04-17 (×4): 40 mg via ORAL
  Filled 2018-04-13 (×4): qty 1

## 2018-04-13 MED ORDER — DOCUSATE SODIUM 100 MG PO CAPS
100.0000 mg | ORAL_CAPSULE | Freq: Two times a day (BID) | ORAL | Status: DC
  Administered 2018-04-13 – 2018-04-17 (×8): 100 mg via ORAL
  Filled 2018-04-13 (×8): qty 1

## 2018-04-13 MED ORDER — PREGABALIN 100 MG PO CAPS
200.0000 mg | ORAL_CAPSULE | Freq: Three times a day (TID) | ORAL | Status: DC
  Administered 2018-04-13 – 2018-04-15 (×5): 200 mg via ORAL
  Filled 2018-04-13 (×5): qty 2

## 2018-04-13 MED ORDER — ALBUMIN HUMAN 5 % IV SOLN
INTRAVENOUS | Status: DC | PRN
  Administered 2018-04-13 (×2): via INTRAVENOUS

## 2018-04-13 MED ORDER — LEVOTHYROXINE SODIUM 100 MCG PO TABS
100.0000 ug | ORAL_TABLET | Freq: Every day | ORAL | Status: DC
  Administered 2018-04-14 – 2018-04-17 (×4): 100 ug via ORAL
  Filled 2018-04-13 (×4): qty 1

## 2018-04-13 MED ORDER — SUGAMMADEX SODIUM 200 MG/2ML IV SOLN
INTRAVENOUS | Status: DC | PRN
  Administered 2018-04-13 (×2): 155.4 mg via INTRAVENOUS

## 2018-04-13 MED ORDER — FENTANYL CITRATE (PF) 250 MCG/5ML IJ SOLN
INTRAMUSCULAR | Status: AC
  Filled 2018-04-13: qty 5

## 2018-04-13 MED ORDER — SODIUM CHLORIDE 0.9% FLUSH
3.0000 mL | Freq: Two times a day (BID) | INTRAVENOUS | Status: DC
  Administered 2018-04-14 – 2018-04-17 (×6): 3 mL via INTRAVENOUS

## 2018-04-13 MED ORDER — ROCURONIUM BROMIDE 10 MG/ML (PF) SYRINGE
PREFILLED_SYRINGE | INTRAVENOUS | Status: DC | PRN
  Administered 2018-04-13: 20 mg via INTRAVENOUS
  Administered 2018-04-13 (×2): 50 mg via INTRAVENOUS

## 2018-04-13 MED ORDER — ACETAMINOPHEN 10 MG/ML IV SOLN: 1000.0000 mg | Freq: Once | INTRAVENOUS | Status: DC | PRN

## 2018-04-13 MED ORDER — LIDOCAINE 2% (20 MG/ML) 5 ML SYRINGE
INTRAMUSCULAR | Status: DC | PRN
  Administered 2018-04-13: 40 mg via INTRAVENOUS

## 2018-04-13 MED ORDER — ONDANSETRON HCL 4 MG/2ML IJ SOLN
INTRAMUSCULAR | Status: AC
  Filled 2018-04-13: qty 2

## 2018-04-13 MED ORDER — GLYCOPYRROLATE PF 0.2 MG/ML IJ SOSY
PREFILLED_SYRINGE | INTRAMUSCULAR | Status: DC | PRN
  Administered 2018-04-13: .2 mg via INTRAVENOUS

## 2018-04-13 MED ORDER — LACTATED RINGERS IV SOLN: INTRAVENOUS | Status: DC

## 2018-04-13 MED ORDER — BUPIVACAINE HCL (PF) 0.5 % IJ SOLN
INTRAMUSCULAR | Status: DC | PRN
  Administered 2018-04-13: 18 mL

## 2018-04-13 MED ORDER — ACETAMINOPHEN 325 MG PO TABS
650.0000 mg | ORAL_TABLET | ORAL | Status: DC | PRN
  Administered 2018-04-14 – 2018-04-16 (×3): 650 mg via ORAL
  Filled 2018-04-13 (×3): qty 2

## 2018-04-13 MED ORDER — HYDROMORPHONE HCL 1 MG/ML IJ SOLN
0.5000 mg | INTRAMUSCULAR | Status: DC | PRN
  Administered 2018-04-13 – 2018-04-15 (×6): 0.5 mg via INTRAVENOUS
  Filled 2018-04-13 (×6): qty 1

## 2018-04-13 MED ORDER — SODIUM CHLORIDE (PF) 0.9 % IJ SOLN
INTRAMUSCULAR | Status: AC
  Filled 2018-04-13: qty 10

## 2018-04-13 MED ORDER — PROMETHAZINE HCL 25 MG/ML IJ SOLN: 6.2500 mg | INTRAMUSCULAR | Status: DC | PRN

## 2018-04-13 MED ORDER — FENOFIBRATE 160 MG PO TABS
160.0000 mg | ORAL_TABLET | Freq: Every day | ORAL | Status: DC
  Administered 2018-04-14 – 2018-04-17 (×4): 160 mg via ORAL
  Filled 2018-04-13 (×4): qty 1

## 2018-04-13 MED ORDER — GLYCOPYRROLATE PF 0.2 MG/ML IJ SOSY
PREFILLED_SYRINGE | INTRAMUSCULAR | Status: AC
  Filled 2018-04-13: qty 1

## 2018-04-13 MED ORDER — LIDOCAINE IN D5W 4-5 MG/ML-% IV SOLN
1.0000 mg/min | INTRAVENOUS | Status: AC
  Administered 2018-04-13: 25 ug/kg/min via INTRAVENOUS
  Filled 2018-04-13: qty 500

## 2018-04-13 MED ORDER — PHENOL 1.4 % MT LIQD: 1.0000 | OROMUCOSAL | Status: DC | PRN

## 2018-04-13 MED ORDER — MEPERIDINE HCL 50 MG/ML IJ SOLN: 6.2500 mg | INTRAMUSCULAR | Status: DC | PRN

## 2018-04-13 MED ORDER — ACETAMINOPHEN 160 MG/5ML PO SOLN: 325.0000 mg | Freq: Once | ORAL | Status: DC

## 2018-04-13 MED ORDER — PROPOFOL 500 MG/50ML IV EMUL
INTRAVENOUS | Status: DC | PRN
  Administered 2018-04-13: 50 ug/kg/min via INTRAVENOUS
  Administered 2018-04-13: 16:00:00 via INTRAVENOUS

## 2018-04-13 MED ORDER — FENTANYL CITRATE (PF) 250 MCG/5ML IJ SOLN
INTRAMUSCULAR | Status: DC | PRN
  Administered 2018-04-13 (×2): 50 ug via INTRAVENOUS
  Administered 2018-04-13: 100 ug via INTRAVENOUS

## 2018-04-13 MED ORDER — SODIUM CHLORIDE 0.9 % IV SOLN
INTRAVENOUS | Status: DC
  Administered 2018-04-13: 23:00:00 via INTRAVENOUS

## 2018-04-13 MED ORDER — DEXAMETHASONE SODIUM PHOSPHATE 10 MG/ML IJ SOLN
INTRAMUSCULAR | Status: AC
  Filled 2018-04-13: qty 1

## 2018-04-13 MED ORDER — CEFAZOLIN SODIUM-DEXTROSE 2-4 GM/100ML-% IV SOLN
2.0000 g | INTRAVENOUS | Status: AC
  Administered 2018-04-13 (×2): 2 g via INTRAVENOUS

## 2018-04-13 MED ORDER — PROPOFOL 1000 MG/100ML IV EMUL
INTRAVENOUS | Status: AC
  Filled 2018-04-13: qty 100

## 2018-04-13 MED ORDER — HYDROMORPHONE HCL 1 MG/ML IJ SOLN
INTRAMUSCULAR | Status: AC
  Administered 2018-04-13: 0.5 mg via INTRAVENOUS
  Filled 2018-04-13: qty 1

## 2018-04-13 MED ORDER — ACETAMINOPHEN 325 MG PO TABS: 325.0000 mg | ORAL_TABLET | Freq: Once | ORAL | Status: DC

## 2018-04-13 MED ORDER — ARTIFICIAL TEARS OPHTHALMIC OINT
TOPICAL_OINTMENT | OPHTHALMIC | Status: AC
  Filled 2018-04-13: qty 3.5

## 2018-04-13 MED ORDER — DULOXETINE HCL 60 MG PO CPEP
60.0000 mg | ORAL_CAPSULE | Freq: Every day | ORAL | Status: DC
  Administered 2018-04-14 – 2018-04-17 (×4): 60 mg via ORAL
  Filled 2018-04-13 (×4): qty 1

## 2018-04-13 MED ORDER — CEFAZOLIN SODIUM 1 G IJ SOLR
INTRAMUSCULAR | Status: AC
  Filled 2018-04-13: qty 20

## 2018-04-13 MED ORDER — HYDROMORPHONE HCL 1 MG/ML IJ SOLN
0.2500 mg | INTRAMUSCULAR | Status: DC | PRN
  Administered 2018-04-13 (×2): 0.5 mg via INTRAVENOUS

## 2018-04-13 MED ORDER — ROCURONIUM BROMIDE 50 MG/5ML IV SOSY
PREFILLED_SYRINGE | INTRAVENOUS | Status: AC
  Filled 2018-04-13: qty 10

## 2018-04-13 MED ORDER — ONDANSETRON HCL 4 MG/2ML IJ SOLN: 4.0000 mg | Freq: Four times a day (QID) | INTRAMUSCULAR | Status: DC | PRN

## 2018-04-13 MED ORDER — KETAMINE HCL 10 MG/ML IJ SOLN
INTRAMUSCULAR | Status: DC | PRN
  Administered 2018-04-13 (×2): 10 mg via INTRAVENOUS
  Administered 2018-04-13: 20 mg via INTRAVENOUS
  Administered 2018-04-13: 10 mg via INTRAVENOUS

## 2018-04-13 MED ORDER — SODIUM CHLORIDE 0.9 % IV SOLN
INTRAVENOUS | Status: DC | PRN
  Administered 2018-04-13: 25 ug/min via INTRAVENOUS

## 2018-04-13 MED ORDER — DEXAMETHASONE SODIUM PHOSPHATE 10 MG/ML IJ SOLN
INTRAMUSCULAR | Status: DC | PRN
  Administered 2018-04-13: 10 mg via INTRAVENOUS

## 2018-04-13 MED ORDER — ACETAMINOPHEN 10 MG/ML IV SOLN
INTRAVENOUS | Status: AC
  Filled 2018-04-13: qty 100

## 2018-04-13 MED ORDER — CHLORHEXIDINE GLUCONATE 4 % EX LIQD: 60.0000 mL | Freq: Once | CUTANEOUS | Status: DC

## 2018-04-13 MED ORDER — PROPOFOL 10 MG/ML IV BOLUS
INTRAVENOUS | Status: AC
  Filled 2018-04-13: qty 20

## 2018-04-13 MED ORDER — KETAMINE HCL 50 MG/5ML IJ SOSY
PREFILLED_SYRINGE | INTRAMUSCULAR | Status: AC
  Filled 2018-04-13: qty 5

## 2018-04-13 MED ORDER — ACETAMINOPHEN 650 MG RE SUPP: 650.0000 mg | RECTAL | Status: DC | PRN

## 2018-04-13 MED ORDER — SODIUM CHLORIDE 0.9 % IV SOLN: 250.0000 mL | INTRAVENOUS | Status: DC

## 2018-04-13 MED ORDER — CEFAZOLIN SODIUM-DEXTROSE 1-4 GM/50ML-% IV SOLN
1.0000 g | Freq: Three times a day (TID) | INTRAVENOUS | Status: AC
  Administered 2018-04-13 – 2018-04-14 (×2): 1 g via INTRAVENOUS
  Filled 2018-04-13 (×2): qty 50

## 2018-04-13 MED ORDER — 0.9 % SODIUM CHLORIDE (POUR BTL) OPTIME
TOPICAL | Status: DC | PRN
  Administered 2018-04-13: 1000 mL

## 2018-04-13 MED ORDER — ONDANSETRON HCL 4 MG PO TABS: 4.0000 mg | ORAL_TABLET | Freq: Four times a day (QID) | ORAL | Status: DC | PRN

## 2018-04-13 MED ORDER — LACTATED RINGERS IV SOLN
INTRAVENOUS | Status: DC | PRN
  Administered 2018-04-13: 13:00:00 via INTRAVENOUS

## 2018-04-13 MED ORDER — THROMBIN 20000 UNITS EX SOLR
CUTANEOUS | Status: AC
  Filled 2018-04-13: qty 20000

## 2018-04-13 MED ORDER — LIDOCAINE 2% (20 MG/ML) 5 ML SYRINGE
INTRAMUSCULAR | Status: AC
  Filled 2018-04-13: qty 5

## 2018-04-13 MED ORDER — MELATONIN 3 MG PO TABS
9.0000 mg | ORAL_TABLET | Freq: Every day | ORAL | Status: DC
  Administered 2018-04-13 – 2018-04-16 (×4): 9 mg via ORAL
  Filled 2018-04-13 (×4): qty 3

## 2018-04-13 MED ORDER — MENTHOL 3 MG MT LOZG: 1.0000 | LOZENGE | OROMUCOSAL | Status: DC | PRN

## 2018-04-13 MED ORDER — ONDANSETRON HCL 4 MG/2ML IJ SOLN
INTRAMUSCULAR | Status: DC | PRN
  Administered 2018-04-13: 4 mg via INTRAVENOUS

## 2018-04-13 MED ORDER — HYDROCHLOROTHIAZIDE 12.5 MG PO CAPS
12.5000 mg | ORAL_CAPSULE | Freq: Every day | ORAL | Status: DC
  Administered 2018-04-14 – 2018-04-17 (×4): 12.5 mg via ORAL
  Filled 2018-04-13 (×4): qty 1

## 2018-04-13 MED ORDER — OXYCODONE HCL 5 MG PO TABS
5.0000 mg | ORAL_TABLET | ORAL | Status: DC | PRN
  Administered 2018-04-15: 5 mg via ORAL
  Filled 2018-04-13 (×2): qty 1

## 2018-04-13 SURGICAL SUPPLY — 77 items
BANDAGE ACE 6X5 VEL STRL LF (GAUZE/BANDAGES/DRESSINGS) IMPLANT
BLADE CLIPPER SURG (BLADE) IMPLANT
BLADE SURG 10 STRL SS (BLADE) ×2 IMPLANT
BUR RND DIAMOND ELITE 4.0 (BURR) ×2 IMPLANT
BUR ROUND FLUTED 4 SOFT TCH (BURR) IMPLANT
CABLE BIPOLOR RESECTION CORD (MISCELLANEOUS) ×2 IMPLANT
CAGE TPAL 9X10X28 (Cage) ×2 IMPLANT
CAP SPINAL LOCKING TI (Cap) ×16 IMPLANT
COVER BACK TABLE 80X110 HD (DRAPES) ×2 IMPLANT
COVER SURGICAL LIGHT HANDLE (MISCELLANEOUS) ×2 IMPLANT
COVER WAND RF STERILE (DRAPES) ×2 IMPLANT
DERMABOND ADVANCED (GAUZE/BANDAGES/DRESSINGS) ×1
DERMABOND ADVANCED .7 DNX12 (GAUZE/BANDAGES/DRESSINGS) ×1 IMPLANT
DRAPE C-ARM 42X72 X-RAY (DRAPES) ×2 IMPLANT
DRAPE HALF SHEET 40X57 (DRAPES) ×8 IMPLANT
DRAPE LAPAROTOMY T 102X78X121 (DRAPES) ×2 IMPLANT
DRAPE MICROSCOPE LEICA (MISCELLANEOUS) ×2 IMPLANT
DRAPE ORTHO SPLIT 77X108 STRL (DRAPES) ×1
DRAPE SURG 17X23 STRL (DRAPES) ×6 IMPLANT
DRAPE SURG ORHT 6 SPLT 77X108 (DRAPES) ×1 IMPLANT
DRSG EMULSION OIL 3X3 NADH (GAUZE/BANDAGES/DRESSINGS) ×2 IMPLANT
DRSG MEPILEX BORDER 4X4 (GAUZE/BANDAGES/DRESSINGS) ×2 IMPLANT
DRSG MEPILEX BORDER 4X8 (GAUZE/BANDAGES/DRESSINGS) ×2 IMPLANT
DRSG PAD ABDOMINAL 8X10 ST (GAUZE/BANDAGES/DRESSINGS) ×4 IMPLANT
DURAPREP 26ML APPLICATOR (WOUND CARE) ×2 IMPLANT
ELECT BLADE 4.0 EZ CLEAN MEGAD (MISCELLANEOUS) ×2
ELECT CAUTERY BLADE 6.4 (BLADE) ×2 IMPLANT
ELECT REM PT RETURN 9FT ADLT (ELECTROSURGICAL) ×2
ELECTRODE BLDE 4.0 EZ CLN MEGD (MISCELLANEOUS) ×1 IMPLANT
ELECTRODE REM PT RTRN 9FT ADLT (ELECTROSURGICAL) ×1 IMPLANT
EVACUATOR 1/8 PVC DRAIN (DRAIN) ×2 IMPLANT
GAUZE SPONGE 4X4 12PLY STRL (GAUZE/BANDAGES/DRESSINGS) ×2 IMPLANT
GLOVE BIOGEL PI IND STRL 8 (GLOVE) ×2 IMPLANT
GLOVE BIOGEL PI INDICATOR 8 (GLOVE) ×2
GLOVE ORTHO TXT STRL SZ7.5 (GLOVE) ×4 IMPLANT
GOWN STRL REUS W/ TWL LRG LVL3 (GOWN DISPOSABLE) ×1 IMPLANT
GOWN STRL REUS W/ TWL XL LVL3 (GOWN DISPOSABLE) ×1 IMPLANT
GOWN STRL REUS W/TWL 2XL LVL3 (GOWN DISPOSABLE) ×2 IMPLANT
GOWN STRL REUS W/TWL LRG LVL3 (GOWN DISPOSABLE) ×1
GOWN STRL REUS W/TWL XL LVL3 (GOWN DISPOSABLE) ×1
HEMOSTAT SURGICEL 2X14 (HEMOSTASIS) IMPLANT
KIT BASIN OR (CUSTOM PROCEDURE TRAY) ×2 IMPLANT
KIT TURNOVER KIT B (KITS) ×2 IMPLANT
MANIFOLD NEPTUNE II (INSTRUMENTS) ×2 IMPLANT
MATRIX STARDRIVE (MISCELLANEOUS) ×6 IMPLANT
NEEDLE SPNL 18GX3.5 QUINCKE PK (NEEDLE) ×6 IMPLANT
NS IRRIG 1000ML POUR BTL (IV SOLUTION) ×2 IMPLANT
PACK LAMINECTOMY ORTHO (CUSTOM PROCEDURE TRAY) ×2 IMPLANT
PAD ABD 8X10 STRL (GAUZE/BANDAGES/DRESSINGS) ×2 IMPLANT
PAD ARMBOARD 7.5X6 YLW CONV (MISCELLANEOUS) ×4 IMPLANT
PATTIES SURGICAL .5 X.5 (GAUZE/BANDAGES/DRESSINGS) ×4 IMPLANT
PATTIES SURGICAL .75X.75 (GAUZE/BANDAGES/DRESSINGS) IMPLANT
ROD DEGEN MATRIX 60MM LUMBAR (Rod) ×4 IMPLANT
SCREW CANN MATRIX POLY 5.0X35 (Screw) ×4 IMPLANT
SCREW MATRIX MIS 6.0X35MM (Screw) ×4 IMPLANT
SCREW MATRIX MIS 6.0X40MM (Screw) ×2 IMPLANT
SCREW POLY MATRIX MIS (Screw) ×2 IMPLANT
SPACER TPAL 10X8X28MM (Spacer) ×2 IMPLANT
SPONGE LAP 18X18 X RAY DECT (DISPOSABLE) IMPLANT
SPONGE LAP 4X18 RFD (DISPOSABLE) ×8 IMPLANT
SPONGE SURGIFOAM ABS GEL 100 (HEMOSTASIS) IMPLANT
STAPLER VISISTAT 35W (STAPLE) IMPLANT
SUT BONE WAX W31G (SUTURE) ×2 IMPLANT
SUT VIC AB 1 CTX 36 (SUTURE)
SUT VIC AB 1 CTX36XBRD ANBCTR (SUTURE) IMPLANT
SUT VIC AB 2-0 CT1 27 (SUTURE) ×1
SUT VIC AB 2-0 CT1 TAPERPNT 27 (SUTURE) ×1 IMPLANT
SUT VIC AB 3-0 X1 27 (SUTURE) ×2 IMPLANT
TAP SURGICAL HEX 5.0X180 (Miscellaneous) ×2 IMPLANT
TAP SURGICAL SPINAL HEX 4X180 (TAP) ×2 IMPLANT
TAPE CLOTH SURG 6X10 WHT LF (GAUZE/BANDAGES/DRESSINGS) ×2 IMPLANT
TOWEL GREEN STERILE (TOWEL DISPOSABLE) ×2 IMPLANT
TOWEL GREEN STERILE FF (TOWEL DISPOSABLE) ×2 IMPLANT
TRAY FOLEY MTR SLVR 16FR STAT (SET/KITS/TRAYS/PACK) ×2 IMPLANT
WATER STERILE IRR 1000ML POUR (IV SOLUTION) ×2 IMPLANT
YANKAUER SUCT BULB TIP NO VENT (SUCTIONS) ×2 IMPLANT
matrix tap 4mm ×2 IMPLANT

## 2018-04-13 NOTE — Interval H&P Note (Signed)
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 After consideration of risks, benefits and other options for treatment, the patient has consented to  Procedure(s): RIGHT L3-4, L4-5 TRANSFORAMINAL LUMBAR INTERBODY FUSION, REDO COMPRESSION, CAGES, PEDICLE INSTRUMENTATION (N/A) as a surgical intervention .  The patient's history has been reviewed, patient examined, no change in status, stable for surgery.  I have reviewed the patient's chart and labs.  Questions were answered to the patient's satisfaction.     Eldred MangesMark C Assata Juncaj

## 2018-04-13 NOTE — Anesthesia Procedure Notes (Signed)
Arterial Line Insertion Start/End1/22/2020 12:00 PM Performed by: Mayer Camel, CRNA, CRNA  Preanesthetic checklist: patient identified, IV checked, site marked, risks and benefits discussed, surgical consent, monitors and equipment checked, pre-op evaluation, timeout performed and anesthesia consent Lidocaine 1% used for infiltration Right, radial was placed Catheter size: 20 G Hand hygiene performed  and maximum sterile barriers used   Attempts: 1 Procedure performed without using ultrasound guided technique. Ultrasound Notes:anatomy identified Following insertion, Biopatch and dressing applied. Post procedure assessment: normal  Patient tolerated the procedure well with no immediate complications.

## 2018-04-13 NOTE — Transfer of Care (Signed)
Immediate Anesthesia Transfer of Care Note  Patient: [name]  Procedure(s) Performed: RIGHT L3-4, L4-5 TRANSFORAMINAL LUMBAR INTERBODY FUSION, REDO COMPRESSION, CAGES, PEDICLE INSTRUMENTATION (N/A Spine Lumbar)  Patient Location: PACU  Anesthesia Type:General  Level of Consciousness: awake, alert  and oriented  Airway & Oxygen Therapy: Patient Spontanous Breathing and Patient connected to face mask oxygen  Post-op Assessment: Report given to RN and Post -op Vital signs reviewed and stable  Post vital signs: Reviewed and stable  Last Vitals:  Vitals Value Taken Time  BP 128/71 04/13/2018  5:58 PM  Temp    Pulse 81 04/13/2018  6:02 PM  Resp 12 04/13/2018  6:02 PM  SpO2 100 % 04/13/2018  6:02 PM  Vitals shown include unvalidated device data.  Last Pain:  Vitals:   04/13/18 1113  TempSrc:   PainSc: 3       Patients Stated Pain Goal: 4 (04/13/18 1113)  Complications: No apparent anesthesia complications

## 2018-04-13 NOTE — Anesthesia Procedure Notes (Signed)
Procedure Name: Intubation Date/Time: 04/13/2018 12:57 PM Performed by: Mayer Camel, CRNA Pre-anesthesia Checklist: Patient identified, Emergency Drugs available, Suction available and Patient being monitored Patient Re-evaluated:Patient Re-evaluated prior to induction Oxygen Delivery Method: Circle System Utilized Preoxygenation: Pre-oxygenation with 100% oxygen Induction Type: IV induction Ventilation: Mask ventilation without difficulty Laryngoscope Size: Miller and 2 Grade View: Grade I Tube type: Oral Tube size: 7.0 mm Number of attempts: 1 Airway Equipment and Method: Stylet and Oral airway Placement Confirmation: ETT inserted through vocal cords under direct vision,  positive ETCO2 and breath sounds checked- equal and bilateral Secured at: 21 cm Tube secured with: Tape Dental Injury: Teeth and Oropharynx as per pre-operative assessment

## 2018-04-13 NOTE — H&P (Signed)
[name] is an 78 y.o. female.   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  Progressively worsening symptoms.  Failed conservative treatment.  Past Medical History:  Diagnosis Date  . Anxiety   . Arthritis    hands, back   . Chronic back pain   . Depression   . Diverticulosis   . Fatty liver   . Fibromyalgia   . GERD (gastroesophageal reflux disease) 2006   EGD by Dr. Lovell Sheehan, reports that she has had reflux quite markedly in the past but its improved since taking medicine   . Headache    use to have migraines when she was younger    . Hemorrhage after delivery of fetus   . Hyperglycemia   . Hypertension    reports that she takes for diuretic   . Hypothyroid   . Pre-diabetes   . S/P colonoscopy 2006   Dr. Lovell Sheehan    Past Surgical History:  Procedure Laterality Date  . APPENDECTOMY    . BREAST BIOPSY Right   . cataracts Bilateral   . CHOLECYSTECTOMY  1998  . COLONOSCOPY WITH PROPOFOL N/A 04/26/2014   Procedure: COLONOSCOPY WITH PROPOFOL;  Surgeon: Corbin Ade, MD;  Location: [facility];  Service: Endoscopy;  Laterality: N/A;  0939 in cecum, total withdrawal time, 10 min  . LUMBAR DISC SURGERY  1980's  . LUMBAR LAMINECTOMY/DECOMPRESSION MICRODISCECTOMY N/A 05/10/2017   Procedure: L3-4 DECOMPRESSION, RIGHT MICRODISCECTOMY;  Surgeon: [name], MD;  Location: [facility] OR;  Service: Orthopedics;  Laterality: N/A;  . TOTAL ABDOMINAL HYSTERECTOMY W/ BILATERAL SALPINGOOPHORECTOMY      Family History  Problem Relation Age of Onset  . Brain cancer Father   . Coronary artery disease Mother   . Prostate cancer Brother   . Cancer Sister        gyn  . Lung cancer Brother   . Seizures Son   . Pancreatitis Daughter        ?gallstones  . Colon cancer Neg Hx    Social History:  reports that she has never smoked. She has never used  smokeless tobacco. She reports that she does not drink alcohol or use drugs.  Allergies:  Allergies  Allergen Reactions  . Sulfonamide Derivatives Shortness Of Breath and Rash    Rash on chest  . Adhesive [Tape]     Creates skin tears    Medications Prior to Admission  Medication Sig Dispense Refill  . acetaminophen (TYLENOL) 325 MG tablet Take 650 mg by mouth every 6 (six) hours as needed for moderate pain.    Marland Kitchen aspirin 81 MG tablet Take 81 mg by mouth at bedtime.     . ciprofloxacin (CIPRO) 500 MG tablet Take 1 tablet (500 mg total) by mouth 2 (two) times daily. 14 tablet 0  . diphenhydramine-acetaminophen (TYLENOL PM) 25-500 MG TABS tablet Take 1 tablet by mouth at bedtime.    . DULoxetine (CYMBALTA) 60 MG capsule Take 60 mg by mouth daily before breakfast.     . fenofibrate (TRICOR) 145 MG tablet Take 145 mg by mouth daily.    . hydrochlorothiazide (MICROZIDE) 12.5 MG capsule Take 12.5 mg by mouth daily.     Marland Kitchen levothyroxine (SYNTHROID, LEVOTHROID) 100 MCG tablet Take 100 mcg by mouth daily before breakfast.    . Lidocaine 4 % PTCH Apply 1 patch topically daily as needed (pain).    Marland Kitchen  Melatonin 10 MG TABS Take 10 mg by mouth at bedtime.    . meloxicam (MOBIC) 15 MG tablet Take 15 mg by mouth daily.     . Multiple Vitamin (MULTIVITAMIN) capsule Take 1 capsule by mouth daily.      Marland Kitchen omeprazole (PRILOSEC) 20 MG capsule Take 20 mg by mouth daily.     . pregabalin (LYRICA) 200 MG capsule Take 200 mg by mouth 3 (three) times daily.     . diphenhydrAMINE (BENADRYL) 25 MG tablet Take 25 mg by mouth daily as needed for allergies.    Marland Kitchen HYDROcodone-acetaminophen (NORCO) 7.5-325 MG tablet Take 1 tablet by mouth every 4 (four) hours as needed for moderate pain ((score 4 to 6)). (Patient not taking: Reported on 06/03/2017) 40 tablet 0    Results for orders placed or performed during the hospital encounter of 04/13/18 (from the past 48 hour(s))  Glucose, capillary     Status: Abnormal   Collection  Time: 04/13/18 10:43 AM  Result Value Ref Range   Glucose-Capillary 108 (H) 70 - 99 mg/dL   Comment 1 Notify RN    Comment 2 Document in Chart    Dg Chest 2 View  Result Date: 04/13/2018 CLINICAL DATA:  Preoperative evaluation for back surgery, history GERD, hypertension EXAM: CHEST - 2 VIEW COMPARISON:  05/04/2017 FINDINGS: Normal heart size, mediastinal contours, and pulmonary vascularity. Atherosclerotic calcification aorta. Lungs clear. No infiltrate, pleural effusion or pneumothorax. Bones unremarkable. IMPRESSION: No acute abnormalities. Electronically Signed   By: Ulyses Southward M.D.   On: 04/13/2018 11:02    Review of Systems  Constitutional: Negative.   HENT: Negative.   Respiratory: Negative.   Cardiovascular: Negative.   Musculoskeletal: Positive for back pain.  Skin: Negative.   Neurological: Positive for tingling.  Psychiatric/Behavioral: Negative.     Blood pressure (!) 162/65, pulse 82, temperature 97.7 F (36.5 C), temperature source Oral, resp. rate 20, height 5\' 2"  (1.575 m), weight 77.7 kg, SpO2 96 %. Physical Exam  Constitutional: She is oriented to person, place, and time. No distress.  HENT:  Head: Normocephalic and atraumatic.  Eyes: Pupils are equal, round, and reactive to light.  Neck: Normal range of motion.  Cardiovascular: Normal heart sounds.  Respiratory: No respiratory distress.  GI: She exhibits no distension.  Musculoskeletal:        General: Tenderness present.  Neurological: She is alert and oriented to person, place, and time.  Skin: Skin is warm and dry.     Assessment/Plan Right L3-4 and L4-5 HNP/stenosis   We will proceed with RIGHT L3-4, L4-5 TRANSFORAMINAL LUMBAR INTERBODY FUSION, REDO COMPRESSION, CAGES, PEDICLE INSTRUMENTATION as scheduled.  Surgical procedure along with potential rehab/recovery time discussed.  All questions answered.  Zonia Kief, PA-C 04/13/2018, 11:50 AM

## 2018-04-14 ENCOUNTER — Other Ambulatory Visit: Payer: Self-pay

## 2018-04-14 LAB — POCT I-STAT 7, (LYTES, BLD GAS, ICA,H+H)
Acid-base deficit: 1 mmol/L (ref 0.0–2.0)
Acid-base deficit: 4 mmol/L — ABNORMAL HIGH (ref 0.0–2.0)
Acid-base deficit: 4 mmol/L — ABNORMAL HIGH (ref 0.0–2.0)
Bicarbonate: 23.9 mmol/L (ref 20.0–28.0)
Bicarbonate: 21.6 mmol/L (ref 20.0–28.0)
Bicarbonate: 22 mmol/L (ref 20.0–28.0)
Calcium, Ion: 1.2 mmol/L (ref 1.15–1.40)
Calcium, Ion: 1.15 mmol/L (ref 1.15–1.40)
Calcium, Ion: 1.17 mmol/L (ref 1.15–1.40)
HCT: 32 % — ABNORMAL LOW (ref 36.0–46.0)
HCT: 26 % — ABNORMAL LOW (ref 36.0–46.0)
HCT: 26 % — ABNORMAL LOW (ref 36.0–46.0)
Hemoglobin: 10.9 g/dL — ABNORMAL LOW (ref 12.0–15.0)
Hemoglobin: 8.8 g/dL — ABNORMAL LOW (ref 12.0–15.0)
Hemoglobin: 8.8 g/dL — ABNORMAL LOW (ref 12.0–15.0)
O2 Saturation: 99 %
O2 Saturation: 99 %
O2 Saturation: 99 %
Patient temperature: 36.1
Patient temperature: 35.5
Patient temperature: 35.6
Potassium: 4 mmol/L (ref 3.5–5.1)
Potassium: 4.1 mmol/L (ref 3.5–5.1)
Potassium: 4.2 mmol/L (ref 3.5–5.1)
Sodium: 137 mmol/L (ref 135–145)
Sodium: 137 mmol/L (ref 135–145)
Sodium: 138 mmol/L (ref 135–145)
TCO2: 25 mmol/L (ref 22–32)
TCO2: 23 mmol/L (ref 22–32)
TCO2: 23 mmol/L (ref 22–32)
pCO2 arterial: 36 mmHg (ref 32.0–48.0)
pCO2 arterial: 38.1 mmHg (ref 32.0–48.0)
pCO2 arterial: 39.6 mmHg (ref 32.0–48.0)
pH, Arterial: 7.425 (ref 7.350–7.450)
pH, Arterial: 7.345 — ABNORMAL LOW (ref 7.350–7.450)
pH, Arterial: 7.354 (ref 7.350–7.450)
pO2, Arterial: 145 mmHg — ABNORMAL HIGH (ref 83.0–108.0)
pO2, Arterial: 144 mmHg — ABNORMAL HIGH (ref 83.0–108.0)
pO2, Arterial: 162 mmHg — ABNORMAL HIGH (ref 83.0–108.0)

## 2018-04-14 LAB — BASIC METABOLIC PANEL
Anion gap: 9 (ref 5–15)
BUN: 15 mg/dL (ref 8–23)
CO2: 25 mmol/L (ref 22–32)
Calcium: 8.3 mg/dL — ABNORMAL LOW (ref 8.9–10.3)
Chloride: 105 mmol/L (ref 98–111)
Creatinine, Ser: 0.84 mg/dL (ref 0.44–1.00)
GFR calc Af Amer: >60 mL/min (ref >60)
GFR calc non Af Amer: >60 mL/min (ref >60)
Glucose, Bld: 141 mg/dL — ABNORMAL HIGH (ref 70–99)
Potassium: 4.3 mmol/L (ref 3.5–5.1)
Sodium: 139 mmol/L (ref 135–145)

## 2018-04-14 LAB — CBC
HCT: 32.4 % — ABNORMAL LOW (ref 36.0–46.0)
Hemoglobin: 10.4 g/dL — ABNORMAL LOW (ref 12.0–15.0)
MCH: 31.7 pg (ref 26.0–34.0)
MCHC: 32.1 g/dL (ref 30.0–36.0)
MCV: 98.8 fL (ref 80.0–100.0)
Platelets: 236 10*3/uL (ref 150–400)
RBC: 3.28 MIL/uL — ABNORMAL LOW (ref 3.87–5.11)
RDW: 12.2 % (ref 11.5–15.5)
WBC: 12.2 10*3/uL — ABNORMAL HIGH (ref 4.0–10.5)
nRBC: 0 % (ref 0.0–0.2)

## 2018-04-14 NOTE — Op Note (Addendum)
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  Postop diagnosis same  Procedure: Exploration and recurrent decompressionL3- 4, L4-5 with right transforaminal interbody fusion L3-4 and L4-5.  Pedicle instrumentation.  Cages x2 and bilateral lateral fusion.  Microscope used for decompression for recurrent stenosis.: ( local bone autograft for spine fusion interbody, in cages and bilateral lateral intertransverse process fusion. )  Surgeon: Annell Greening, MD  Assistant Zonia Kief, PA-C medically necessary and present for the entire procedure until closure.  EBL 500 cc with re-transfusion Cell Saver 250 cc  Anesthesia General plus Marcaine skin local  Implants Synthes matrix system with 60 and 65 mm rods.  5 x 35 mm screws in L3, 6 x 35 mm screws in L5.  Left side 5 x 40 mm screw at L4 and right is 6 x 40 mm screw.  Matrix screws.  8 mm T-PAL cage at L3-4 and 9 mm cage at L4-5.  Local bone used for interbody fusion, bone and the cage and bilateral lateral transverse process fusion.  Procedure: After induction general anesthesia patient was placed on the spine frame careful padding and positioning arms at 9090 yellow foam pads calf pumpers DuraPrep area squared with towels Betadine Steri-Drape laminectomy sheets and drapes.  Preoperative Ancef was given redosed at 5 PM 4 hours later and timeout procedure was completed.  Old incision was made subperiosteal dissection.  Previous spinous process had been partially removed for decompression at the L3-4 level and top of L4.  Level above and below were subperiosteally stripped out of the lamina and then followed out to the facets.  Transverse process at L3, L4 and L5 were exposed on both sides.  There was instability present with shifting extension 7 mm anterolisthesis at L3-4 with collapse.  Recurrent stenosis and large lateral disc herniation in the gutter.  Several spot C-arm images were  taken for appropriate localization and canal until finally it was opened up with small narrow osteotome removing overhanging spurs and then the disc base was found.  Progressive mobilization spreading discectomy was performed using angled curettes, ring curettes straight pituitaries up and down-biting pituitaries rasps and shavers.  Intact once good decompression trial sizers progressed and 8 mm cage was selected.  Facet on the right sided been removed.  There was a large amount of disc inferior to the nerve root from above displacing it dorsally this was decompressed out in the foramina.  Nerve root was intact dura was intact.  Cage was inserted packed with bone after bony been meticulously pushed anterior to the cage position and compressed packed.  The banana-shaped cage was kicked over laterally checked under C arm and then identical procedure was repeated at the C4-5 level where there was less scar tissue from surgery as were compressed on both sides final spot pictures were taken which showed good convergence.  Copious irrigation re-dose of antibiotics and then closure with #1 Vicryl in the deep fascia 2-0 Vicryl and 0 Vicryl in the next layer up and skin staple closure Marcaine infiltration postop dressing and transferred recovery room patient was then right neurologically intact and had good relief of her right leg pain and numbness postoperatively.

## 2018-04-14 NOTE — Progress Notes (Signed)
PT Cancellation Note  Patient Details Name: [name] MRN: [id number] DOB: 15-Oct-1940   Cancelled Treatment:    Reason Eval/Treat Not Completed: (P) Other (comment) Aspen brace not in room. RN notified. PT will follow back for Evaluation once Brace is fitted.   Jackolyn Geron B. Beverely RisenVan Fleet PT, DPT Acute Rehabilitation Services Pager [phone] Office [phone]    [name] 04/14/2018, 9:27 AM

## 2018-04-14 NOTE — Progress Notes (Signed)
Orthopedic Tech Progress Note Patient Details:  [name] 1940-10-08 [id number] Called in order  Patient ID: [name], female   DOB: Aug 12, 1940, 78 y.o.   MRN: [id number]   [name] 04/14/2018, 8:40 AM

## 2018-04-14 NOTE — Progress Notes (Signed)
Occupational Therapy Evaluation Patient Details Name: BLESS HANSELMAN MRN: [id number] DOB: June 07, 1940 Today's Date: 04/14/2018    History of Present Illness 78 year old female with history of L 3-4 and L 4-5 stenosis. S/p RIGHT L3-4, L4-5 TRANSFORAMINAL LUMBAR INTERBODY FUSION 04/13/18. PMH: OA, HTN, Pre-diabetes, Fibromyalgia.    Clinical Impression   PTA pt PLOF modified I with use of assistive devices, however, requires assist with LB dressing. Pt currently requires Max A in most ADLs and demonstrates inability to perform self care tasks due to limitation of surgery and back precautions. Pt re-educated of back precautions with visual hand out given. Lengthy conversation to address appropriate placement for next venue setting. Due to family and patient concerns. OT recommends DC setting to SNF followed up with Home health; to ensure pt has the strength and ability to have a safe transfer to home setting. OT will follow acutely to ensure pt will be educated on positioning, safe functional transfers and compensatory techniques.    Follow Up Recommendations  Supervision/Assistance - 24 hour;SNF;Home health OT(Pending family discussion tomorrow.)    Equipment Recommendations       Recommendations for Other Services       Precautions / Restrictions Precautions Precautions: Back Precaution Booklet Issued: Yes (comment) Precaution Comments: Ask to review back precuations from previous session with PT. Pt unable to recall "BLT", OT reeducated pt with verbal demonstration and visual handout. Required Braces or Orthoses: Spinal Brace Restrictions Weight Bearing Restrictions: No      Mobility Bed Mobility Overal bed mobility: Needs Assistance Bed Mobility: Rolling Rolling: Min assist         General bed mobility comments: min A to roll, pt unable to recall precautions.  Transfers                 General transfer comment: NT this session.    Balance                                            ADL either performed or assessed with clinical judgement   ADL Overall ADL's : Needs assistance/impaired Eating/Feeding: Sitting;Cueing for safety;Moderate assistance   Grooming: Maximal assistance;Cueing for safety;Cueing for sequencing;Wash/dry face;Oral care   Upper Body Bathing: Maximal assistance;Cueing for safety;Cueing for sequencing;Sitting   Lower Body Bathing: Maximal assistance;Cueing for safety;Cueing for sequencing;Cueing for back precautions   Upper Body Dressing : Maximal assistance;Cueing for safety;Cueing for sequencing;Sitting   Lower Body Dressing: Maximal assistance;Cueing for safety;Cueing for sequencing;Cueing for back precautions   Toilet Transfer: Maximal assistance;Cueing for safety;Cueing for sequencing   Toileting- Clothing Manipulation and Hygiene: Maximal assistance;Cueing for safety;Cueing for sequencing;Cueing for back precautions         General ADL Comments: Pt requires Max A for most ADLs for safety and adhering to back precuations. Pt/ caregiver education of back precautions with visual handout given.       Vision         Perception     Praxis      Pertinent Vitals/Pain Pain Assessment: Faces Faces Pain Scale: Hurts whole lot Pain Location: back with movement Pain Descriptors / Indicators: Grimacing;Guarding;Throbbing Pain Intervention(s): RN gave pain meds during session(Meds given before session)     Hand Dominance Right   Extremity/Trunk Assessment Upper Extremity Assessment Upper Extremity Assessment: Generalized weakness   Lower Extremity Assessment Lower Extremity Assessment: Defer to PT evaluation   Cervical /  Trunk Assessment Cervical / Trunk Assessment: Other exceptions(S/p lumbar fusion )   Communication Communication Communication: No difficulties   Cognition Arousal/Alertness: Lethargic;Suspect due to medications Behavior During Therapy: Flat affect Overall Cognitive Status:  Impaired/Different from baseline Area of Impairment: Memory;Attention                   Current Attention Level: Selective Memory: Decreased recall of precautions;Decreased short-term memory         General Comments: Pt demonstrates difficulty with retrieval of information due to presented cognitive dysfunction. Daughter reports noticing a significant change in cognition after receiving pain meds.    General Comments  Lengthy conversation with daughter about DC setting, concerned with husbands cogntive function inability to care for pt. Pt has very supportive family trying to weigh the pros and cons of SNF setting and In home care. Brother/Son plans to come to discuss plans with medical team.     Exercises     Shoulder Instructions      Home Living Family/patient expects to be discharged to:: Private residence Living Arrangements: Spouse/significant other Available Help at Discharge: Family Type of Home: House Home Access: Stairs to enter [employer] of Steps: 1   Home Layout: Two level;Able to live on main level with bedroom/bathroom Alternate Level Stairs-Number of Steps: 15   Bathroom Shower/Tub: Producer, television/film/video: Standard Bathroom Accessibility: Yes   Home Equipment: Toilet riser;Grab bars - toilet;Walker - 4 wheels;Shower seat;Cane - single point          Prior Functioning/Environment Level of Independence: Independent with assistive device(s)        Comments: limited community ambulation and assist with LE dressing; report husband assisting with donning socks.         OT Problem List: Decreased range of motion;Decreased activity tolerance;Decreased cognition;Decreased safety awareness;Decreased knowledge of use of DME or AE;Decreased knowledge of precautions;Pain      OT Treatment/Interventions: Self-care/ADL training;Therapeutic exercise;Patient/family education;DME and/or AE instruction    OT Goals(Current goals can be  found in the care plan section) Acute Rehab OT Goals Patient Stated Goal: have less pain OT Goal Formulation: With patient/family Time For Goal Achievement: 04/28/18 Potential to Achieve Goals: Fair  OT Frequency: Min 2X/week   Barriers to D/C: Decreased caregiver support;Other (comment)  Barriers to DC stated above in general comments.       Co-evaluation              AM-PAC OT "6 Clicks" Daily Activity     Outcome Measure Help from another person eating meals?: A Lot Help from another person taking care of personal grooming?: A Lot Help from another person toileting, which includes using toliet, bedpan, or urinal?: A Lot Help from another person bathing (including washing, rinsing, drying)?: A Lot Help from another person to put on and taking off regular upper body clothing?: A Lot Help from another person to put on and taking off regular lower body clothing?: Total 6 Click Score: 11   End of Session Nurse Communication: Precautions  Activity Tolerance: Treatment limited secondary to medical complications (Comment) Patient left: in bed;with call bell/phone within reach;with family/visitor present  OT Visit Diagnosis: Pain;Other (comment) Pain - part of body: (Back - lumbar region)                Time: 8182-9937 OT Time Calculation (min): 34 min Charges:  OT General Charges $OT Visit: 1 Visit OT Evaluation $OT Eval Moderate Complexity: 1 Mod OT Treatments $Self Care/Home  Management : 8-22 mins  Marquette OldEvan Erynn Vaca, MSOT, OTR/L  Supplemental Rehabilitation Services  [phone]  Zigmund Danielvan M Trypp Heckmann 04/14/2018, 3:17 PM

## 2018-04-14 NOTE — Progress Notes (Signed)
CSW acknowledges social work consult and spoke with Eileen Stanford, patient's daughter regarding discharge planning.   Patient's daughter reports patient is very against Skilled nursing facilities as her mother passed away in a facility, she states family is able to provide support at home but not 24/7 supervision as recommended.   Eileen Stanford states she would like to consider other in home support and options before resorting to SNF.   CSW notified RNCM who will speak with daughter regarding options other than SNF for family to make an informed decision.   [place], [place] [phone]

## 2018-04-14 NOTE — Anesthesia Postprocedure Evaluation (Signed)
Anesthesia Post Note  Patient: [name]  Procedure(s) Performed: RIGHT L3-4, L4-5 TRANSFORAMINAL LUMBAR INTERBODY FUSION, REDO COMPRESSION, CAGES, PEDICLE INSTRUMENTATION (N/A Spine Lumbar)     Patient location during evaluation: Other Anesthesia Type: General Level of consciousness: awake and alert Pain management: pain level controlled Vital Signs Assessment: post-procedure vital signs reviewed and stable Respiratory status: spontaneous breathing, nonlabored ventilation, respiratory function stable and patient connected to nasal cannula oxygen Cardiovascular status: blood pressure returned to baseline and stable Postop Assessment: no apparent nausea or vomiting Anesthetic complications: no    Last Vitals:  Vitals:   04/13/18 2035 04/14/18 0045  BP: (!) 157/86 (!) 157/87  Pulse: 87 98  Resp: 16 16  Temp: (!) 36.3 C 36.7 C  SpO2: 99% 97%    Last Pain:  Vitals:   04/14/18 0936  TempSrc:   PainSc: 2                  Shelton Silvas

## 2018-04-14 NOTE — Plan of Care (Signed)
  Problem: Activity: Goal: Risk for activity intolerance will decrease Outcome: Progressing   Problem: Nutrition: Goal: Adequate nutrition will be maintained Outcome: Progressing   Problem: Elimination: Goal: Will not experience complications related to bowel motility Outcome: Progressing Goal: Will not experience complications related to urinary retention Outcome: Progressing   Problem: Safety: Goal: Ability to remain free from injury will improve Outcome: Progressing   Problem: Skin Integrity: Goal: Risk for impaired skin integrity will decrease Outcome: Progressing   Problem: Elimination: Goal: Will not experience complications related to urinary retention Outcome: Progressing

## 2018-04-14 NOTE — Evaluation (Signed)
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 S/p RIGHT L3-4, L4-5 TRANSFORAMINAL LUMBAR INTERBODY FUSION 04/13/18. PMH: OA, HTN, Pre-diabetes, Fibromyalgia.   Clinical Impression  PTA pt with decreasing mobility and ability to perform ADLs since summer 2019. Pt limited to household ambulation with cane and assist for lower body dressing . Pt is currently is limited in safe mobility by 10/10 pain, cognitive deficits (thought to be from pain medication) and generalized weakness. Pt requires modA for bed mobility, transfers and ambulation of 3 feet with RW. Pt lives with husband who is having some cognitive difficulties of his own and would not be able to provide effective 24 hour support. PT recommending SNF level rehab at discharge to improve strength to be able to independently move before d/c home. PT will continue to follow acutely.     Follow Up Recommendations SNF    Equipment Recommendations  (TBD at next venue)       Precautions / Restrictions Precautions Precautions: Back Precaution Booklet Issued: Yes (comment) Precaution Comments: reviewed back precautions 3x and unable to recall without maximal cuing Required Braces or Orthoses: Spinal Brace Spinal Brace: Applied in sitting position;Lumbar corset Restrictions Weight Bearing Restrictions: No      Mobility  Bed Mobility Overal bed mobility: Needs Assistance Bed Mobility: Rolling;Sidelying to Sit Rolling: Min assist Sidelying to sit: Mod assist       General bed mobility comments: min A to roll onto L side for dressing change, modA and vc for not twisting to manage LE off bed and bring trunks to upright  Transfers Overall transfer level: Needs assistance Equipment used: Rolling walker (2 wheeled) Transfers: Sit to/from Stand Sit to Stand: Mod assist          General transfer comment: modA for power up into standing, and steadying, pt with posterior lean for support of LE on EoB  Ambulation/Gait Ambulation/Gait assistance: Mod assist Gait Distance (Feet): 3 Feet Assistive device: Rolling walker (2 wheeled) Gait Pattern/deviations: Decreased step length - right;Decreased step length - left;Step-through pattern;Shuffle;Antalgic Gait velocity: slowed Gait velocity interpretation: <1.31 ft/sec, indicative of household ambulator General Gait Details: modA for steadying with RW for extremely small slow steps to recliner, slight bilateral knee buckling which required assist to steady       Balance Overall balance assessment: Needs assistance Sitting-balance support: Feet supported;No upper extremity supported Sitting balance-Leahy Scale: Fair     Standing balance support: Bilateral upper extremity supported Standing balance-Leahy Scale: Poor Standing balance comment: requires RW assist                             Pertinent Vitals/Pain Pain Assessment: 0-10 Pain Score: 10-Worst pain ever Pain Location: back with movement Pain Descriptors / Indicators: Grimacing;Guarding;Throbbing Pain Intervention(s): Limited activity within patient's tolerance;Monitored during session;Premedicated before session;Repositioned    Home Living Family/patient expects to be discharged to:: Private residence Living Arrangements: Spouse/significant other Available Help at Discharge: Family Type of Home: House Home Access: Stairs to enter   [employer] of Steps: 1 Home Layout: Two level;Able to live on main level with bedroom/bathroom Home Equipment: Toilet riser;Grab bars - toilet;Walker - 4 wheels;Shower seat;Cane - single point      Prior Function Level of Independence: Independent with assistive device(s)  Comments: limited community ambulation and assist with LE dressing        Extremity/Trunk Assessment    Upper Extremity Assessment Upper Extremity Assessment: Generalized weakness    Lower Extremity Assessment Lower Extremity Assessment: Generalized weakness    Cervical / Trunk Assessment Cervical / Trunk Assessment: Other exceptions(S/p lumbar fusion )  Communication   Communication: No difficulties  Cognition Arousal/Alertness: Lethargic;Suspect due to medications Behavior During Therapy: Flat affect Overall Cognitive Status: Impaired/Different from baseline Area of Impairment: Memory;Attention                   Current Attention Level: Selective Memory: Decreased recall of precautions;Decreased short-term memory         General Comments: Pt with very slow processing of information and repetition of answers during home set up questions. Daughter reports not baseline, possibly due to pain medication      General Comments General comments (skin integrity, edema, etc.): RN changed dressing during session, incision with moderate amount of drainange,         Assessment/Plan    PT Assessment Patient needs continued PT services  PT Problem List Decreased strength;Decreased activity tolerance;Decreased balance;Decreased range of motion;Decreased mobility;Decreased cognition;Decreased safety awareness;Pain       PT Treatment Interventions DME instruction;Gait training;Functional mobility training;Therapeutic activities;Therapeutic exercise;Stair training;Balance training;Patient/family education    PT Goals (Current goals can be found in the Care Plan section)  Acute Rehab PT Goals Patient Stated Goal: have less pain PT Goal Formulation: With patient Time For Goal Achievement: 04/28/18 Potential to Achieve Goals: Fair    Frequency Min 5X/week   Barriers to discharge        Co-evaluation               AM-PAC PT "6 Clicks" Mobility  Outcome Measure Help needed turning from your back to your side while in a flat bed without using bedrails?: A  Little Help needed moving from lying on your back to sitting on the side of a flat bed without using bedrails?: A Lot Help needed moving to and from a bed to a chair (including a wheelchair)?: A Lot Help needed standing up from a chair using your arms (e.g., wheelchair or bedside chair)?: A Lot Help needed to walk in hospital room?: Total Help needed climbing 3-5 steps with a railing? : Total 6 Click Score: 11    End of Session Equipment Utilized During Treatment: Gait belt;Back brace Activity Tolerance: Patient limited by pain Patient left: in chair;with call bell/phone within reach;with chair alarm set;with family/visitor present Nurse Communication: Mobility status PT Visit Diagnosis: Unsteadiness on feet (R26.81);Other abnormalities of gait and mobility (R26.89);Muscle weakness (generalized) (M62.81);Difficulty in walking, not elsewhere classified (R26.2);Pain Pain - part of body: (back)    Time: [id number] PT Time Calculation (min) (ACUTE ONLY): 48 min   Charges:   PT Evaluation $PT Eval Moderate Complexity: 1 Mod PT Treatments $Gait Training: 23-37 mins        Venisa Frampton B. Beverely RisenVan Fleet PT, DPT Acute Rehabilitation Services Pager [phone] Office [phone]   Elon Alaslizabeth B Van Fleet 04/14/2018, 12:40 PM

## 2018-04-14 NOTE — Progress Notes (Signed)
   Subjective: 1 Day Post-Op Procedure(s) (LRB): RIGHT L3-4, L4-5 TRANSFORAMINAL LUMBAR INTERBODY FUSION, REDO COMPRESSION, CAGES, PEDICLE INSTRUMENTATION (N/A) Patient reports pain as mild and moderate.  Eating breakfast, had pain meds and slightly confused. Moves legs but some trouble following instructions since the pain meds were given per her husband.  Objective: Vital signs in last 24 hours: Temp:  [97 F (36.1 C)-98 F (36.7 C)] 98 F (36.7 C) (01/23 0045) Pulse Rate:  [80-98] 98 (01/23 0045) Resp:  [8-20] 16 (01/23 0045) BP: (128-175)/(65-87) 157/87 (01/23 0045) SpO2:  [90 %-100 %] 97 % (01/23 0045) Arterial Line BP: (176-183)/(72-79) 180/79 (01/22 1813) Weight:  [77.7 kg] 77.7 kg (01/22 1039)  Intake/Output from previous day: 01/22 0701 - 01/23 0700 In: 3250 [I.V.:2500; Blood:250; IV Piggyback:500] Out: 1455 [Urine:955; Blood:500] Intake/Output this shift: No intake/output data recorded.  Recent Labs    04/14/18 0248  HGB 10.4*   Recent Labs    04/14/18 0248  WBC 12.2*  RBC 3.28*  HCT 32.4*  PLT 236   Recent Labs    04/14/18 0248  NA 139  K 4.3  CL 105  CO2 25  BUN 15  CREATININE 0.84  GLUCOSE 141*  CALCIUM 8.3*   No results for input(s): LABPT, INR in the last 72 hours.  Neurologically intact Dg Chest 2 View  Result Date: 04/13/2018 CLINICAL DATA:  Preoperative evaluation for back surgery, history GERD, hypertension EXAM: CHEST - 2 VIEW COMPARISON:  05/04/2017 FINDINGS: Normal heart size, mediastinal contours, and pulmonary vascularity. Atherosclerotic calcification aorta. Lungs clear. No infiltrate, pleural effusion or pneumothorax. Bones unremarkable. IMPRESSION: No acute abnormalities. Electronically Signed   By: Ulyses Southward M.D.   On: 04/13/2018 11:02   Dg Lumbar Spine 2-3 Views  Result Date: 04/13/2018 CLINICAL DATA:  L3-L5 posterior fusion EXAM: LUMBAR SPINE - 2-3 VIEW; DG C-ARM 61-120 MIN COMPARISON:  03/17/2018, 05/10/2017 FINDINGS: Spot  fluoroscopic intraoperative views demonstrate bipedicular screw and rod fixation posteriorly spanning L3-L5. Disc spacers noted at both levels. Stable alignment. No complicating feature. Endplate degenerative changes noted. IMPRESSION: Status post L3-L5 posterior fusion. Anatomic alignment. No complicating feature by plain radiography. Electronically Signed   By: Judie Petit.  Shick M.D.   On: 04/13/2018 19:16   Dg C-arm 1-60 Min  Result Date: 04/13/2018 CLINICAL DATA:  L3-L5 posterior fusion EXAM: LUMBAR SPINE - 2-3 VIEW; DG C-ARM 61-120 MIN COMPARISON:  03/17/2018, 05/10/2017 FINDINGS: Spot fluoroscopic intraoperative views demonstrate bipedicular screw and rod fixation posteriorly spanning L3-L5. Disc spacers noted at both levels. Stable alignment. No complicating feature. Endplate degenerative changes noted. IMPRESSION: Status post L3-L5 posterior fusion. Anatomic alignment. No complicating feature by plain radiography. Electronically Signed   By: Judie Petit.  Shick M.D.   On: 04/13/2018 19:16    Assessment/Plan: 1 Day Post-Op Procedure(s) (LRB): RIGHT L3-4, L4-5 TRANSFORAMINAL LUMBAR INTERBODY FUSION, REDO COMPRESSION, CAGES, PEDICLE INSTRUMENTATION (N/A) Up with therapy , had weak quad pre-op with falling . Will see how she does with therapy.   [name] 04/14/2018, 8:16 AM

## 2018-04-14 NOTE — Progress Notes (Signed)
OT Cancellation Note  Patient Details Name: [name] MRN: [id number] DOB: 11/24/1940   Cancelled Treatment:    Reason Eval/Treat Not Completed: Other (comment)(aspen brace not in room). OT will continue to follow for evaluation, and attempt later today after brace is delivered and fitted  Emelda Fear 04/14/2018, 10:27 AM   Sherryl Manges OTR/L Acute Rehabilitation Services Pager: [phone] Office: [phone]

## 2018-04-15 ENCOUNTER — Inpatient Hospital Stay (HOSPITAL_COMMUNITY): Payer: Medicare Other

## 2018-04-15 LAB — BASIC METABOLIC PANEL
Anion gap: 8 (ref 5–15)
BUN: 12 mg/dL (ref 8–23)
CO2: 24 mmol/L (ref 22–32)
Calcium: 8.6 mg/dL — ABNORMAL LOW (ref 8.9–10.3)
Chloride: 104 mmol/L (ref 98–111)
Creatinine, Ser: 0.76 mg/dL (ref 0.44–1.00)
GFR calc Af Amer: >60 mL/min (ref >60)
GFR calc non Af Amer: >60 mL/min (ref >60)
Glucose, Bld: 182 mg/dL — ABNORMAL HIGH (ref 70–99)
Potassium: 3.3 mmol/L — ABNORMAL LOW (ref 3.5–5.1)
Sodium: 136 mmol/L (ref 135–145)

## 2018-04-15 LAB — CBC WITH DIFFERENTIAL/PLATELET
Abs Immature Granulocytes: 0.06 10*3/uL (ref 0.00–0.07)
Basophils Absolute: 0 10*3/uL (ref 0.0–0.1)
Basophils Relative: 0 %
Eosinophils Absolute: 0 10*3/uL (ref 0.0–0.5)
Eosinophils Relative: 0 %
HCT: 26.3 % — ABNORMAL LOW (ref 36.0–46.0)
Hemoglobin: 8.5 g/dL — ABNORMAL LOW (ref 12.0–15.0)
Immature Granulocytes: 1 %
Lymphocytes Relative: 19 %
Lymphs Abs: 2.3 10*3/uL (ref 0.7–4.0)
MCH: 32.2 pg (ref 26.0–34.0)
MCHC: 32.3 g/dL (ref 30.0–36.0)
MCV: 99.6 fL (ref 80.0–100.0)
Monocytes Absolute: 1.1 10*3/uL — ABNORMAL HIGH (ref 0.1–1.0)
Monocytes Relative: 9 %
Neutro Abs: 8.7 10*3/uL — ABNORMAL HIGH (ref 1.7–7.7)
Neutrophils Relative %: 71 %
Platelets: 209 10*3/uL (ref 150–400)
RBC: 2.64 MIL/uL — ABNORMAL LOW (ref 3.87–5.11)
RDW: 13 % (ref 11.5–15.5)
WBC: 12.2 10*3/uL — ABNORMAL HIGH (ref 4.0–10.5)
nRBC: 0 % (ref 0.0–0.2)

## 2018-04-15 MED ORDER — HYDROCODONE-ACETAMINOPHEN 7.5-325 MG PO TABS: 1.0000 | ORAL_TABLET | Freq: Four times a day (QID) | ORAL | 0 refills | Status: AC | PRN

## 2018-04-15 MED ORDER — HYDROCODONE-ACETAMINOPHEN 5-325 MG PO TABS
1.0000 | ORAL_TABLET | Freq: Four times a day (QID) | ORAL | Status: DC | PRN
  Administered 2018-04-15: 2 via ORAL
  Administered 2018-04-15: 1 via ORAL
  Administered 2018-04-16 – 2018-04-17 (×5): 2 via ORAL
  Filled 2018-04-15 (×2): qty 2
  Filled 2018-04-15: qty 1
  Filled 2018-04-15 (×4): qty 2

## 2018-04-15 MED ORDER — BISACODYL 10 MG RE SUPP
10.0000 mg | Freq: Once | RECTAL | Status: AC
  Administered 2018-04-15: 10 mg via RECTAL
  Filled 2018-04-15: qty 1

## 2018-04-15 MED ORDER — PREGABALIN 100 MG PO CAPS: 100.0000 mg | ORAL_CAPSULE | Freq: Two times a day (BID) | ORAL | 2 refills | Status: DC

## 2018-04-15 MED ORDER — PREGABALIN 100 MG PO CAPS
100.0000 mg | ORAL_CAPSULE | Freq: Two times a day (BID) | ORAL | Status: DC
  Administered 2018-04-15 – 2018-04-17 (×4): 100 mg via ORAL
  Filled 2018-04-15 (×4): qty 1

## 2018-04-15 NOTE — Discharge Instructions (Signed)
Walk daily with walker . OK to shower. See dr. Ophelia Charter in one week.

## 2018-04-15 NOTE — Progress Notes (Addendum)
   Subjective: 2 Days Post-Op Procedure(s) (LRB): RIGHT L3-4, L4-5 TRANSFORAMINAL LUMBAR INTERBODY FUSION, REDO COMPRESSION, CAGES, PEDICLE INSTRUMENTATION (N/A) Patient reports pain as mild and moderate.  Increased pain when she gets up. Found on floor at bedside.    Objective: Vital signs in last 24 hours: Temp:  [98.2 F (36.8 C)-98.7 F (37.1 C)] 98.2 F (36.8 C) (01/24 0408) Pulse Rate:  [101-116] 116 (01/24 0408) Resp:  [15-16] 15 (01/24 0408) BP: (123-142)/(50-57) 123/56 (01/24 0408) SpO2:  [92 %-94 %] 92 % (01/24 0408)  Intake/Output from previous day: 01/23 0701 - 01/24 0700 In: 1165.1 [P.O.:360; I.V.:805.1] Out: 2000 [Urine:2000] Intake/Output this shift: Total I/O In: 240 [P.O.:240] Out: 250 [Urine:250]  Recent Labs    04/13/18 1344 04/13/18 1618 04/13/18 1626 04/14/18 0248  HGB 10.9* 8.8* 8.8* 10.4*   Recent Labs    04/13/18 1626 04/14/18 0248  WBC  --  12.2*  RBC  --  3.28*  HCT 26.0* 32.4*  PLT  --  236   Recent Labs    04/13/18 1626 04/14/18 0248  NA 137 139  K 4.1 4.3  CL  --  105  CO2  --  25  BUN  --  15  CREATININE  --  0.84  GLUCOSE  --  141*  CALCIUM  --  8.3*   No results for input(s): LABPT, INR in the last 72 hours.  Neurologically intact, in supine position quad right and left strong.  No results found.  Assessment/Plan: 2 Days Post-Op Procedure(s) (LRB): RIGHT L3-4, L4-5 TRANSFORAMINAL LUMBAR INTERBODY FUSION, REDO COMPRESSION, CAGES, PEDICLE INSTRUMENTATION (N/A) Up with therapy, check xrays lumbar spine post fall. CBC and BMET. Dressing change. Decrease lyrica dose. Rx sent for post op pain norco 7.5 to her pharmacy. Possible discharge Saturday .    Eldred MangesMark C Willetta York 04/15/2018, 9:34 AM

## 2018-04-15 NOTE — Plan of Care (Signed)
  Problem: Education: Goal: Knowledge of General Education information will improve Description Including pain rating scale, medication(s)/side effects and non-pharmacologic comfort measures Outcome: Progressing   Problem: Clinical Measurements: Goal: Ability to maintain clinical measurements within normal limits will improve Outcome: Progressing Goal: Will remain free from infection Outcome: Progressing Goal: Diagnostic test results will improve Outcome: Progressing Goal: Respiratory complications will improve Outcome: Not Applicable   Problem: Activity: Goal: Risk for activity intolerance will decrease Outcome: Progressing   Problem: Nutrition: Goal: Adequate nutrition will be maintained Outcome: Progressing

## 2018-04-15 NOTE — Care Management Note (Signed)
Case Management Note  Patient Details  Name: [name] MRN: [id number] Date of Birth: November 19, 1940  Subjective/Objective:  78 yr old female s/p : Exploration and recurrent decompressionL3- 4, L4-5 with right transforaminal interbody fusion L3-4 and L4-5.                   Action/Plan: Case manager spoke with patient, her husband and daughter concerning discharge plan. Therapy has suggested patient go to SNF for shortterm rehab but patient wants to go home. Patient's daughter, Eileen StanfordJenna says that they want 24/7 private duty care for patient at her home. [facility] and are working out the needs. CM received a call from Prudy FeelerLisa Boland with [facility]. Referral for Home Health PT/OT  Called to Lorenza Chickory Barnett, Liaison [facility]. Misty StanleyLisa will be coming by hospital to do assessment and meet with patient on Saturday morning.    Expected Discharge Date:    04/16/2018              Expected Discharge Plan:  Home w Home Health Services  In-House Referral:  NA  Discharge planning Services  CM Consult  Post Acute Care Choice:  Home Health Choice offered to:  Patient  DME Arranged:   Rolling walker DME Agency:   [facility]   HH Arranged:  PT, OT HH Agency:   [facility]  Status of Service:  In process, will continue to follow  If discussed at Long Length of Stay Meetings, dates discussed:    Additional Comments:  Durenda GuthrieBrady, Tawney Vanorman Naomi, RN 04/15/2018, 3:13 PM

## 2018-04-15 NOTE — Progress Notes (Signed)
Patient found in sitting in floor. The patient was alert and oriented but not aware of how she got to the floor and how long she had been there. No signs or symptoms of distress or trama. She was help back to her feet and placed back in bed with 3 side rails up, bed alarm on, call light in reach, bed at lowest position and floor mats in place. On call Dr Otelia Sergeant was made aware with no new orders at this time. The family member was called and  made aware of the situation.

## 2018-04-15 NOTE — Care Management Important Message (Signed)
Important Message  Patient Details  Name: [name] MRN: [id number] Date of Birth: 1940-11-12   Medicare Important Message Given:  Yes    Taquanna Borras P Kamauri Denardo 04/15/2018, 11:23 AM

## 2018-04-15 NOTE — Progress Notes (Signed)
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 S/p RIGHT L3-4, L4-5 TRANSFORAMINAL LUMBAR INTERBODY FUSION 04/13/18. PMH: OA, HTN, Pre-diabetes, Fibromyalgia.     PT Comments    Pt sitting up in recliner on entry and willing to walk with therapy. Pt is limited in safe mobility today by increased back and LE pain and associated strength and endurance deficits. Pt currently requires mod for transfers, ambulation of 18 feet with RW and for management of LE back into bed when transferring sit to supine. PT continues to recommend SNF level rehab at d/c. PT will continue to follow acutely.   Follow Up Recommendations  SNF     Equipment Recommendations  (TBD at next venue)       Precautions / Restrictions Precautions Precautions: Back Precaution Booklet Issued: Yes (comment) Precaution Comments: reviewed back precautions 3x and unable to recall without maximal cuing Required Braces or Orthoses: Spinal Brace Spinal Brace: Applied in sitting position;Lumbar corset Restrictions Weight Bearing Restrictions: No    Mobility  Bed Mobility Overal bed mobility: Needs Assistance Bed Mobility: Rolling Rolling: Min assist     Sit to supine: Mod assist   General bed mobility comments: OOB in recliner on entry, modA for management of LE back into bed   Transfers Overall transfer level: Needs assistance Equipment used: Rolling walker (2 wheeled) Transfers: Sit to/from Stand Sit to Stand: Mod assist         General transfer comment: modA for power up into standing, and steadying,   Ambulation/Gait Ambulation/Gait assistance: Min assist;Mod assist Gait Distance (Feet): 18 Feet Assistive device: Rolling walker (2 wheeled) Gait Pattern/deviations: Decreased step length - right;Decreased step length - left;Step-through  pattern;Shuffle;Antalgic Gait velocity: slowed Gait velocity interpretation: <1.31 ft/sec, indicative of household ambulator General Gait Details: minA progressing to modA for steadying with RW, vc for proximity to RW, RW managment, and upright posture           Balance Overall balance assessment: Needs assistance Sitting-balance support: Feet supported;No upper extremity supported Sitting balance-Leahy Scale: Fair     Standing balance support: Bilateral upper extremity supported Standing balance-Leahy Scale: Poor Standing balance comment: requires RW assist                            Cognition Arousal/Alertness: Awake/alert Behavior During Therapy: Flat affect Overall Cognitive Status: Impaired/Different from baseline Area of Impairment: Memory;Attention                   Current Attention Level: Selective Memory: Decreased recall of precautions;Decreased short-term memory         General Comments: pt continues to have slow processing      Exercises      General Comments General comments (skin integrity, edema, etc.): RN changed dressing in sitting EoB before getting back to bed      Pertinent Vitals/Pain Pain Assessment: Faces Faces Pain Scale: Hurts even more Pain Location: back with movement Pain Descriptors / Indicators: Grimacing;Guarding;Throbbing Pain Intervention(s): Limited activity within patient's tolerance;Monitored during session;Repositioned    Home Living Family/patient expects to be discharged to:: Private residence Living Arrangements: Spouse/significant other Available Help at Discharge: Family Type of Home: House Home Access: Stairs to enter   Home Layout: Two level;Able to live on main level with bedroom/bathroom Home Equipment: Toilet riser;Grab  bars - toilet;Walker - 4 wheels;Shower seat;Cane - single point      Prior Function Level of Independence: Independent with assistive device(s)      Comments: limited  community ambulation and assist with LE dressing; report husband assisting with donning socks.    PT Goals (current goals can now be found in the care plan section) Acute Rehab PT Goals Patient Stated Goal: have less pain PT Goal Formulation: With patient Time For Goal Achievement: 04/28/18 Potential to Achieve Goals: Fair Progress towards PT goals: Progressing toward goals    Frequency    Min 5X/week      PT Plan Current plan remains appropriate       AM-PAC PT "6 Clicks" Mobility   Outcome Measure  Help needed turning from your back to your side while in a flat bed without using bedrails?: A Little Help needed moving from lying on your back to sitting on the side of a flat bed without using bedrails?: A Lot Help needed moving to and from a bed to a chair (including a wheelchair)?: A Lot Help needed standing up from a chair using your arms (e.g., wheelchair or bedside chair)?: A Lot Help needed to walk in hospital room?: Total Help needed climbing 3-5 steps with a railing? : Total 6 Click Score: 11    End of Session Equipment Utilized During Treatment: Gait belt;Back brace Activity Tolerance: Patient limited by pain Patient left: in chair;with call bell/phone within reach;with chair alarm set;with family/visitor present Nurse Communication: Mobility status PT Visit Diagnosis: Unsteadiness on feet (R26.81);Other abnormalities of gait and mobility (R26.89);Muscle weakness (generalized) (M62.81);Difficulty in walking, not elsewhere classified (R26.2);Pain Pain - part of body: (back)     Time: 1211-1232 PT Time Calculation (min) (ACUTE ONLY): 21 min  Charges:  $Gait Training: 8-22 mins                     [name]. [name] PT, DPT Acute Rehabilitation Services Pager [phone] Office [phone]    [name] 04/15/2018, 5:01 PM

## 2018-04-15 NOTE — Progress Notes (Signed)
The patient refused to take po oxycodone due to a fear of getting addicted to it and requested IV dilaudid. Patient was educated on the benefit of possible taking oral oxycodone instead of IV dilaudid and agreed to take 5mg  oxycodone if her pain were to come back.

## 2018-04-15 NOTE — Progress Notes (Signed)
No change in x-rays from Intra-Op fluoroscopy.  Left L5 pedicle screw comes in high on the pedicle but is in a safe zone.  Good convergence.  Prescription for Norco 7.5 sent into her pharmacy that they can pick up on Saturday.  Office follow-up 1 week.  CBC stable.  Dilaudid discontinued.  Percocet may have been a little bit strong she can stay with the Norco that she took before her hospitalization.  Continue brace walking and office follow-up 1 week with Dr. Ophelia Charter.  Plan is for discharge Saturday morning.

## 2018-04-15 NOTE — Plan of Care (Signed)
  Problem: Activity: Goal: Risk for activity intolerance will decrease Outcome: Progressing   Problem: Elimination: Goal: Will not experience complications related to bowel motility Outcome: Progressing   

## 2018-04-16 NOTE — Progress Notes (Signed)
Subjective: Patient stable.  Having a lot of pain when she is getting up and around.  Very limited mobility from bed to door.  She requires a lot of assistance.  Not really ready to go home yet today due to decreased walking endurance   Objective: Vital signs in last 24 hours: Temp:  [98.7 F (37.1 C)-99 F (37.2 C)] 98.7 F (37.1 C) (01/25 0407) Pulse Rate:  [99-101] 99 (01/25 0407) Resp:  [16] 16 (01/25 0407) BP: (133-164)/(57-78) 164/78 (01/25 0407) SpO2:  [92 %-96 %] 92 % (01/25 0407)  Intake/Output from previous day: 01/24 0701 - 01/25 0700 In: 1149.6 [P.O.:1020; I.V.:129.6] Out: 500 [Urine:500] Intake/Output this shift: No intake/output data recorded.  Exam:  Compartment soft Patient has some dorsiflexion weakness of the ankle on that left-hand side.  Right ankle dorsiflexion 5+ out of 5  Labs: Recent Labs    04/13/18 1344 04/13/18 1618 04/13/18 1626 04/14/18 0248 04/15/18 1240  HGB 10.9* 8.8* 8.8* 10.4* 8.5*   Recent Labs    04/14/18 0248 04/15/18 1240  WBC 12.2* 12.2*  RBC 3.28* 2.64*  HCT 32.4* 26.3*  PLT 236 209   Recent Labs    04/14/18 0248 04/15/18 1240  NA 139 136  K 4.3 3.3*  CL 105 104  CO2 25 24  BUN 15 12  CREATININE 0.84 0.76  GLUCOSE 141* 182*  CALCIUM 8.3* 8.6*   No results for input(s): LABPT, INR in the last 72 hours.  Assessment/Plan: Impression is pain following fusion.  Plan is AFO for that left foot to assist with ambulation.  Physical therapy today.  We will reassess discharge tomorrow.   G Scott Vika Buske 04/16/2018, 8:00 AM

## 2018-04-16 NOTE — Progress Notes (Signed)
Orthopedic Tech Progress Note Patient Details:  [name] September 06, 1940 [id number] Called in order  Patient ID: [name], female   DOB: 11/15/1940, 78 y.o.   MRN: [id number]   [name] 04/16/2018, 9:32 AM

## 2018-04-16 NOTE — Plan of Care (Signed)
  Problem: Pain Managment: Goal: General experience of comfort will improve Outcome: Progressing   Problem: Safety: Goal: Ability to remain free from injury will improve Outcome: Progressing   Problem: Activity: Goal: Will remain free from falls Outcome: Progressing   Problem: Pain Management: Goal: Pain level will decrease Outcome: Progressing   Problem: Skin Integrity: Goal: Will show signs of wound healing Outcome: Progressing

## 2018-04-16 NOTE — Progress Notes (Signed)
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 S/p RIGHT L3-4, L4-5 TRANSFORAMINAL LUMBAR INTERBODY FUSION 04/13/18. PMH: OA, HTN, Pre-diabetes, Fibromyalgia.    OT comments  Pt. Agreeable to participation in skilled OT.  Able to complete bed mobility, ambulation to/from b.room and standing grooming tasks with min/mod A.  Daughter present for session and hands on with education training.  Reports her and other family members will be assisting at home in shifts upon d/c home.    Follow Up Recommendations  Supervision/Assistance - 24 hour;SNF;Home health OT    Equipment Recommendations       Recommendations for Other Services      Precautions / Restrictions Precautions Precautions: Back Precaution Comments: reviewed precautions throughout session with pt. and dtr. who was present Required Braces or Orthoses: Spinal Brace Spinal Brace: Applied in sitting position;Lumbar corset       Mobility Bed Mobility Overal bed mobility: Needs Assistance Bed Mobility: Rolling;Sidelying to Sit Rolling: Min assist Sidelying to sit: Min assist       General bed mobility comments: hob flat, no rails pt. exited from L side.  able to complete with min a   Transfers Overall transfer level: Needs assistance Equipment used: Rolling walker (2 wheeled) Transfers: Sit to/from [employer] Sit to Stand: Mod assist Stand pivot transfers: Min assist       General transfer comment: modA for power up into standing, and steadying, cues for hand placement during sit/stand and stand/sit    Balance                                           ADL either performed or assessed with clinical judgement   ADL Overall ADL's : Needs assistance/impaired     Grooming: Wash/dry hands;Min guard;Standing            Upper Body Dressing : Maximal assistance;Cueing for safety;Cueing for sequencing;Sitting Upper Body Dressing Details (indicate cue type and reason): dtr. assisting her mother with donning the brace   Lower Body Dressing Details (indicate cue type and reason): unable, states her husband has been doing it for her for years and will continue to do it for her Toilet Transfer: Minimal assistance;Ambulation;RW;BSC;Regular Toilet;Cueing for sequencing;Cueing for safety Toilet Transfer Details (indicate cue type and reason): amb. to b.room and used 3n1 over the commode Toileting- Clothing Manipulation and Hygiene: Min guard;Sit to/from stand       Functional mobility during ADLs: Minimal assistance;Rolling walker;Moderate assistance       Vision       Perception     Praxis      Cognition Arousal/Alertness: Awake/alert Behavior During Therapy: WFL for tasks assessed/performed                                            Exercises     Shoulder Instructions       General Comments      Pertinent Vitals/ Pain       Pain Assessment: Faces Faces Pain Scale: Hurts little more Pain Location: back with movement Pain Descriptors / Indicators: Grimacing;Guarding Pain Intervention(s): Limited activity within patient's tolerance;Monitored during  session;Repositioned  Home Living                                          Prior Functioning/Environment              Frequency  Min 2X/week        Progress Toward Goals  OT Goals(current goals can now be found in the care plan section)  Progress towards OT goals: Progressing toward goals     Plan      Co-evaluation                 AM-PAC OT "6 Clicks" Daily Activity     Outcome Measure   Help from another person eating meals?: A Lot Help from another person taking care of personal grooming?: A Lot Help from another person toileting, which includes using toliet, bedpan, or  urinal?: A Lot Help from another person bathing (including washing, rinsing, drying)?: A Lot Help from another person to put on and taking off regular upper body clothing?: A Lot Help from another person to put on and taking off regular lower body clothing?: Total 6 Click Score: 11    End of Session Equipment Utilized During Treatment: Gait belt;Rolling walker;Back brace  OT Visit Diagnosis: Pain;Other (comment)   Activity Tolerance Patient tolerated treatment well   Patient Left in chair;with call bell/phone within reach;with family/visitor present   Nurse Communication          Time: 9509-3267 OT Time Calculation (min): 27 min  Charges: OT General Charges $OT Visit: 1 Visit OT Treatments $Self Care/Home Management : 23-37 mins   Robet Leu, COTA/L 04/16/2018, 9:26 AM

## 2018-04-17 MED ORDER — OXYCODONE HCL 5 MG PO TABS: 5.0000 mg | ORAL_TABLET | Freq: Four times a day (QID) | ORAL | Status: DC | PRN

## 2018-04-17 NOTE — Progress Notes (Signed)
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 S/p RIGHT L3-4, L4-5 TRANSFORAMINAL LUMBAR INTERBODY FUSION 04/13/18. PMH: OA, HTN, Pre-diabetes, Fibromyalgia.    OT comments  Patient progressing slowly.  Continues to require cueing for hand placement during transfers, spinal precautions and adherence to during functional tasks.  Increased time and effort for all activities, guarded mobility using RW, and limited by pain. Able to engage in LB dressing using 1 handed technique to pull pants over hips.  Educated on L AFO, skin hygiene and protection.  Will have 24/7 support at discharge initially. Plans to dc home today.    Follow Up Recommendations  Home health OT;Supervision/Assistance - 24 hour    Equipment Recommendations  None recommended by OT    Recommendations for Other Services      Precautions / Restrictions Precautions Precautions: Back Precaution Booklet Issued: Yes (comment) Precaution Comments: reviewed precautions throughout session with pt. and dtr. who was present Required Braces or Orthoses: Spinal Brace;Other Brace Spinal Brace: Applied in sitting position;Lumbar corset Other Brace: AFO, L LE Restrictions Weight Bearing Restrictions: No       Mobility Bed Mobility Overal bed mobility: Needs Assistance Bed Mobility: Rolling;Sit to Sidelying Rolling: Supervision       Sit to sidelying: Supervision General bed mobility comments: close supervision, verbal cueing but not physical assistance required  Transfers Overall transfer level: Needs assistance Equipment used: Rolling walker (2 wheeled) Transfers: Sit to/from Stand Sit to Stand: Min guard         General transfer comment: cueing for hand placement and safety, increased time and effort      Balance Overall balance assessment: Needs  assistance Sitting-balance support: Feet supported;No upper extremity supported Sitting balance-Leahy Scale: Fair     Standing balance support: Bilateral upper extremity supported;During functional activity Standing balance-Leahy Scale: Poor Standing balance comment: reliant on B UE support                            ADL either performed or assessed with clinical judgement   ADL Overall ADL's : Needs assistance/impaired                 Upper Body Dressing : Minimal assistance;Sitting Upper Body Dressing Details (indicate cue type and reason): daughter assisting,  daughter donning brace with independence Lower Body Dressing: Maximal assistance;Cueing for safety;Cueing for sequencing;Cueing for back precautions Lower Body Dressing Details (indicate cue type and reason): requires max assist, able to assist in pulling pants over hips using 1 handed technique with min guard  Toilet Transfer: Min guard;Ambulation;RW Toilet Transfer Details (indicate cue type and reason): simulated at EOB, plans to use 3:1 over commode at hoem          Functional mobility during ADLs: Min guard;Rolling walker General ADL Comments: Cueing for precaution adherance.  Reviewed safety and daughter present to reinforce.  Will have 24/7 assist initally      Vision       Perception     Praxis      Cognition Arousal/Alertness: Awake/alert Behavior During Therapy: WFL for tasks assessed/performed Overall Cognitive Status: Impaired/Different from baseline Area of Impairment: Memory;Attention                   Current Attention Level: Selective Memory: Decreased recall of precautions;Decreased short-term  memory         General Comments: continues to require cueing for memory and precautions, will have 24/7 support initally at discharge and daughter reports medication affecting         Exercises     Shoulder Instructions       General Comments daughter present and  supportive; educated on skin hygiene with AFO, wearing long sock, assessing pressure points, and not over tightening calf strap     Pertinent Vitals/ Pain       Pain Assessment: Faces Faces Pain Scale: Hurts little more Pain Location: back with movement Pain Descriptors / Indicators: Discomfort;Grimacing;Guarding Pain Intervention(s): Limited activity within patient's tolerance;Repositioned  Home Living                                          Prior Functioning/Environment              Frequency  Min 2X/week        Progress Toward Goals  OT Goals(current goals can now be found in the care plan section)  Progress towards OT goals: Progressing toward goals  Acute Rehab OT Goals Patient Stated Goal: have less pain OT Goal Formulation: With patient/family Time For Goal Achievement: 04/28/18 Potential to Achieve Goals: Fair  Plan Discharge plan remains appropriate;Frequency remains appropriate    Co-evaluation                 AM-PAC OT "6 Clicks" Daily Activity     Outcome Measure   Help from another person eating meals?: None Help from another person taking care of personal grooming?: A Little Help from another person toileting, which includes using toliet, bedpan, or urinal?: A Lot Help from another person bathing (including washing, rinsing, drying)?: A Lot Help from another person to put on and taking off regular upper body clothing?: A Little Help from another person to put on and taking off regular lower body clothing?: A Lot 6 Click Score: 16    End of Session Equipment Utilized During Treatment: Rolling walker;Back brace;Other (comment)(AFO)  OT Visit Diagnosis: Pain;Muscle weakness (generalized) (M62.81);Other abnormalities of gait and mobility (R26.89) Pain - part of body: (back)   Activity Tolerance Patient tolerated treatment well   Patient Left in bed;with call bell/phone within reach;with family/visitor present   Nurse  Communication Mobility status        Time: [id number] OT Time Calculation (min): 29 min  Charges: OT General Charges $OT Visit: 1 Visit OT Treatments $Self Care/Home Management : 23-37 mins  Chancy Milroyhristie S Berkley Cronkright, OT Acute Rehabilitation Services Pager [phone] Office [phone]    Chancy MilroyChristie S Kc Sedlak 04/17/2018, 12:26 PM

## 2018-04-17 NOTE — Progress Notes (Signed)
Reviewed discharge papers and medications ith pt and daughter with full understanding

## 2018-04-17 NOTE — Progress Notes (Signed)
Subjective: Patient stable.  Having some itching with hydrocodone.  Discussed with [name] her nurse she may be somewhat sedated from that as well.  She states the pain is better controlled with the hydrocodone.  She does not tolerate Dilaudid well.  She is walking better with her AFO   Objective: Vital signs in last 24 hours: Temp:  [97.7 F (36.5 C)-98.4 F (36.9 C)] 98.2 F (36.8 C) (01/26 0600) Pulse Rate:  [89-96] 96 (01/26 0600) Resp:  [16-17] 16 (01/26 0600) BP: (147-173)/(67-80) 173/80 (01/26 0600) SpO2:  [95 %-99 %] 95 % (01/26 0600)  Intake/Output from previous day: 01/25 0701 - 01/26 0700 In: 960 [P.O.:960] Out: -  Intake/Output this shift: No intake/output data recorded.  Exam:  Intact pulses distally Compartment soft  Labs: Recent Labs    04/15/18 1240  HGB 8.5*   Recent Labs    04/15/18 1240  WBC 12.2*  RBC 2.64*  HCT 26.3*  PLT 209   Recent Labs    04/15/18 1240  NA 136  K 3.3*  CL 104  CO2 24  BUN 12  CREATININE 0.76  GLUCOSE 182*  CALCIUM 8.6*   No results for input(s): LABPT, INR in the last 72 hours.  Assessment/Plan: Plan at this time is to discontinue the hydrocodone because of the generalized itching and headaches and pruritus that she has with it.  Dose was at 7:00 this morning.  We will try 1 oxycodone at 11:00 and if she tolerates that well she has a handwritten prescription for that medication.  Patient's daughter states they will take the hydrocodone prescription back to the pharmacy.  I think oxycodone every 6 hours may be a better alternative to all of the itching that is being caused by the hydrocodone.  If not then she will have to go with something like tramadol which may not be quite strong enough to handle her pain level.   [name] 04/17/2018, 9:50 AM

## 2018-04-18 ENCOUNTER — Telehealth (INDEPENDENT_AMBULATORY_CARE_PROVIDER_SITE_OTHER): Payer: Self-pay

## 2018-04-18 NOTE — Telephone Encounter (Signed)
Pt had surgery on 1/22 and still has not had a bowel movement. They have been pushing water and fiber. Taken stool softener, tried enema, and laxative today and was only able to do a very small amount. Wants to know what else they can try?

## 2018-04-18 NOTE — Telephone Encounter (Signed)
Please advise 

## 2018-04-19 MED FILL — Heparin Sodium (Porcine) Inj 1000 Unit/ML: INTRAMUSCULAR | Qty: 30 | Status: AC

## 2018-04-19 MED FILL — Sodium Chloride IV Soln 0.9%: INTRAVENOUS | Qty: 1000 | Status: AC

## 2018-04-19 NOTE — Telephone Encounter (Signed)
I called and talked with her daughter and discussed.  She had a small BM last night.  She will cut back on pain medication some.  Having some problems with the AFO which rubs some she will bring it with her.  She can walk using her walker without it currently.

## 2018-04-21 ENCOUNTER — Encounter (INDEPENDENT_AMBULATORY_CARE_PROVIDER_SITE_OTHER): Payer: Self-pay | Admitting: Orthopaedic Surgery

## 2018-04-21 ENCOUNTER — Telehealth (INDEPENDENT_AMBULATORY_CARE_PROVIDER_SITE_OTHER): Payer: Self-pay | Admitting: Radiology

## 2018-04-21 ENCOUNTER — Ambulatory Visit (INDEPENDENT_AMBULATORY_CARE_PROVIDER_SITE_OTHER): Payer: Medicare Other | Admitting: Orthopaedic Surgery

## 2018-04-21 VITALS — Ht 62.0 in | Wt 171.0 lb

## 2018-04-21 DIAGNOSIS — Z981 Arthrodesis status: Secondary | ICD-10-CM

## 2018-04-21 DIAGNOSIS — M532X6 Spinal instabilities, lumbar region: Secondary | ICD-10-CM

## 2018-04-21 NOTE — Telephone Encounter (Signed)
Yes OK

## 2018-04-21 NOTE — Telephone Encounter (Signed)
Patient was in the office for appt today. Medication discussed with patient and daughter.

## 2018-04-21 NOTE — Telephone Encounter (Signed)
[place] D[place], CNA called and states patient is in a lot of pain. She would like to know if they can add tylenol? Please advise.

## 2018-04-21 NOTE — Progress Notes (Signed)
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  Patient's preop right leg pain right thigh weakness is significantly improved.  She is having continued pain on the left L5 nerve root and plain radiograph shows that the left L5 nerve root is a little bit high and the pedicle does enter the vertebral body.  By plain radiograph it does not appear to medial.  We will get a CT scan to evaluate the screw position with her weakness left L5.   Chief Complaint:  Chief Complaint  Patient presents with  . Lower Back - Routine Post Op    04/13/2018 Right L3-4, L4-5 TLIF, Redo Compression, Cages, Pedicle Instrumentation   Visit Diagnoses:  1. Spinal instability of lumbar region   2. S/P lumbar fusion     Plan: On exam patient has significant weakness EHL and 50% decrease strength of her left anterior tib.  Right anterior tib quad is strong.  We reviewed x-rays that show the left L5 screw is high partially above the pedicle enters vertebral body.  This certainly does not appear to medial and is not low in the pedicle.  She has numbness and tingling on the dorsum of her foot with increased sensitivity.  She can up her Lyrica back to her preop dosage of the 200 mg twice a day and will obtain a CT scan lumbar to evaluate the screw position.  I discussed with the that if it appears the screw is causing problems with the nerve then option would be revision of the pedicle screw position and discussed what that would entail.  Follow-up after CT scan.  She got some results with an enema still has problems with constipation and we discussed suppository versus enema gradual decreasing pain medication.  Follow-Up Instructions: No follow-ups on file.   Orders:  Orders Placed  This Encounter  Procedures  . CT LUMBAR SPINE WO CONTRAST   No orders of the defined types were placed in this encounter.   Imaging: No results found.  PMFS History: Patient Active Problem List   Diagnosis Date Noted  . Lumbar stenosis 04/13/2018  . 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  . Lumbar foraminal stenosis 03/24/2018  . Status post lumbar spine surgery for decompression of spinal cord 05/20/2017  . Spinal stenosis of lumbar region with neurogenic claudication 05/10/2017  . Altered mental status 09/24/2015  . Colon cancer screening   . Encounter for screening colonoscopy 04/04/2014  . Lumbago 05/25/2012  . Abdominal pain 07/31/2010  . Diarrhea 07/31/2010  . GERD 11/19/2008  . FATTY LIVER DISEASE 11/19/2008  . Hypothyroidism 11/15/2008  . OBESITY 11/15/2008  . Fibromyalgia 11/15/2008  . Chronic fatigue 11/15/2008  . CHOLELITHIASIS, HX OF 11/15/2008   Past Medical History:  Diagnosis Date  . Anxiety   . Arthritis    hands, back   . Chronic back pain   . Depression   . Diverticulosis   . Fatty liver   . Fibromyalgia   . GERD (gastroesophageal reflux disease) 2006   EGD by Dr. Lovell SheehanJenkins, reports that she has had reflux quite markedly in the past but its improved since taking medicine   . Headache    use  to have migraines when she was younger    . Hemorrhage after delivery of fetus   . Hyperglycemia   . Hypertension    reports that she takes for diuretic   . Hypothyroid   . Pre-diabetes   . S/P colonoscopy 2006   Dr. Lovell Sheehan    Family History  Problem Relation Age of Onset  . Brain cancer Father   . Coronary artery disease Mother   . Prostate cancer Brother   . Cancer Sister        gyn  . Lung cancer Brother   . Seizures Son   . Pancreatitis Daughter        ?gallstones  . Colon cancer Neg Hx     Past Surgical History:  Procedure Laterality Date  . APPENDECTOMY    . BREAST BIOPSY Right   . cataracts Bilateral   . CHOLECYSTECTOMY  1998    . COLONOSCOPY WITH PROPOFOL N/A 04/26/2014   Procedure: COLONOSCOPY WITH PROPOFOL;  Surgeon: Corbin Ade, MD;  Location: [facility];  Service: Endoscopy;  Laterality: N/A;  0939 in cecum, total withdrawal time, 10 min  . LUMBAR DISC SURGERY  1980's  . LUMBAR LAMINECTOMY/DECOMPRESSION MICRODISCECTOMY N/A 05/10/2017   Procedure: L3-4 DECOMPRESSION, RIGHT MICRODISCECTOMY;  Surgeon: [name], MD;  Location: [facility] OR;  Service: Orthopedics;  Laterality: N/A;  . TOTAL ABDOMINAL HYSTERECTOMY W/ BILATERAL SALPINGOOPHORECTOMY     Social History   Occupational History  . 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 Smoking status: Never Smoker  . Smokeless tobacco: Never Used  Substance and Sexual Activity  . Alcohol use: No  . Drug use: No  . Sexual activity: Never

## 2018-04-22 ENCOUNTER — Encounter (INDEPENDENT_AMBULATORY_CARE_PROVIDER_SITE_OTHER): Payer: Self-pay | Admitting: Orthopaedic Surgery

## 2018-04-22 NOTE — Discharge Summary (Signed)
Patient ID: [name] MRN: [id number] DOB/AGE: [birth year] 77 y.o.  Admit date: 04/13/2018 Discharge date: 04/22/2018  Admission Diagnoses:  Active Problems:   Lumbar stenosis   Discharge Diagnoses:  Active Problems:   Lumbar stenosis  status post Procedure(s): RIGHT L3-4, L4-5 TRANSFORAMINAL LUMBAR INTERBODY FUSION, REDO COMPRESSION, CAGES, PEDICLE INSTRUMENTATION  Past Medical History:  Diagnosis Date  . Anxiety   . Arthritis    hands, back   . Chronic back pain   . Depression   . Diverticulosis   . Fatty liver   . Fibromyalgia   . GERD (gastroesophageal reflux disease) 2006   EGD by Dr. Lovell SheehanJenkins, reports that she has had reflux quite markedly in the past but its improved since taking medicine   . Headache    use to have migraines when she was younger    . Hemorrhage after delivery of fetus   . Hyperglycemia   . Hypertension    reports that she takes for diuretic   . Hypothyroid   . Pre-diabetes   . S/P colonoscopy 2006   Dr. Lovell SheehanJenkins    Surgeries: Procedure(s): RIGHT L3-4, L4-5 TRANSFORAMINAL LUMBAR INTERBODY FUSION, REDO COMPRESSION, CAGES, PEDICLE INSTRUMENTATION on 04/13/2018   Consultants:   Discharged Condition: Improved  Hospital Course: [name] is an 78 y.o. female who was admitted 04/13/2018 for operative treatment of lumbar stenosis/hnp. Patient failed conservative treatments (please see the history and physical for the specifics) and had severe unremitting pain that affects sleep, daily activities and work/hobbies. After pre-op clearance, the patient was taken to the operating room on 04/13/2018 and underwent  Procedure(s): RIGHT L3-4, L4-5 TRANSFORAMINAL LUMBAR INTERBODY FUSION, REDO COMPRESSION, CAGES, PEDICLE INSTRUMENTATION.    Patient was given perioperative antibiotics:  Anti-infectives (From admission, onward)   Start     Dose/Rate Route Frequency Ordered Stop   04/14/18 0000  ceFAZolin (ANCEF) IVPB 1 g/50 mL premix     1 g 100 mL/hr  over 30 Minutes Intravenous Every 8 hours 04/13/18 2030 04/14/18 0903   04/13/18 1130  ceFAZolin (ANCEF) IVPB 2g/100 mL premix     2 g 200 mL/hr over 30 Minutes Intravenous On call to O.R. 04/13/18 1037 04/13/18 1700   04/13/18 1056  ceFAZolin (ANCEF) 2-4 GM/100ML-% IVPB    Note to Pharmacy:  Saunders GlanceHartman, Joy   : cabinet override      04/13/18 1056 04/13/18 1300       Patient was given sequential compression devices and early ambulation to prevent DVT.   Patient benefited maximally from hospital stay and there were no complications. At the time of discharge, the patient was urinating/moving their bowels without difficulty, tolerating a regular diet, pain is controlled with oral pain medications and they have been cleared by PT/OT.   Recent vital signs: No data found.   Recent laboratory studies: No results for input(s): WBC, HGB, HCT, PLT, NA, K, CL, CO2, BUN, CREATININE, GLUCOSE, INR, CALCIUM in the last 72 hours.  Invalid input(s): PT, 2   Discharge Medications:   Allergies as of 04/17/2018      Reactions   Sulfonamide Derivatives Shortness Of Breath, Rash   Rash on chest   Adhesive [tape]    Creates skin tears      Medication List    STOP taking these medications   acetaminophen 325 MG tablet Commonly known as:  TYLENOL   diphenhydrAMINE 25 MG tablet Commonly known as:  BENADRYL   diphenhydramine-acetaminophen 25-500 MG Tabs tablet Commonly known as:  TYLENOL  PM   Melatonin 10 MG Tabs   meloxicam 15 MG tablet Commonly known as:  MOBIC   multivitamin capsule     TAKE these medications   aspirin 81 MG tablet Take 81 mg by mouth at bedtime.   ciprofloxacin 500 MG tablet Commonly known as:  CIPRO Take 1 tablet (500 mg total) by mouth 2 (two) times daily.   DULoxetine 60 MG capsule Commonly known as:  CYMBALTA Take 60 mg by mouth daily before breakfast.   fenofibrate 145 MG tablet Commonly known as:  TRICOR Take 145 mg by mouth daily.   hydrochlorothiazide  12.5 MG capsule Commonly known as:  MICROZIDE Take 12.5 mg by mouth daily.   HYDROcodone-acetaminophen 7.5-325 MG tablet Commonly known as:  NORCO Take 1-2 tablets by mouth every 6 (six) hours as needed for up to 7 days for moderate pain. Post op pain What changed:    how much to take  when to take this  reasons to take this  additional instructions   levothyroxine 100 MCG tablet Commonly known as:  SYNTHROID, LEVOTHROID Take 100 mcg by mouth daily before breakfast.   Lidocaine 4 % Ptch Apply 1 patch topically daily as needed (pain).   omeprazole 20 MG capsule Commonly known as:  PRILOSEC Take 20 mg by mouth daily.   pregabalin 100 MG capsule Commonly known as:  LYRICA Take 1 capsule (100 mg total) by mouth 2 (two) times daily. What changed:    medication strength  how much to take  when to take this       Diagnostic Studies: Dg Chest 2 View  Result Date: 04/13/2018 CLINICAL DATA:  Preoperative evaluation for back surgery, history GERD, hypertension EXAM: CHEST - 2 VIEW COMPARISON:  05/04/2017 FINDINGS: Normal heart size, mediastinal contours, and pulmonary vascularity. Atherosclerotic calcification aorta. Lungs clear. No infiltrate, pleural effusion or pneumothorax. Bones unremarkable. IMPRESSION: No acute abnormalities. Electronically Signed   By: Ulyses SouthwardMark  Boles M.D.   On: 04/13/2018 11:02   Dg Lumbar Spine 2-3 Views  Result Date: 04/13/2018 CLINICAL DATA:  L3-L5 posterior fusion EXAM: LUMBAR SPINE - 2-3 VIEW; DG C-ARM 61-120 MIN COMPARISON:  03/17/2018, 05/10/2017 FINDINGS: Spot fluoroscopic intraoperative views demonstrate bipedicular screw and rod fixation posteriorly spanning L3-L5. Disc spacers noted at both levels. Stable alignment. No complicating feature. Endplate degenerative changes noted. IMPRESSION: Status post L3-L5 posterior fusion. Anatomic alignment. No complicating feature by plain radiography. Electronically Signed   By: Judie PetitM.  Shick M.D.   On: 04/13/2018  19:16   Dg Lumbar Spine Complete  Result Date: 04/15/2018 CLINICAL DATA:  Fall.  Recent surgery. EXAM: LUMBAR SPINE - COMPLETE 4+ VIEW COMPARISON:  04/13/2018 FINDINGS: Pedicle screw and interbody fusion L3-4 L4-5 unchanged from recent operative images. Left L5 pedicle screw may be superior to the pedicle. Mild retrolisthesis L2-3. Mild anterolisthesis L3-4 and L4-5. Moderate disc degeneration L5-S1 Negative for fracture. Atherosclerotic aorta. IMPRESSION: Negative for lumbar fracture Recent pedicle screw interbody fusion L3-4 and L4-5. Left L5 screw may project above the pedicle. Electronically Signed   By: Marlan Palauharles  Clark M.D.   On: 04/15/2018 10:32   Dg C-arm 1-60 Min  Result Date: 04/13/2018 CLINICAL DATA:  L3-L5 posterior fusion EXAM: LUMBAR SPINE - 2-3 VIEW; DG C-ARM 61-120 MIN COMPARISON:  03/17/2018, 05/10/2017 FINDINGS: Spot fluoroscopic intraoperative views demonstrate bipedicular screw and rod fixation posteriorly spanning L3-L5. Disc spacers noted at both levels. Stable alignment. No complicating feature. Endplate degenerative changes noted. IMPRESSION: Status post L3-L5 posterior fusion. Anatomic alignment. No complicating  feature by plain radiography. Electronically Signed   By: Judie Petit.  Shick M.D.   On: 04/13/2018 19:16    Discharge Instructions    Call MD / Call 911   Complete by:  As directed    If you experience chest pain or shortness of breath, CALL 911 and be transported to the hospital emergency room.  If you develope a fever above 101 F, pus (white drainage) or increased drainage or redness at the wound, or calf pain, call your surgeon's office.   Constipation Prevention   Complete by:  As directed    Drink plenty of fluids.  Prune juice may be helpful.  You may use a stool softener, such as Colace (over the counter) 100 mg twice a day.  Use MiraLax (over the counter) for constipation as needed.   Diet - low sodium heart healthy   Complete by:  As directed    Increase activity  slowly as tolerated   Complete by:  As directed       Follow-up Information    [name], MD Follow up in 1 week(s).   Specialty:  Orthopedic Surgery Contact information: [address] [place] [address] [phone]           Discharge Plan:  discharge to home  Disposition:     Signed: Zonia Kief  04/22/2018, 3:49 PM

## 2018-04-25 ENCOUNTER — Other Ambulatory Visit (INDEPENDENT_AMBULATORY_CARE_PROVIDER_SITE_OTHER): Payer: Self-pay | Admitting: Orthopaedic Surgery

## 2018-04-25 ENCOUNTER — Telehealth (INDEPENDENT_AMBULATORY_CARE_PROVIDER_SITE_OTHER): Payer: Self-pay | Admitting: Orthopaedic Surgery

## 2018-04-25 MED ORDER — OXYCODONE-ACETAMINOPHEN 5-325 MG PO TABS: 1.0000 | ORAL_TABLET | Freq: Four times a day (QID) | ORAL | 0 refills | Status: DC | PRN

## 2018-04-25 NOTE — Telephone Encounter (Signed)
Patient daughter Eileen Stanford called stated Ophelia Charter told them at her f/u appt 1/30 in [place] that he would call in pain medication. Patient needs Oxycoodone  Because of an allergic reaction to the Hydrocodone.  Please call script into [facility] in [place] [phone]   Please call daughter Eileen Stanford [phone]

## 2018-04-25 NOTE — Telephone Encounter (Signed)
Patient's daughter Eileen Stanford called regarding Rx refill for Oxycodone that was to be called to [facility] . Eileen Stanford has spoken with the pharmacy and they do not have any refills. Patient will be out of medication by 6pm tonight. Jenna's call back # [phone]

## 2018-04-25 NOTE — Telephone Encounter (Signed)
Patient's husband Dennard Nip) called advised patient need Rx refilled (Oxycodone) 5 mg. Dennard Nip said she will be out of her medication today at 5:00pm. Dennard Nip asked if the Rx can be sent to the [facility] in [place] if it can be escribed. The number to contact Dennard Nip is [phone]

## 2018-04-25 NOTE — Telephone Encounter (Signed)
noted 

## 2018-04-25 NOTE — Telephone Encounter (Signed)
Duplicate in chart. Dr. Ophelia CharterYates has advised.

## 2018-04-25 NOTE — Telephone Encounter (Signed)
Message has been sent to Dr. Ophelia Charter in Epic and by text. He is in surgery today and will answer messages as available. Claris Che explained this to the patient. Patient's daughter, Eileen Stanford, also called [place] office regarding Rx. They are very concerned Ms. Inwood will not get her medication today.  Claris Gower explained to [place] that I have sent messages to Dr. Ophelia Charter and will get a response to them as soon as possible.

## 2018-04-25 NOTE — Telephone Encounter (Signed)
Please see below and advise. Patient will be out of pain medication this evening.

## 2018-04-25 NOTE — Telephone Encounter (Signed)
I called, done , notified. thanksFYI

## 2018-04-25 NOTE — Telephone Encounter (Signed)
Duplicate message in chart. Previous message has been sent to Dr. Ophelia Charter to advise.

## 2018-04-25 NOTE — Telephone Encounter (Signed)
Message sent in error

## 2018-04-26 ENCOUNTER — Ambulatory Visit (HOSPITAL_COMMUNITY)
Admission: RE | Admit: 2018-04-26 | Discharge: 2018-04-26 | Disposition: A | Discharge status: Home / Self Care | Payer: Medicare Other | Source: Ambulatory Visit | Attending: Orthopaedic Surgery | Admitting: Orthopaedic Surgery

## 2018-04-26 DIAGNOSIS — M532X6 Spinal instabilities, lumbar region: Secondary | ICD-10-CM | POA: Diagnosis present

## 2018-04-27 ENCOUNTER — Telehealth (INDEPENDENT_AMBULATORY_CARE_PROVIDER_SITE_OTHER): Payer: Self-pay | Admitting: Radiology

## 2018-04-27 NOTE — Telephone Encounter (Signed)
Patient appt made for 05/12/18 at 2pm.  Patient's husband is aware.

## 2018-04-27 NOTE — Telephone Encounter (Signed)
I called discussed. ROV in  2 wks thanks

## 2018-04-27 NOTE — Telephone Encounter (Signed)
Patient called and had CT of Lumbar Spine yesterday. The results are available in Epic. Would you like for patient to come in to review?  CB for patient [phone]

## 2018-05-02 ENCOUNTER — Other Ambulatory Visit (INDEPENDENT_AMBULATORY_CARE_PROVIDER_SITE_OTHER): Payer: Self-pay | Admitting: Orthopaedic Surgery

## 2018-05-02 ENCOUNTER — Ambulatory Visit (HOSPITAL_COMMUNITY): Payer: Medicare Other

## 2018-05-02 NOTE — Telephone Encounter (Signed)
Could you please advise since Dr. Yates is out of the office? 

## 2018-05-12 ENCOUNTER — Ambulatory Visit (INDEPENDENT_AMBULATORY_CARE_PROVIDER_SITE_OTHER): Payer: Medicare Other | Admitting: Orthopaedic Surgery

## 2018-05-12 ENCOUNTER — Encounter (INDEPENDENT_AMBULATORY_CARE_PROVIDER_SITE_OTHER): Payer: Self-pay | Admitting: Orthopaedic Surgery

## 2018-05-12 VITALS — BP 168/91 | HR 85 | Ht 62.0 in | Wt 171.0 lb

## 2018-05-12 DIAGNOSIS — Z9889 Other specified postprocedural states: Secondary | ICD-10-CM

## 2018-05-12 MED ORDER — OXYCODONE-ACETAMINOPHEN 5-325 MG PO TABS: 1.0000 | ORAL_TABLET | Freq: Four times a day (QID) | ORAL | 0 refills | Status: DC | PRN

## 2018-05-13 NOTE — Progress Notes (Signed)
Post-Op Visit Note   Patient: [name]           Date of Birth: 06/09/1940           MRN: [id number] Visit Date: 05/12/2018 PCP: Benita Stabile, MD   Assessment & Plan: Follow-up 2 levels lumbar fusion L3-4 L4 5T lift.  CT scan postop shows no compression on the left consistent with her left foot drop.  She is cut back on her oxycodone is still taking Lyrica.  CT scan was reviewed.  Oxycodone renewed.  She should get gradual improvement in her left ankle dorsiflexion with time.  Continue use of her walker to avoid falling.  Oxycodone renewed.  Recheck 4 weeks.  Chief Complaint:  Chief Complaint  Patient presents with  . Lower Back - Follow-up    04/13/2018 Right L3-4, L4-5 TLIF, redo compression, cages, pedicle instrumentation   Visit Diagnoses:  1. Status post lumbar spine surgery for decompression of spinal cord     Plan: Incision looks good continue walking daily.  Right leg pain present preop is resolved but she has left leg burning with the partial foot drop.  She had a carbon fiber brace that rubbed against her ankle she could not even wear 1 day.  She needs a standard AFO off-the-shelf with available that she can use temporarily for the next month or so.  Plan to recheck her in 4 weeks.  Follow-Up Instructions: Return in about 4 weeks (around 06/09/2018).   Orders:  No orders of the defined types were placed in this encounter.  Meds ordered this encounter  Medications  . oxyCODONE-acetaminophen (PERCOCET/ROXICET) 5-325 MG tablet    Sig: Take 1 tablet by mouth every 6 (six) hours as needed for severe pain.    Dispense:  30 tablet    Refill:  0    Imaging: No results found.  PMFS History: Patient Active Problem List   Diagnosis Date Noted  . Lumbar stenosis 04/13/2018  . 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  . Lumbar foraminal stenosis 03/24/2018  . Status post lumbar spine surgery for decompression of spinal cord 05/20/2017  . Altered mental status  09/24/2015  . Colon cancer screening   . Encounter for screening colonoscopy 04/04/2014  . Lumbago 05/25/2012  . Abdominal pain 07/31/2010  . Diarrhea 07/31/2010  . GERD 11/19/2008  . FATTY LIVER DISEASE 11/19/2008  . Hypothyroidism 11/15/2008  . OBESITY 11/15/2008  . Fibromyalgia 11/15/2008  . Chronic fatigue 11/15/2008  . CHOLELITHIASIS, HX OF 11/15/2008   Past Medical History:  Diagnosis Date  . Anxiety   . Arthritis    hands, back   . Chronic back pain   . Depression   . Diverticulosis   . Fatty liver   . Fibromyalgia   . GERD (gastroesophageal reflux disease) 2006   EGD by Dr. Lovell Sheehan, reports that she has had reflux quite markedly in the past but its improved since taking medicine   . Headache    use to have migraines when she was younger    . Hemorrhage after delivery of fetus   . Hyperglycemia   . Hypertension    reports that she takes for diuretic   . Hypothyroid   . Pre-diabetes   . S/P colonoscopy 2006   Dr. Lovell Sheehan    Family History  Problem Relation Age of Onset  . Brain cancer Father   . Coronary artery disease Mother   . Prostate cancer Brother   . Cancer Sister  gyn  . Lung cancer Brother   . Seizures Son   . Pancreatitis Daughter        ?gallstones  . Colon cancer Neg Hx     Past Surgical History:  Procedure Laterality Date  . APPENDECTOMY    . BREAST BIOPSY Right   . cataracts Bilateral   . CHOLECYSTECTOMY  1998  . COLONOSCOPY WITH PROPOFOL N/A 04/26/2014   Procedure: COLONOSCOPY WITH PROPOFOL;  Surgeon: Corbin Ade, MD;  Location: [facility];  Service: Endoscopy;  Laterality: N/A;  0939 in cecum, total withdrawal time, 10 min  . LUMBAR DISC SURGERY  1980's  . LUMBAR LAMINECTOMY/DECOMPRESSION MICRODISCECTOMY N/A 05/10/2017   Procedure: L3-4 DECOMPRESSION, RIGHT MICRODISCECTOMY;  Surgeon: [name], MD;  Location: [facility] OR;  Service: Orthopedics;  Laterality: N/A;  . TOTAL ABDOMINAL HYSTERECTOMY W/ BILATERAL SALPINGOOPHORECTOMY      Social History   Occupational History  . 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 Smoking status: Never Smoker  . Smokeless tobacco: Never Used  Substance and Sexual Activity  . Alcohol use: No  . Drug use: No  . Sexual activity: Never

## 2018-06-09 ENCOUNTER — Ambulatory Visit (INDEPENDENT_AMBULATORY_CARE_PROVIDER_SITE_OTHER): Payer: Medicare Other | Admitting: Orthopaedic Surgery

## 2018-06-09 ENCOUNTER — Encounter (INDEPENDENT_AMBULATORY_CARE_PROVIDER_SITE_OTHER): Payer: Self-pay | Admitting: Orthopaedic Surgery

## 2018-06-09 ENCOUNTER — Other Ambulatory Visit: Payer: Self-pay

## 2018-06-09 VITALS — Ht 62.0 in | Wt 171.0 lb

## 2018-06-09 DIAGNOSIS — Z9889 Other specified postprocedural states: Secondary | ICD-10-CM

## 2018-06-09 NOTE — Progress Notes (Signed)
Post-Op Visit Note   Patient: [name]           Date of Birth: 04/13/1940           MRN: [id number] Visit Date: 06/09/2018 PCP: Benita Stabile, MD   Assessment & Plan: Post L3-4, L4-5 right T lift with some postop left partial foot drop.  There is been a delay in getting insurance approval for AFO.  We looked at some options on [place] and she states she like to pursue this since she will only be using it for a month or 2.  She states her back is feeling better she still using her brace.  Right leg feels good.  Left leg is getting better week to week slowly.  I will recheck her in 2 months.  Chief Complaint:  Chief Complaint  Patient presents with  . Lower Back - Follow-up    04/13/2018 Right L3-4, L4-5 TLIF, Redo Compression, Cages, Pedicle Instrumentation   Visit Diagnoses:  1. Status post lumbar spine surgery for decompression of spinal cord     Plan: Return 2 months repeat [facility] lateral lumbar x-ray on return.  Follow-Up Instructions: Return in about 2 months (around 08/09/2018).   Orders:  No orders of the defined types were placed in this encounter.  No orders of the defined types were placed in this encounter.   Imaging: No results found.  PMFS History: Patient Active Problem List   Diagnosis Date Noted  . Lumbar stenosis 04/13/2018  . 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  . Lumbar foraminal stenosis 03/24/2018  . Status post lumbar spine surgery for decompression of spinal cord 05/20/2017  . Altered mental status 09/24/2015  . Colon cancer screening   . Encounter for screening colonoscopy 04/04/2014  . Lumbago 05/25/2012  . Abdominal pain 07/31/2010  . Diarrhea 07/31/2010  . GERD 11/19/2008  . FATTY LIVER DISEASE 11/19/2008  . Hypothyroidism 11/15/2008  . OBESITY 11/15/2008  . Fibromyalgia 11/15/2008  . Chronic fatigue 11/15/2008  . CHOLELITHIASIS, HX OF 11/15/2008   Past Medical History:  Diagnosis Date  . Anxiety   . Arthritis    hands,  back   . Chronic back pain   . Depression   . Diverticulosis   . Fatty liver   . Fibromyalgia   . GERD (gastroesophageal reflux disease) 2006   EGD by Dr. Lovell Sheehan, reports that she has had reflux quite markedly in the past but its improved since taking medicine   . Headache    use to have migraines when she was younger    . Hemorrhage after delivery of fetus   . Hyperglycemia   . Hypertension    reports that she takes for diuretic   . Hypothyroid   . Pre-diabetes   . S/P colonoscopy 2006   Dr. Lovell Sheehan    Family History  Problem Relation Age of Onset  . Brain cancer Father   . Coronary artery disease Mother   . Prostate cancer Brother   . Cancer Sister        gyn  . Lung cancer Brother   . Seizures Son   . Pancreatitis Daughter        ?gallstones  . Colon cancer Neg Hx     Past Surgical History:  Procedure Laterality Date  . APPENDECTOMY    . BREAST BIOPSY Right   . cataracts Bilateral   . CHOLECYSTECTOMY  1998  . COLONOSCOPY WITH PROPOFOL N/A 04/26/2014   Procedure: COLONOSCOPY WITH PROPOFOL;  Surgeon: Corbin Ade, MD;  Location: [facility];  Service: Endoscopy;  Laterality: N/A;  0939 in cecum, total withdrawal time, 10 min  . LUMBAR DISC SURGERY  1980's  . LUMBAR LAMINECTOMY/DECOMPRESSION MICRODISCECTOMY N/A 05/10/2017   Procedure: L3-4 DECOMPRESSION, RIGHT MICRODISCECTOMY;  Surgeon: [name], MD;  Location: [facility] OR;  Service: Orthopedics;  Laterality: N/A;  . TOTAL ABDOMINAL HYSTERECTOMY W/ BILATERAL SALPINGOOPHORECTOMY     Social History   Occupational History  . 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 Smoking status: Never Smoker  . Smokeless tobacco: Never Used  Substance and Sexual Activity  . Alcohol use: No  . Drug use: No  . Sexual activity: Never

## 2018-08-04 ENCOUNTER — Ambulatory Visit (INDEPENDENT_AMBULATORY_CARE_PROVIDER_SITE_OTHER): Payer: Medicare Other | Admitting: Orthopaedic Surgery

## 2018-08-04 ENCOUNTER — Encounter: Payer: Self-pay | Admitting: Orthopaedic Surgery

## 2018-08-04 ENCOUNTER — Other Ambulatory Visit: Payer: Self-pay

## 2018-08-04 VITALS — Ht 62.0 in | Wt 170.0 lb

## 2018-08-04 DIAGNOSIS — Z981 Arthrodesis status: Secondary | ICD-10-CM | POA: Diagnosis not present

## 2018-08-04 DIAGNOSIS — Z9889 Other specified postprocedural states: Secondary | ICD-10-CM

## 2018-08-04 NOTE — Progress Notes (Signed)
Office Visit Note   Patient: [name]           Date of Birth: Sep 27, 1940           MRN: [id number] Visit Date: 08/04/2018              Requested by: Benita StabileHall, John Z, MD [address] [place], [place] [id number] PCP: Benita StabileHall, John Z, MD   Assessment & Plan: Visit Diagnoses:  1. Status post lumbar spine surgery for decompression of spinal cord   2. S/P lumbar fusion     Plan: Patient can discontinue her lumbar brace.  Patient will continue to work on increased ambulation distance.  She can recheck with me for exam in  12 months.  Appropriate shoe wear was discussed.  She will look for shoes that have more of double rocker type shape which will help her with ambulation.  She will call if she has some increased symptoms.  Follow-Up Instructions: Return in about 1 year (around 08/04/2019).   Orders:  No orders of the defined types were placed in this encounter.  No orders of the defined types were placed in this encounter.     Procedures: No procedures performed   Clinical Data: No additional findings.   Subjective: Chief Complaint  Patient presents with  . Lower Back - Follow-up    04/13/2018 Right L3-4, L4-5 TLIF, Redo Compression, Cages, Pedicle Instrumentation    HPI 78 year old female returns post L3-4, L4-5 2 levels fusion with instrumentation on 04/13/2018.  Postop she had some left foot drop initially used a temporary brace now no longer uses a brace.  She has some shoes that has mild rocker-bottom formation with wide flange on the heel.  She is ambulating better and is using a cane and states her left leg pain is improved some but she still has weakness in ankle dorsiflexion.  Follow-up CT scan showed no evidence of residual nerve compression.  Preop severe spinal stenosis symptoms have been relieved.  Review of Systems Updated unchanged from January surgery other than as mentioned in HPI.  Objective: Vital Signs: Ht 5\' 2"  (1.575 m)   Wt 170 lb (77.1 kg)   BMI  31.09 kg/m   Physical Exam Constitutional:      Appearance: She is well-developed.  HENT:     Head: Normocephalic.     Right Ear: External ear normal.     Left Ear: External ear normal.  Eyes:     Pupils: Pupils are equal, round, and reactive to light.  Neck:     Thyroid: No thyromegaly.     Trachea: No tracheal deviation.  Cardiovascular:     Rate and Rhythm: Normal rate.  Pulmonary:     Effort: Pulmonary effort is normal.  Abdominal:     Palpations: Abdomen is soft.  Skin:    General: Skin is warm and dry.  Neurological:     Mental Status: She is alert and oriented to person, place, and time.  Psychiatric:        Behavior: Behavior normal.     Ortho Exam well-healed lumbar incision.  Patient is ambulatory with a cane.  Patient still has some weakness of anterior tib on the left side only but it is improved from March exam.  She is not catching her toe with ambulation.  Specialty Comments:  No specialty comments available.  Imaging: No results found.   PMFS History: Patient Active Problem List   Diagnosis Date Noted  .  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  . Lumbar foraminal stenosis 03/24/2018  . S/P lumbar fusion 05/20/2017  . Altered mental status 09/24/2015  . Colon cancer screening   . Encounter for screening colonoscopy 04/04/2014  . Lumbago 05/25/2012  . Abdominal pain 07/31/2010  . Diarrhea 07/31/2010  . GERD 11/19/2008  . FATTY LIVER DISEASE 11/19/2008  . Hypothyroidism 11/15/2008  . OBESITY 11/15/2008  . Fibromyalgia 11/15/2008  . Chronic fatigue 11/15/2008  . CHOLELITHIASIS, HX OF 11/15/2008   Past Medical History:  Diagnosis Date  . Anxiety   . Arthritis    hands, back   . Chronic back pain   . Depression   . Diverticulosis   . Fatty liver   . Fibromyalgia   . GERD (gastroesophageal reflux disease) 2006   EGD by Dr. Lovell Sheehan, reports that she has had reflux quite markedly in the past but its improved since taking medicine    . Headache    use to have migraines when she was younger    . Hemorrhage after delivery of fetus   . Hyperglycemia   . Hypertension    reports that she takes for diuretic   . Hypothyroid   . Pre-diabetes   . S/P colonoscopy 2006   Dr. Lovell Sheehan    Family History  Problem Relation Age of Onset  . Brain cancer Father   . Coronary artery disease Mother   . Prostate cancer Brother   . Cancer Sister        gyn  . Lung cancer Brother   . Seizures Son   . Pancreatitis Daughter        ?gallstones  . Colon cancer Neg Hx     Past Surgical History:  Procedure Laterality Date  . APPENDECTOMY    . BREAST BIOPSY Right   . cataracts Bilateral   . CHOLECYSTECTOMY  1998  . COLONOSCOPY WITH PROPOFOL N/A 04/26/2014   Procedure: COLONOSCOPY WITH PROPOFOL;  Surgeon: Corbin Ade, MD;  Location: [facility];  Service: Endoscopy;  Laterality: N/A;  0939 in cecum, total withdrawal time, 10 min  . LUMBAR DISC SURGERY  1980's  . LUMBAR LAMINECTOMY/DECOMPRESSION MICRODISCECTOMY N/A 05/10/2017   Procedure: L3-4 DECOMPRESSION, RIGHT MICRODISCECTOMY;  Surgeon: [name], MD;  Location: [facility];  Service: Orthopedics;  Laterality: N/A;  . TOTAL ABDOMINAL HYSTERECTOMY W/ BILATERAL SALPINGOOPHORECTOMY     Social History   Occupational History  . 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 Smoking status: Never Smoker  . Smokeless tobacco: Never Used  Substance and Sexual Activity  . Alcohol use: No  . Drug use: No  . Sexual activity: Never

## 2018-12-15 ENCOUNTER — Other Ambulatory Visit (HOSPITAL_COMMUNITY): Payer: Self-pay | Admitting: Internal Medicine

## 2018-12-15 DIAGNOSIS — Z1231 Encounter for screening mammogram for malignant neoplasm of breast: Secondary | ICD-10-CM

## 2019-01-23 ENCOUNTER — Ambulatory Visit (HOSPITAL_COMMUNITY)
Admission: RE | Admit: 2019-01-23 | Discharge: 2019-01-23 | Disposition: A | Discharge status: Home / Self Care | Payer: Medicare Other | Source: Ambulatory Visit | Attending: Internal Medicine | Admitting: Internal Medicine

## 2019-01-23 ENCOUNTER — Other Ambulatory Visit: Payer: Self-pay

## 2019-01-23 DIAGNOSIS — Z1231 Encounter for screening mammogram for malignant neoplasm of breast: Secondary | ICD-10-CM | POA: Diagnosis not present

## 2019-06-30 DIAGNOSIS — F330 Major depressive disorder, recurrent, mild: Secondary | ICD-10-CM | POA: Diagnosis not present

## 2019-06-30 DIAGNOSIS — E039 Hypothyroidism, unspecified: Secondary | ICD-10-CM | POA: Diagnosis not present

## 2019-06-30 DIAGNOSIS — F39 Unspecified mood [affective] disorder: Secondary | ICD-10-CM | POA: Diagnosis not present

## 2019-06-30 DIAGNOSIS — F5101 Primary insomnia: Secondary | ICD-10-CM | POA: Diagnosis not present

## 2019-06-30 DIAGNOSIS — F419 Anxiety disorder, unspecified: Secondary | ICD-10-CM | POA: Diagnosis not present

## 2019-06-30 DIAGNOSIS — Z0000 Encounter for general adult medical examination without abnormal findings: Secondary | ICD-10-CM | POA: Diagnosis not present

## 2019-06-30 DIAGNOSIS — E1122 Type 2 diabetes mellitus with diabetic chronic kidney disease: Secondary | ICD-10-CM | POA: Diagnosis not present

## 2019-06-30 DIAGNOSIS — E782 Mixed hyperlipidemia: Secondary | ICD-10-CM | POA: Diagnosis not present

## 2019-06-30 DIAGNOSIS — E119 Type 2 diabetes mellitus without complications: Secondary | ICD-10-CM | POA: Diagnosis not present

## 2019-07-04 DIAGNOSIS — E119 Type 2 diabetes mellitus without complications: Secondary | ICD-10-CM | POA: Diagnosis not present

## 2019-07-04 DIAGNOSIS — H524 Presbyopia: Secondary | ICD-10-CM | POA: Diagnosis not present

## 2019-07-04 DIAGNOSIS — Z961 Presence of intraocular lens: Secondary | ICD-10-CM | POA: Diagnosis not present

## 2019-07-05 DIAGNOSIS — I10 Essential (primary) hypertension: Secondary | ICD-10-CM | POA: Diagnosis not present

## 2019-07-05 DIAGNOSIS — E1122 Type 2 diabetes mellitus with diabetic chronic kidney disease: Secondary | ICD-10-CM | POA: Diagnosis not present

## 2019-07-05 DIAGNOSIS — M5117 Intervertebral disc disorders with radiculopathy, lumbosacral region: Secondary | ICD-10-CM | POA: Diagnosis not present

## 2019-07-05 DIAGNOSIS — E039 Hypothyroidism, unspecified: Secondary | ICD-10-CM | POA: Diagnosis not present

## 2019-07-05 DIAGNOSIS — F330 Major depressive disorder, recurrent, mild: Secondary | ICD-10-CM | POA: Diagnosis not present

## 2019-07-05 DIAGNOSIS — E782 Mixed hyperlipidemia: Secondary | ICD-10-CM | POA: Diagnosis not present

## 2019-07-05 DIAGNOSIS — M797 Fibromyalgia: Secondary | ICD-10-CM | POA: Diagnosis not present

## 2019-07-05 DIAGNOSIS — N182 Chronic kidney disease, stage 2 (mild): Secondary | ICD-10-CM | POA: Diagnosis not present

## 2019-07-05 DIAGNOSIS — F5101 Primary insomnia: Secondary | ICD-10-CM | POA: Diagnosis not present

## 2019-08-28 ENCOUNTER — Other Ambulatory Visit: Payer: Self-pay

## 2019-08-28 ENCOUNTER — Encounter: Payer: Self-pay | Admitting: Podiatry

## 2019-08-28 ENCOUNTER — Ambulatory Visit: Payer: Medicare PPO | Admitting: Podiatry

## 2019-08-28 DIAGNOSIS — L97522 Non-pressure chronic ulcer of other part of left foot with fat layer exposed: Secondary | ICD-10-CM

## 2019-08-28 DIAGNOSIS — M21372 Foot drop, left foot: Secondary | ICD-10-CM | POA: Diagnosis not present

## 2019-08-28 IMAGING — CR DG CHEST 2V
2 series · 2 of 2 positions shown · non-contrast
Comparison: 09/24/2015

CLINICAL DATA: Preoperative evaluation for lumbar surgery

EXAM:
CHEST  2 VIEW

[w chest pa]
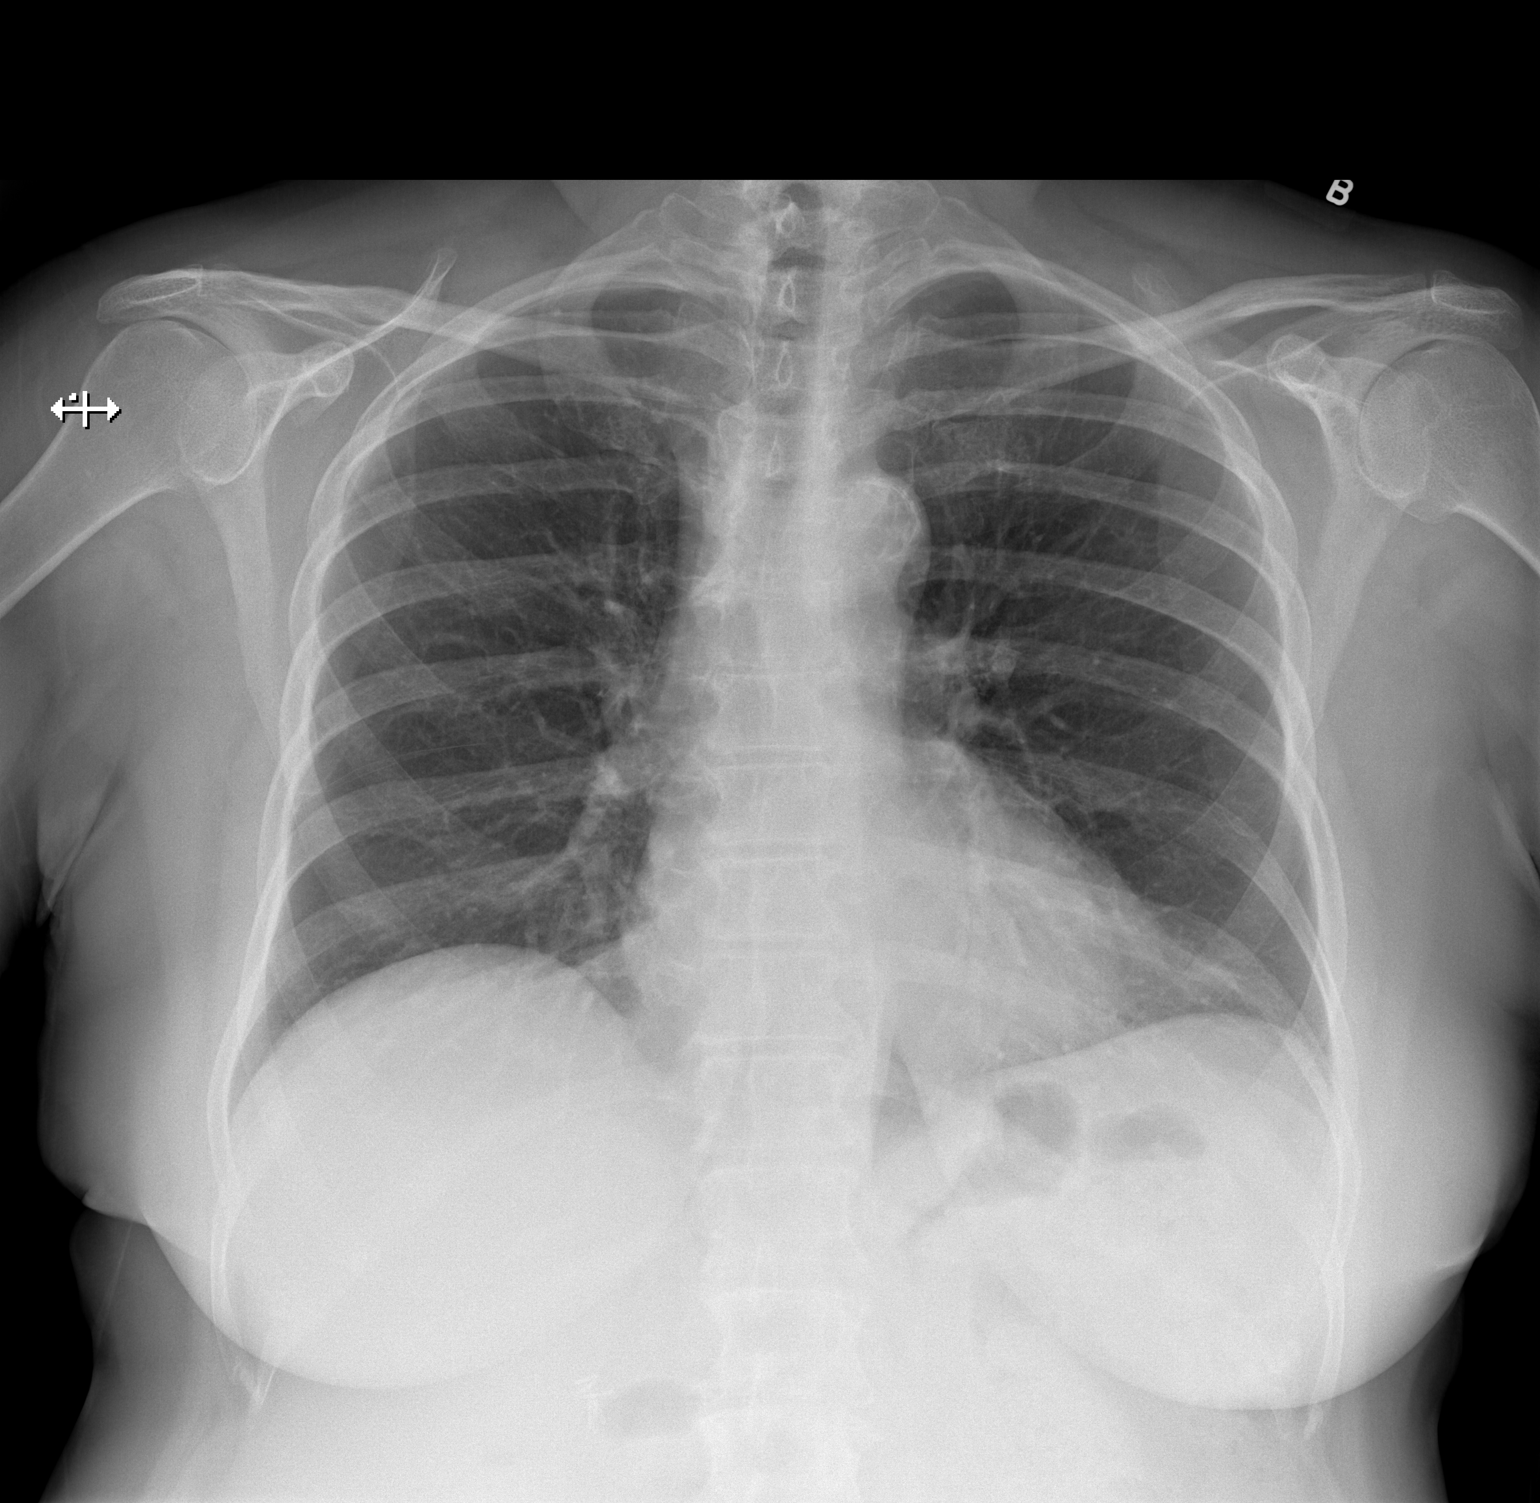

[w chest lat]
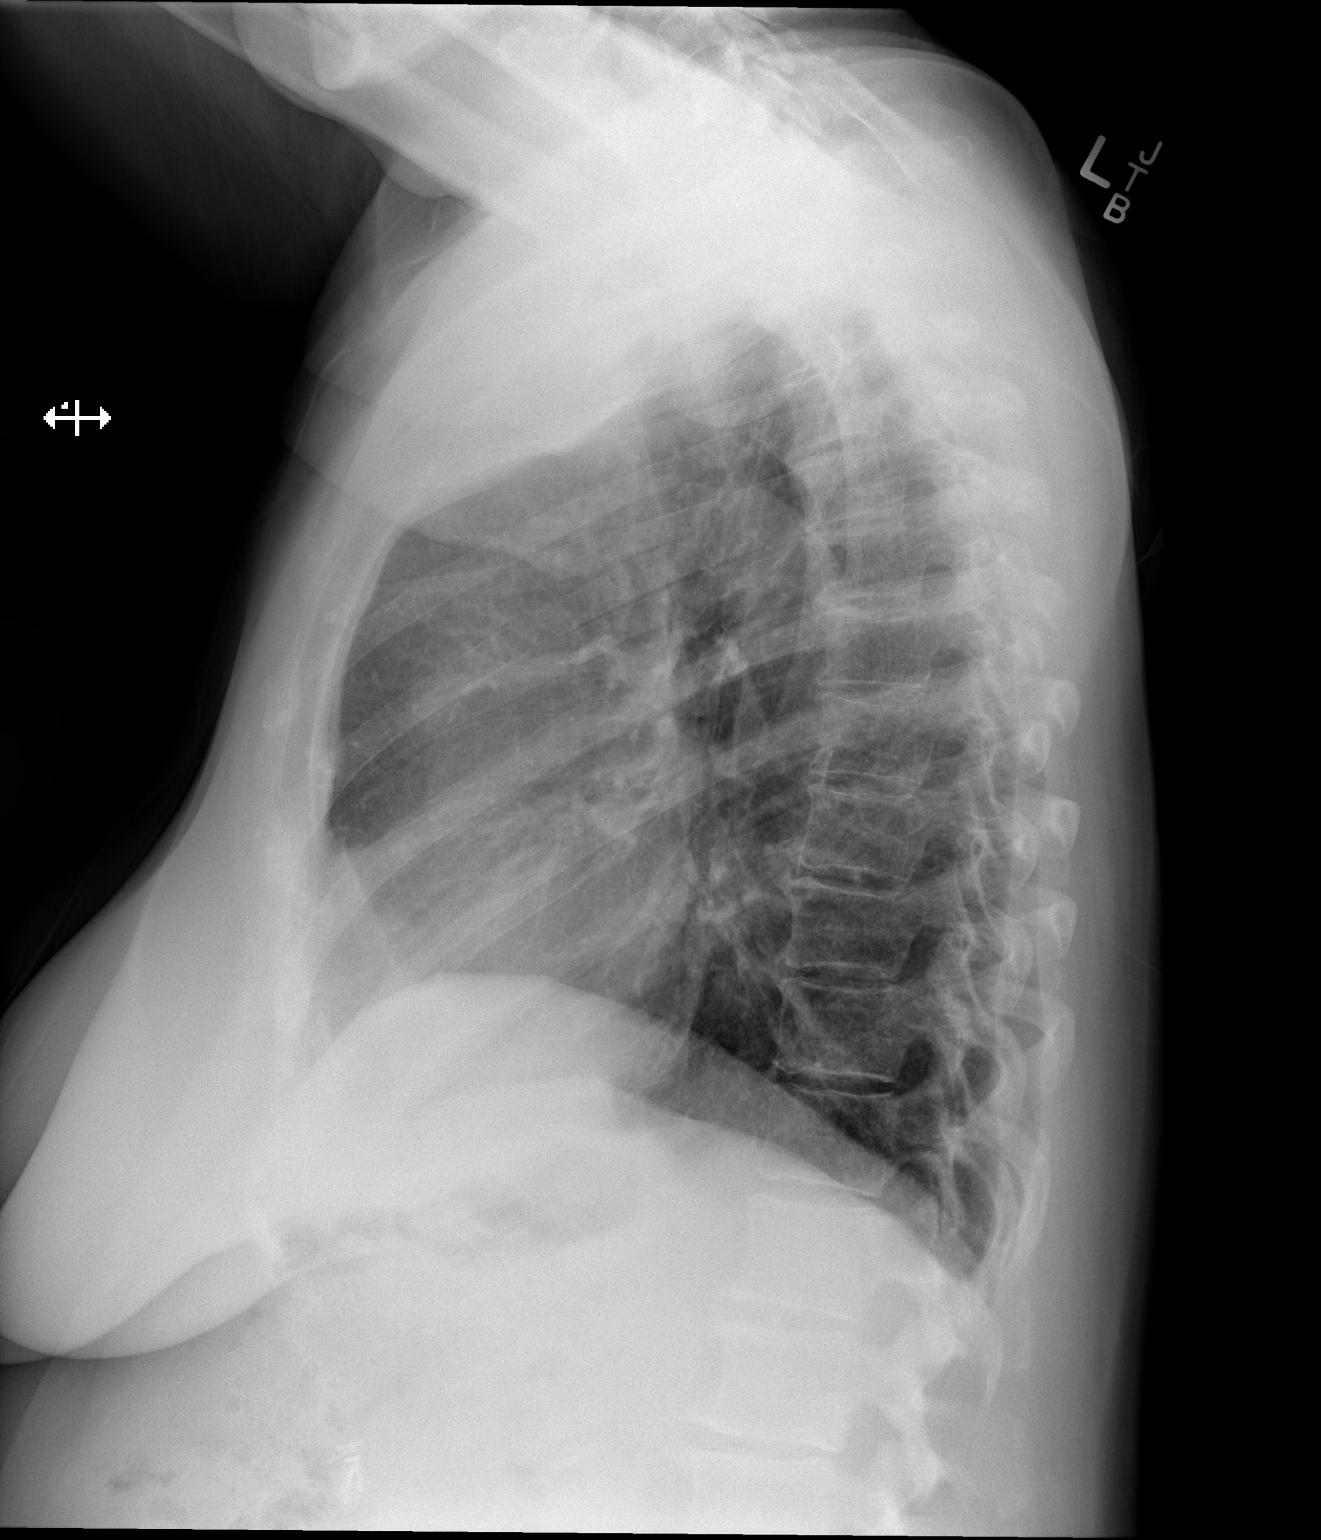

[2 of 2 positions shown; findings below may reference images not displayed]

FINDINGS: The heart size and mediastinal contours are within normal limits.
Both lungs are clear. The visualized skeletal structures are
unremarkable.
IMPRESSION: No active cardiopulmonary disease.

## 2019-08-29 NOTE — Progress Notes (Signed)
HPI: 79 y.o. female presenting today as a new patient for evaluation of a callus and blister that has developed to the plantar aspect of the left hallux secondary to dropfoot.  Patient states that she underwent back surgery in January 2020 and she was informed by the surgeon that a screw penetrated one of her nerves.  After surgery she developed dropfoot and was unable to lift her left lower extremity or dorsiflex her ankle.  Now due to the ambulation the patient has developed excessive pressure to the plantar aspect of the left hallux and an ulceration has developed.  The ulcer and callus has been there for months now.  She was originally ordered a custom molded metal AFO brace which she states is very uncomfortable.  She eventually ordered an OTC AFO brace which is much more comfortable and more tolerable.  She does not wear the AFO brace daily.  She presents for further treatment and evaluation  Past Medical History:  Diagnosis Date  . Anxiety   . Arthritis    hands, back   . Chronic back pain   . Depression   . Diverticulosis   . Fatty liver   . Fibromyalgia   . GERD (gastroesophageal reflux disease) 2006   EGD by Dr. Lovell Sheehan, reports that she has had reflux quite markedly in the past but its improved since taking medicine   . Headache    use to have migraines when she was younger    . Hemorrhage after delivery of fetus   . Hyperglycemia   . Hypertension    reports that she takes for diuretic   . Hypothyroid   . Pre-diabetes   . S/P colonoscopy 2006   Dr. Lovell Sheehan     Physical Exam: General: The patient is alert and oriented x3 in no acute distress.  Dermatology: Ulcer noted to the plantar aspect of the left hallux measuring approximately 1.5 x 1.5 x 0.2 cm.  To the noted ulceration there is no eschar.  There is a minimal amount of slough fibrin and necrotic tissue noted.  Granulation tissue and wound base is red.  There is no exposed bone muscle tendon ligament or joint.   Periwound integrity is intact.  Vascular: Palpable pedal pulses bilaterally. No edema or erythema noted. Capillary refill within normal limits.  Neurological: Epicritic and protective threshold grossly intact bilaterally.   Musculoskeletal Exam: Range of motion within normal limits to all pedal and ankle joints bilateral. Muscle strength 5/5 in all groups bilateral with exception of loss of peroneal dorsiflexion and eversion noted to the left lower extremity.  Patient subsequently developed a reducible equinovarus deformity..   Assessment: 1.  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  Ulcer left hallux secondary to dropfoot deformity 3.  Reducible equinovarus deformity LLE   Plan of Care:  1. Patient evaluated.  2.  Medically necessary excisional debridement including subcutaneous tissue was performed using a tissue nipper.  Excisional debridement of all necrotic nonviable tissue down to the healthy bleeding viable tissue was performed with post debridement measurement same as pre- 3.  Recommend antibiotic ointment and a Band-Aid to the hallux ulcer daily 4.  I explained to the patient that she must wear her AFO brace to alleviate pressure from the left hallux.  Patient understands.  She also must wear good supportive shoes and not go barefoot.  I also explained the patient that wearing the AFO brace will help minimize her stability issues and her risk of falls.  5.  Return to clinic in 3 months      Edrick Kins, DPM [facility]  Dr. Edrick Kins, DPM    [address] [place] [address], [place] [address]                Office [phone]  Fax [phone]

## 2019-11-10 DIAGNOSIS — F39 Unspecified mood [affective] disorder: Secondary | ICD-10-CM | POA: Diagnosis not present

## 2019-11-10 DIAGNOSIS — Z712 Person consulting for explanation of examination or test findings: Secondary | ICD-10-CM | POA: Diagnosis not present

## 2019-11-10 DIAGNOSIS — E1122 Type 2 diabetes mellitus with diabetic chronic kidney disease: Secondary | ICD-10-CM | POA: Diagnosis not present

## 2019-11-10 DIAGNOSIS — Z6830 Body mass index (BMI) 30.0-30.9, adult: Secondary | ICD-10-CM | POA: Diagnosis not present

## 2019-11-10 DIAGNOSIS — M5136 Other intervertebral disc degeneration, lumbar region: Secondary | ICD-10-CM | POA: Diagnosis not present

## 2019-11-10 DIAGNOSIS — N182 Chronic kidney disease, stage 2 (mild): Secondary | ICD-10-CM | POA: Diagnosis not present

## 2019-11-10 DIAGNOSIS — Z0000 Encounter for general adult medical examination without abnormal findings: Secondary | ICD-10-CM | POA: Diagnosis not present

## 2019-11-10 DIAGNOSIS — M21372 Foot drop, left foot: Secondary | ICD-10-CM | POA: Diagnosis not present

## 2019-11-10 DIAGNOSIS — F5101 Primary insomnia: Secondary | ICD-10-CM | POA: Diagnosis not present

## 2019-11-15 DIAGNOSIS — M797 Fibromyalgia: Secondary | ICD-10-CM | POA: Diagnosis not present

## 2019-11-15 DIAGNOSIS — M5117 Intervertebral disc disorders with radiculopathy, lumbosacral region: Secondary | ICD-10-CM | POA: Diagnosis not present

## 2019-11-15 DIAGNOSIS — F5101 Primary insomnia: Secondary | ICD-10-CM | POA: Diagnosis not present

## 2019-11-15 DIAGNOSIS — Z0001 Encounter for general adult medical examination with abnormal findings: Secondary | ICD-10-CM | POA: Diagnosis not present

## 2019-11-15 DIAGNOSIS — I10 Essential (primary) hypertension: Secondary | ICD-10-CM | POA: Diagnosis not present

## 2019-11-15 DIAGNOSIS — N182 Chronic kidney disease, stage 2 (mild): Secondary | ICD-10-CM | POA: Diagnosis not present

## 2019-11-15 DIAGNOSIS — E1122 Type 2 diabetes mellitus with diabetic chronic kidney disease: Secondary | ICD-10-CM | POA: Diagnosis not present

## 2019-11-15 DIAGNOSIS — E782 Mixed hyperlipidemia: Secondary | ICD-10-CM | POA: Diagnosis not present

## 2019-11-15 DIAGNOSIS — F330 Major depressive disorder, recurrent, mild: Secondary | ICD-10-CM | POA: Diagnosis not present

## 2019-11-15 DIAGNOSIS — E039 Hypothyroidism, unspecified: Secondary | ICD-10-CM | POA: Diagnosis not present

## 2019-11-29 ENCOUNTER — Ambulatory Visit: Payer: Medicare PPO | Admitting: Podiatry

## 2019-11-29 ENCOUNTER — Other Ambulatory Visit: Payer: Self-pay

## 2019-11-29 DIAGNOSIS — M21372 Foot drop, left foot: Secondary | ICD-10-CM

## 2019-11-29 DIAGNOSIS — L97522 Non-pressure chronic ulcer of other part of left foot with fat layer exposed: Secondary | ICD-10-CM

## 2019-11-29 NOTE — Progress Notes (Signed)
   HPI: 79 y.o. female presenting today for follow-up evaluation regarding the patient's left lower extremity dropfoot that occurred secondary to back surgery.  Patient is concerned that her left great toe is deviating laterally and causing pressure against the second toe.  Patient also states that she did wear an AFO in the past however she was not able to tolerate it because it caused pain.  She presents for further treatment and evaluation  Past Medical History:  Diagnosis Date  . Anxiety   . Arthritis    hands, back   . Chronic back pain   . Depression   . Diverticulosis   . Fatty liver   . Fibromyalgia   . GERD (gastroesophageal reflux disease) 2006   EGD by Dr. Lovell Sheehan, reports that she has had reflux quite markedly in the past but its improved since taking medicine   . Headache    use to have migraines when she was younger    . Hemorrhage after delivery of fetus   . Hyperglycemia   . Hypertension    reports that she takes for diuretic   . Hypothyroid   . Pre-diabetes   . S/P colonoscopy 2006   Dr. Lovell Sheehan     Physical Exam: General: The patient is alert and oriented x3 in no acute distress.  Dermatology: Skin is cool, dry and supple bilateral lower extremities. Negative for open lesions or macerations.  Vascular: Palpable pedal pulses bilaterally. No edema or erythema noted. Capillary refill within normal limits.  Neurological: Epicritic and protective threshold grossly intact bilaterally.   Musculoskeletal Exam: Range of motion within normal limits to all pedal and ankle joints bilateral. Muscle strength 5/5 in all groups bilateral with exception of loss of peroneal dorsiflexion and eversion noted to the left lower extremity.  Patient subsequently developed a reducible equinovarus deformity.. There is some lateral deviation of the left hallux secondary to the arch collapse and the dropfoot deformity to the left lower extremity.  Assessment: 1.  Dropfoot LLE secondary to  back surgery January 2020 2.  Reducible equinovarus deformity LLE  Plan of Care:  1. Patient evaluated.  2.  Today we will arrange an appointment with Pedorthist for low-profile AFO versus Richie brace.  I also explained the patient that wearing the AFO brace will help minimize her stability issues and her risk of falls. 3.  Silicone toe spacer provided to alleviate pressure of the left hallux against the second toe 4.  Return to clinic as needed      Felecia Shelling, DPM [facility]  Dr. Felecia Shelling, DPM    [address] [place], [place] [address]                Office [phone]  Fax [phone]

## 2019-12-14 ENCOUNTER — Other Ambulatory Visit: Payer: Self-pay

## 2019-12-14 ENCOUNTER — Ambulatory Visit: Payer: Medicare PPO | Admitting: Orthotics

## 2019-12-14 DIAGNOSIS — M21372 Foot drop, left foot: Secondary | ICD-10-CM

## 2019-12-14 DIAGNOSIS — L97522 Non-pressure chronic ulcer of other part of left foot with fat layer exposed: Secondary | ICD-10-CM

## 2019-12-14 NOTE — Progress Notes (Signed)
Orderd OTS brace:  Surefit AFO light Confirm:  [id number]

## 2019-12-18 ENCOUNTER — Other Ambulatory Visit (HOSPITAL_COMMUNITY): Payer: Self-pay | Admitting: Internal Medicine

## 2019-12-18 DIAGNOSIS — Z1231 Encounter for screening mammogram for malignant neoplasm of breast: Secondary | ICD-10-CM

## 2019-12-25 ENCOUNTER — Other Ambulatory Visit: Payer: Medicare PPO | Admitting: Orthotics

## 2019-12-28 ENCOUNTER — Ambulatory Visit: Payer: Medicare PPO | Admitting: Orthotics

## 2019-12-28 ENCOUNTER — Other Ambulatory Visit: Payer: Medicare PPO | Admitting: Orthotics

## 2019-12-28 ENCOUNTER — Other Ambulatory Visit: Payer: Self-pay

## 2019-12-28 DIAGNOSIS — L97522 Non-pressure chronic ulcer of other part of left foot with fat layer exposed: Secondary | ICD-10-CM | POA: Diagnosis not present

## 2019-12-28 DIAGNOSIS — M21372 Foot drop, left foot: Secondary | ICD-10-CM | POA: Diagnosis not present

## 2019-12-28 NOTE — Progress Notes (Signed)
Patient picked up AFO (OTS) to address foot drop LEFT in sagittal plane.

## 2020-01-03 ENCOUNTER — Telehealth: Payer: Self-pay | Admitting: Podiatry

## 2020-01-03 NOTE — Telephone Encounter (Signed)
Received call back from Alvo at [facility] and the brace [phone] was approved. Auth # [id number] valid 10.7.2021 thru 1.4.2022.Marland KitchenMarland Kitchen

## 2020-01-03 NOTE — Telephone Encounter (Signed)
Casey from [facility] [employer] [facility] called about the prior auth that was sent in for the ots brace that pt was given. She needed more information.   I checked chart and it looks like the previous brace she received from somewhere else was custom made and the one we are trying to get auth for is pre fab. She said that was helpful and thank you.

## 2020-01-25 ENCOUNTER — Ambulatory Visit (HOSPITAL_COMMUNITY): Payer: Medicare Other

## 2020-02-14 DIAGNOSIS — M21372 Foot drop, left foot: Secondary | ICD-10-CM | POA: Diagnosis not present

## 2020-02-14 DIAGNOSIS — F5104 Psychophysiologic insomnia: Secondary | ICD-10-CM | POA: Diagnosis not present

## 2020-02-14 DIAGNOSIS — M797 Fibromyalgia: Secondary | ICD-10-CM | POA: Diagnosis not present

## 2020-02-14 DIAGNOSIS — Z0000 Encounter for general adult medical examination without abnormal findings: Secondary | ICD-10-CM | POA: Diagnosis not present

## 2020-02-14 DIAGNOSIS — Z760 Encounter for issue of repeat prescription: Secondary | ICD-10-CM | POA: Diagnosis not present

## 2020-02-14 DIAGNOSIS — I10 Essential (primary) hypertension: Secondary | ICD-10-CM | POA: Diagnosis not present

## 2020-02-14 DIAGNOSIS — F32A Depression, unspecified: Secondary | ICD-10-CM | POA: Diagnosis not present

## 2020-02-22 DIAGNOSIS — I10 Essential (primary) hypertension: Secondary | ICD-10-CM | POA: Diagnosis not present

## 2020-02-22 DIAGNOSIS — M797 Fibromyalgia: Secondary | ICD-10-CM | POA: Diagnosis not present

## 2020-03-14 DIAGNOSIS — I10 Essential (primary) hypertension: Secondary | ICD-10-CM | POA: Diagnosis not present

## 2020-03-14 DIAGNOSIS — Z7189 Other specified counseling: Secondary | ICD-10-CM | POA: Diagnosis not present

## 2020-03-14 DIAGNOSIS — M545 Low back pain, unspecified: Secondary | ICD-10-CM | POA: Diagnosis not present

## 2020-03-14 DIAGNOSIS — F419 Anxiety disorder, unspecified: Secondary | ICD-10-CM | POA: Diagnosis not present

## 2020-03-14 DIAGNOSIS — F5104 Psychophysiologic insomnia: Secondary | ICD-10-CM | POA: Diagnosis not present

## 2020-04-10 ENCOUNTER — Ambulatory Visit (INDEPENDENT_AMBULATORY_CARE_PROVIDER_SITE_OTHER): Payer: Medicare PPO

## 2020-04-10 ENCOUNTER — Ambulatory Visit: Payer: Medicare PPO | Admitting: Podiatry

## 2020-04-10 ENCOUNTER — Other Ambulatory Visit: Payer: Self-pay

## 2020-04-10 DIAGNOSIS — M21372 Foot drop, left foot: Secondary | ICD-10-CM

## 2020-04-10 NOTE — Progress Notes (Signed)
   HPI: 80 y.o. female presenting today for follow-up evaluation regarding the patient's left lower extremity dropfoot that occurred secondary to back surgery.  Patient is concerned that her left great toe is deviating laterally and causing pressure against the second toe.  The patient did receive a new custom AFO brace which she wears with her tennis shoes and it helped significantly.  She states when she has the brace on she does not have any pedal complaints.  Her concern today is when she does not wear the brace and she is barefoot for example getting into the shower, her arch completely collapses to her dropfoot left lower extremity.  She presents for further treatment evaluation  Past Medical History:  Diagnosis Date  . Anxiety   . Arthritis    hands, back   . Chronic back pain   . Depression   . Diverticulosis   . Fatty liver   . Fibromyalgia   . GERD (gastroesophageal reflux disease) 2006   EGD by Dr. Lovell Sheehan, reports that she has had reflux quite markedly in the past but its improved since taking medicine   . Headache    use to have migraines when she was younger    . Hemorrhage after delivery of fetus   . Hyperglycemia   . Hypertension    reports that she takes for diuretic   . Hypothyroid   . Pre-diabetes   . S/P colonoscopy 2006   Dr. Lovell Sheehan     Physical Exam: General: The patient is alert and oriented x3 in no acute distress.  Dermatology: Skin is cool, dry and supple bilateral lower extremities. Negative for open lesions or macerations.  Vascular: Palpable pedal pulses bilaterally. No edema or erythema noted. Capillary refill within normal limits.  Neurological: Epicritic and protective threshold grossly intact bilaterally.   Musculoskeletal Exam: Range of motion within normal limits to all pedal and ankle joints bilateral. Muscle strength 5/5 in all groups bilateral with exception of loss of peroneal dorsiflexion and eversion noted to the left lower extremity.   Patient subsequently developed a reducible equinovarus deformity.. There is some lateral deviation of the left hallux secondary to the arch collapse and the dropfoot deformity to the left lower extremity.  Radiographic exam: Normal osseous mineralization.  Joint spaces preserved.  Pes planus deformity with collapse of the calcaneal inclination angle and metatarsal declination angle noted.  No fracture identified.  Assessment: 1.  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  Reducible equinovarus deformity LLE  Plan of Care:  1. Patient evaluated.  2.  Explained to the patient that I would advise against going barefoot as much as possible.  The best way to treat her dropfoot deformity is to wear the good supportive shoes and AFO brace.  Recommend wearing is much as reasonably possible 3.  Return to clinic as needed     Felecia Shelling, DPM [facility]  Dr. Felecia Shelling, DPM    [address] [place], [place] [address]                Office [phone]  Fax [phone]

## 2020-04-18 DIAGNOSIS — I10 Essential (primary) hypertension: Secondary | ICD-10-CM | POA: Diagnosis not present

## 2020-05-02 DIAGNOSIS — E039 Hypothyroidism, unspecified: Secondary | ICD-10-CM | POA: Diagnosis not present

## 2020-05-02 DIAGNOSIS — I10 Essential (primary) hypertension: Secondary | ICD-10-CM | POA: Diagnosis not present

## 2020-05-02 DIAGNOSIS — M79672 Pain in left foot: Secondary | ICD-10-CM | POA: Diagnosis not present

## 2020-05-02 DIAGNOSIS — R7303 Prediabetes: Secondary | ICD-10-CM | POA: Diagnosis not present

## 2020-05-15 IMAGING — MG DIGITAL SCREENING BILATERAL MAMMOGRAM WITH TOMO AND CAD
6 of 10 series · 6 of 30 positions shown · non-contrast
Comparison: Previous exam(s).

ACR Breast Density Category a: The breast tissue is almost entirely
fatty.

CLINICAL DATA: Screening.

EXAM:
DIGITAL SCREENING BILATERAL MAMMOGRAM WITH TOMO AND CAD

[R MLO synth-2D (1 of 2)]
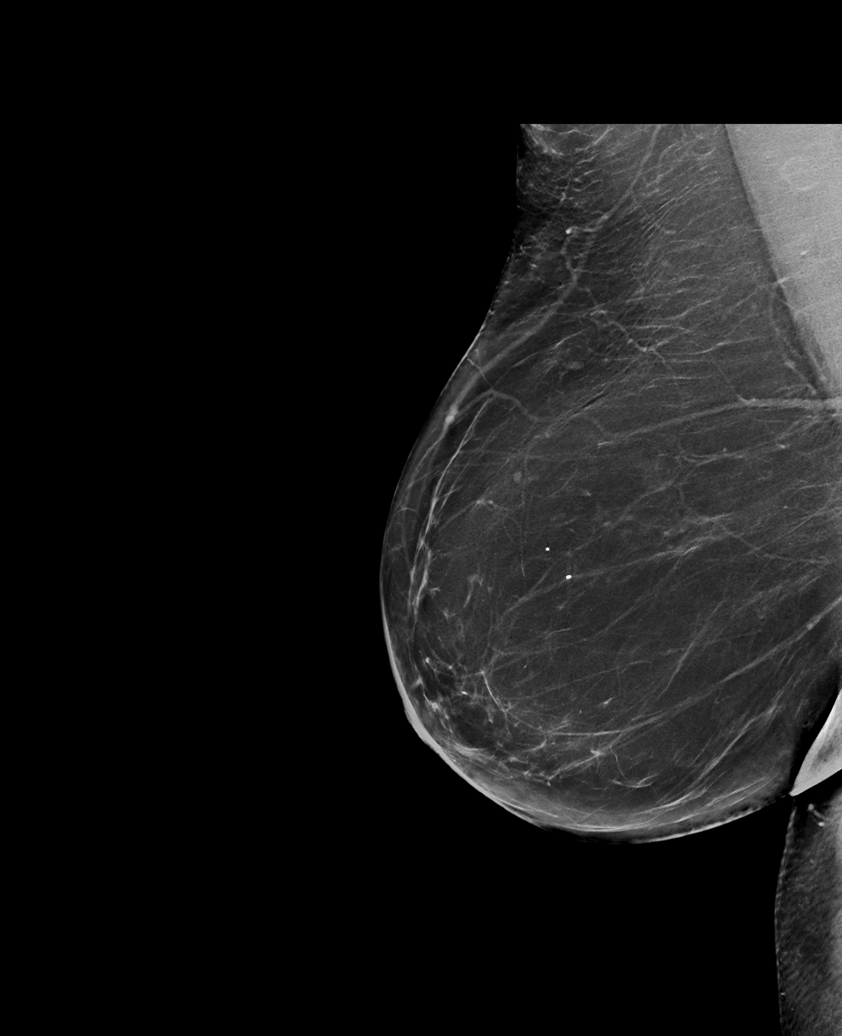

[L CC synth-2D]
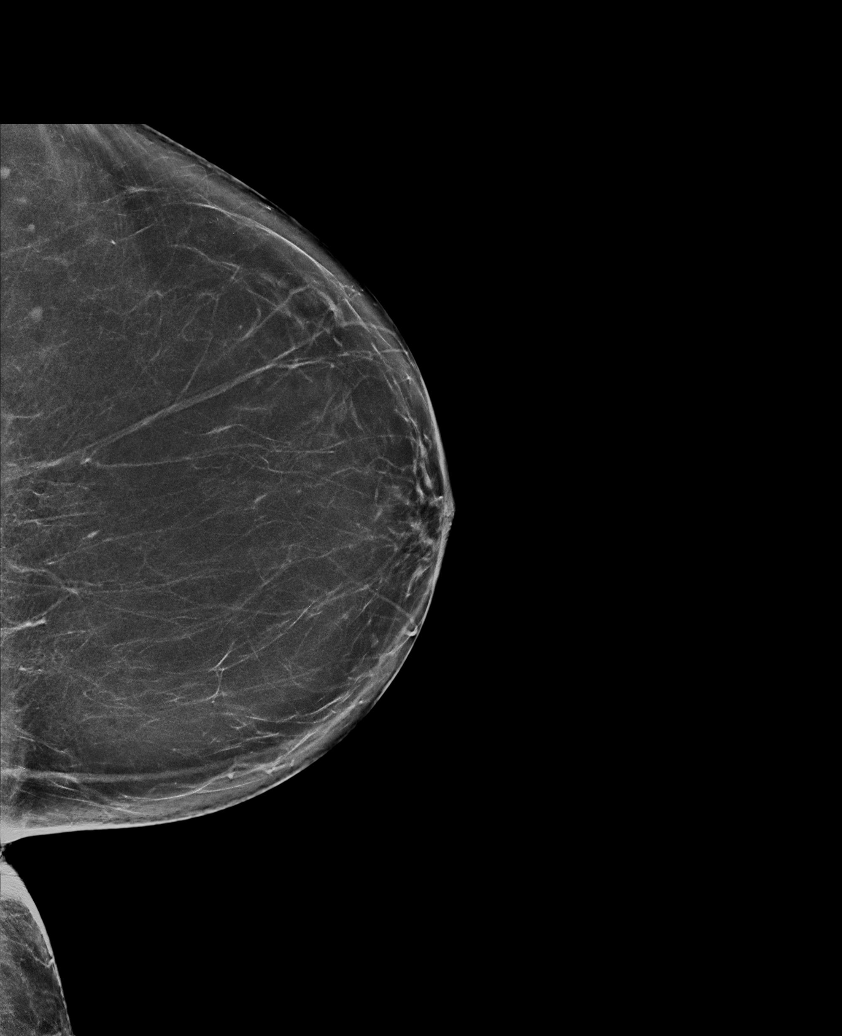

[R MLO synth-2D (2 of 2)]
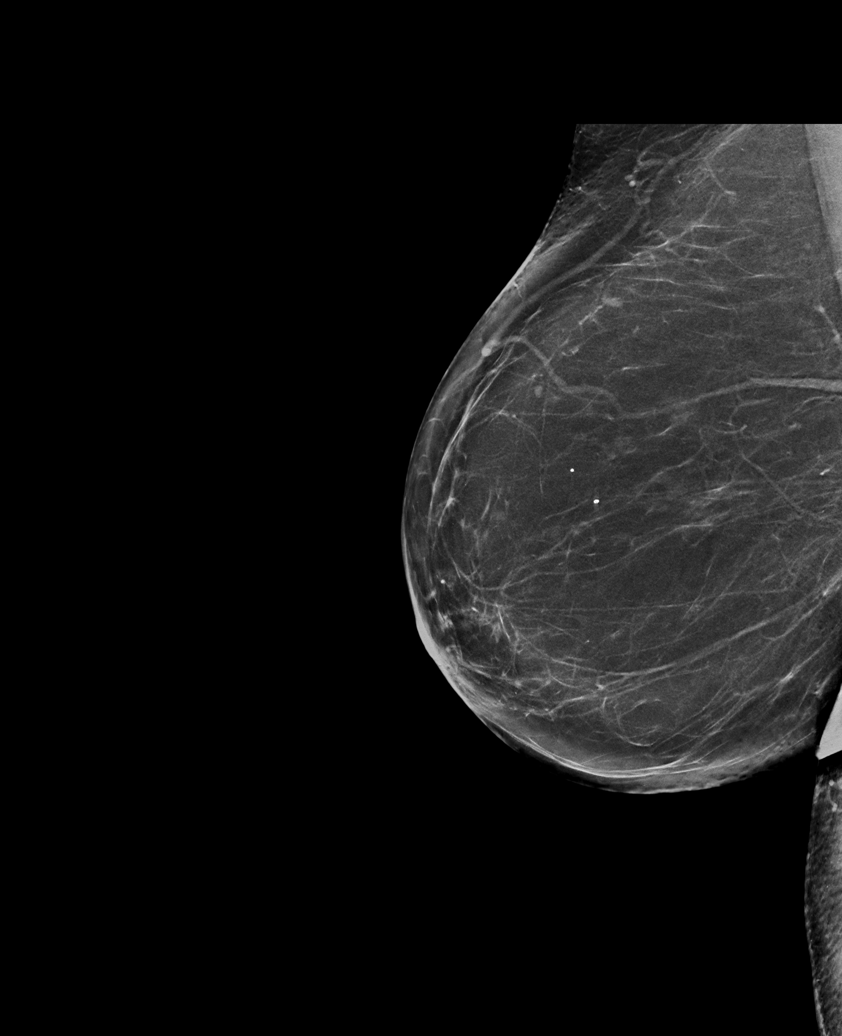

[L MLO synth-2D]
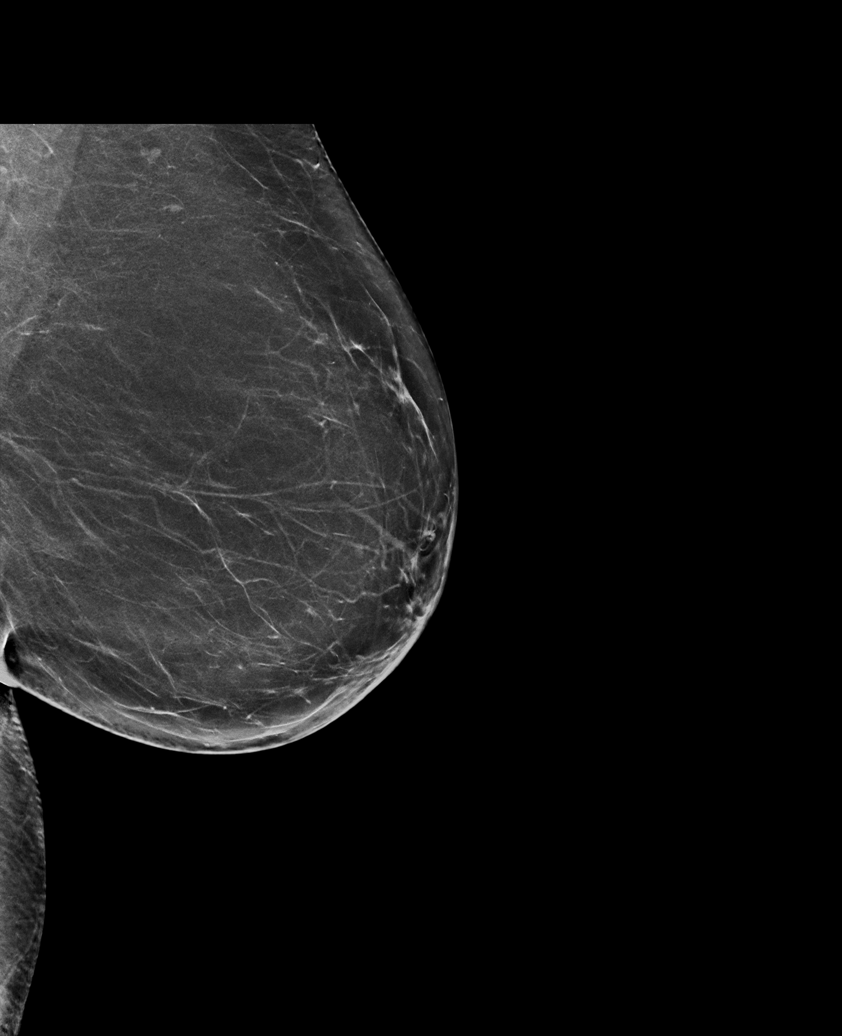

[R CC synth-2D]
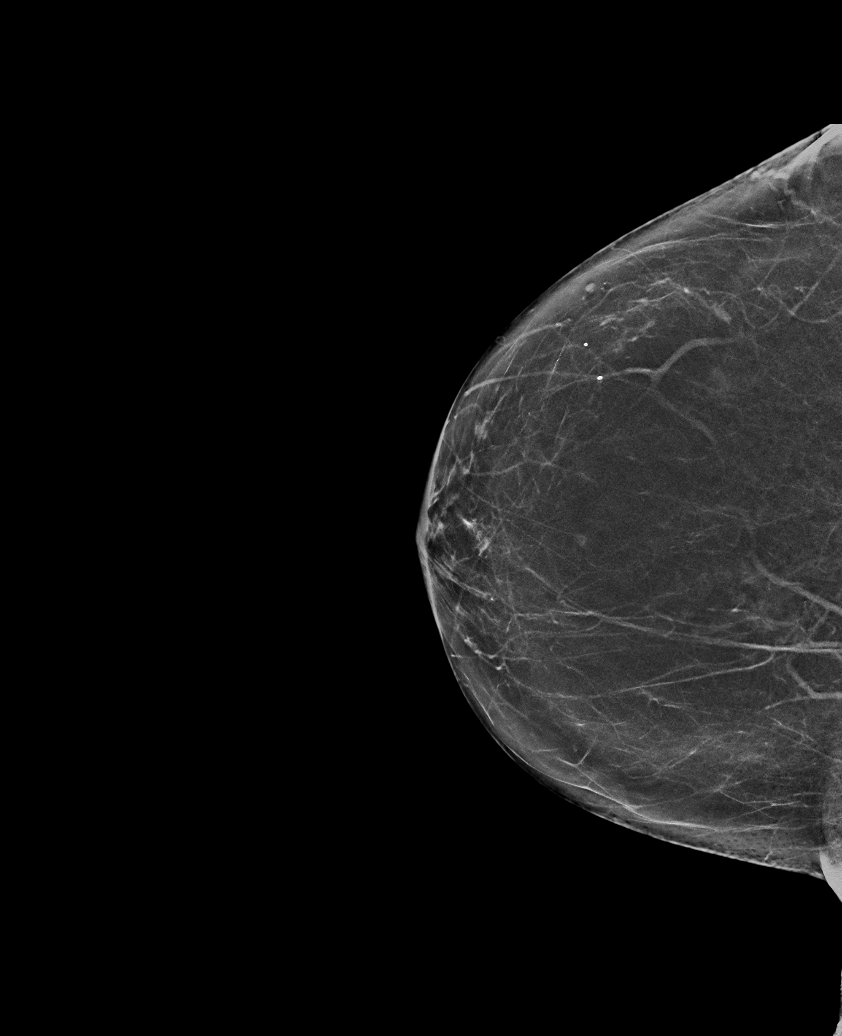

[R MLO tomo · tomo slice 37/74.0]
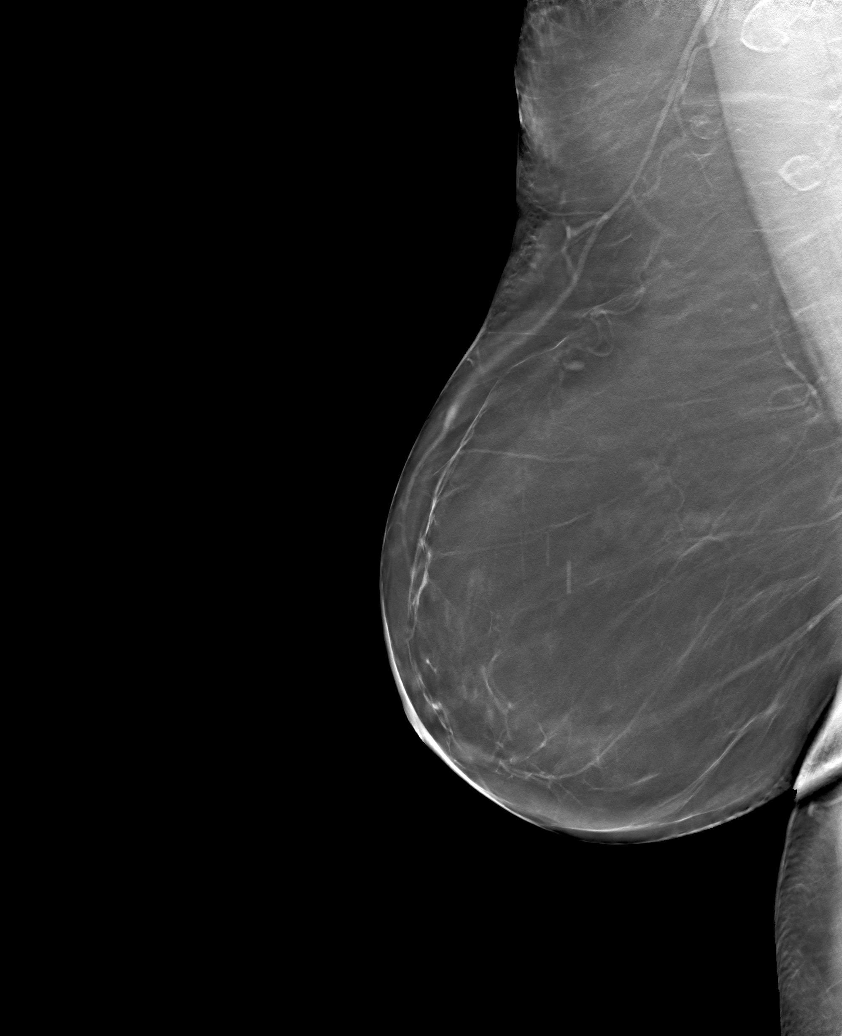

[6 of 30 positions shown; findings below may reference images not displayed]

FINDINGS: There are no findings suspicious for malignancy. Images were
processed with CAD.
IMPRESSION: No mammographic evidence of malignancy. A result letter of this
screening mammogram will be mailed directly to the patient.

RECOMMENDATION:
Screening mammogram in one year. (Code:8Y-[id number])

BI-RADS CATEGORY  1: Negative.

## 2020-06-03 DIAGNOSIS — Z20828 Contact with and (suspected) exposure to other viral communicable diseases: Secondary | ICD-10-CM | POA: Diagnosis not present

## 2020-06-10 DIAGNOSIS — Z20828 Contact with and (suspected) exposure to other viral communicable diseases: Secondary | ICD-10-CM | POA: Diagnosis not present

## 2020-06-17 DIAGNOSIS — Z20828 Contact with and (suspected) exposure to other viral communicable diseases: Secondary | ICD-10-CM | POA: Diagnosis not present

## 2020-07-19 ENCOUNTER — Ambulatory Visit (HOSPITAL_COMMUNITY)
Admission: RE | Admit: 2020-07-19 | Discharge: 2020-07-19 | Disposition: A | Discharge status: Home / Self Care | Payer: Medicare PPO | Source: Ambulatory Visit | Attending: Internal Medicine | Admitting: Internal Medicine

## 2020-07-19 DIAGNOSIS — Z1231 Encounter for screening mammogram for malignant neoplasm of breast: Secondary | ICD-10-CM | POA: Insufficient documentation

## 2020-11-08 ENCOUNTER — Other Ambulatory Visit: Payer: Self-pay

## 2020-11-08 ENCOUNTER — Other Ambulatory Visit: Payer: Medicare PPO

## 2021-05-02 ENCOUNTER — Other Ambulatory Visit: Payer: Self-pay | Admitting: Internal Medicine

## 2021-05-02 DIAGNOSIS — Z780 Asymptomatic menopausal state: Secondary | ICD-10-CM

## 2021-05-02 DIAGNOSIS — N951 Menopausal and female climacteric states: Secondary | ICD-10-CM

## 2021-06-04 ENCOUNTER — Ambulatory Visit: Payer: Medicare PPO | Admitting: Podiatry

## 2021-06-04 ENCOUNTER — Ambulatory Visit (INDEPENDENT_AMBULATORY_CARE_PROVIDER_SITE_OTHER): Payer: Medicare PPO

## 2021-06-04 ENCOUNTER — Other Ambulatory Visit: Payer: Self-pay

## 2021-06-04 DIAGNOSIS — M19072 Primary osteoarthritis, left ankle and foot: Secondary | ICD-10-CM

## 2021-06-04 DIAGNOSIS — M76822 Posterior tibial tendinitis, left leg: Secondary | ICD-10-CM | POA: Diagnosis not present

## 2021-06-04 DIAGNOSIS — M21372 Foot drop, left foot: Secondary | ICD-10-CM

## 2021-06-16 NOTE — Progress Notes (Signed)
? ?HPI: 81 y.o. female presenting today for follow-up evaluation regarding the patient's left lower extremity dropfoot that occurred secondary to back surgery.  Patient has been wearing a custom molded AFO and recently there has been minimal improvement.  She says that now her foot is very painful despite wearing the AFO almost daily.  Patient states that when she does not wear the AFO she has complete collapse of the arch of the foot.  She feels very unstable and is very concerned.  The patient would like to discuss surgical options for the left foot.  She would like to maintain a healthy active lifestyle and it is inhibiting her significantly on a daily basis.  Patient states that she constantly has pain and tenderness associated to the left foot secondary to the deformity. ?Past Medical History:  ?Diagnosis Date  ? Anxiety   ? Arthritis   ? hands, back   ? Chronic back pain   ? Depression   ? Diverticulosis   ? Fatty liver   ? Fibromyalgia   ? GERD (gastroesophageal reflux disease) 2006  ? EGD by Dr. Arnoldo Morale, reports that she has had reflux quite markedly in the past but its improved since taking medicine   ? Headache   ? use to have migraines when she was younger    ? Hemorrhage after delivery of fetus   ? Hyperglycemia   ? Hypertension   ? reports that she takes for diuretic   ? Hypothyroid   ? Pre-diabetes   ? S/P colonoscopy 2006  ? Dr. Arnoldo Morale  ? ?  ?Physical Exam: ?General: The patient is alert and oriented x3 in no acute distress. ? ?Dermatology: Skin is cool, dry and supple bilateral lower extremities. Negative for open lesions or macerations. ? ?Vascular: Palpable pedal pulses bilaterally. No edema or erythema noted. Capillary refill within normal limits. ? ?Neurological: Epicritic and protective threshold grossly intact bilaterally.  ? ?Musculoskeletal Exam: With weightbearing there is complete collapse of the medial longitudinal arch with a calcaneal valgus.  Instability and associated tenderness with  weightbearing as well.  There is some lateral deviation of the left hallux secondary to the arch collapse and the dropfoot deformity to the left lower extremity. ? ?Radiographic exam 06/04/2021 LT foot and ankle: Normal osseous mineralization. Severe pes planus deformity with collapse of the calcaneal inclination angle and metatarsal declination angle noted.  Degenerative changes also noted throughout the pedal joints of the foot.  No fracture identified. ? ?Assessment: ?1.  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  Reducible equinovarus deformity LLE ?3.  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 ?Plan of Care:  ?1. Patient evaluated.  ?2.  The patient is very concerned due to her medial longitudinal arch collapse of the left foot.  She says that recently even the brace is not helping.  She wants to be active but the foot is inhibiting her ability to perform daily activities of living without pain as well as continued active lifestyle but she is wanting to pursue.  The patient has failed multiple conservative modalities including external bracing and shoe gear modifications.  She presents for further treatment and evaluation ?2. Today we discussed the conservative versus surgical management of the presenting pathology.  Surgery was explained in detail including the triple arthrodesis surgery as well as possible medial column fusion.  Patient understands this is a big surgery with a long recovery.  Patient understands the  risks and benefits.  The purpose of the surgery would be to stabilize the patient's foot through arthrodesis to provide stability of the foot and reduce pain for the patient.  The patient opts for surgical management. All possible complications and details of the procedure were explained. All patient questions were answered. No guarantees were expressed or implied. ?3. Authorization for surgery was initiated today.  Surgery will consist of triple arthrodesis left.  Possible medial column fusion left ?4.  Patient is requesting postoperatively to be in a rehab facility for recovery.  I do agree this would be beneficial for the patient.  Office will work on arranging this postoperatively ?5.  Medical clearance from PCP prior to surgery ?6.  Return to clinic 1 week postop ? ?  ?Edrick Kins, DPM ?[place] ? ?Dr. Edrick Kins, DPM  ?  ?[address]                                        ?[place], [place] [address]                ?Office [phone]  ?Fax [phone] ? ? ? ? ?

## 2021-06-25 ENCOUNTER — Encounter: Payer: Self-pay | Admitting: Podiatry

## 2021-06-26 ENCOUNTER — Telehealth: Payer: Self-pay | Admitting: Urology

## 2021-06-26 ENCOUNTER — Other Ambulatory Visit: Payer: Self-pay | Admitting: Podiatry

## 2021-06-26 DIAGNOSIS — M19072 Primary osteoarthritis, left ankle and foot: Secondary | ICD-10-CM

## 2021-06-26 NOTE — Telephone Encounter (Signed)
DOS - 07/24/21 ? ?TRIPLE ARTHRODESIS LEFT --- [phone] ? ?[employer] EFFECTIVE DATE - 03/24/19 ? ? ?PER [facility] WEBSITE FOR CPT CODE 60454 The following codes do not require a pre-authorization.  ?

## 2021-06-26 NOTE — Progress Notes (Signed)
PRN postop 

## 2021-07-18 ENCOUNTER — Other Ambulatory Visit: Payer: Self-pay | Admitting: Podiatry

## 2021-07-23 ENCOUNTER — Ambulatory Visit: Payer: Medicare PPO | Admitting: Podiatry

## 2021-07-23 DIAGNOSIS — M76822 Posterior tibial tendinitis, left leg: Secondary | ICD-10-CM | POA: Diagnosis not present

## 2021-07-23 DIAGNOSIS — M19072 Primary osteoarthritis, left ankle and foot: Secondary | ICD-10-CM

## 2021-07-23 NOTE — Progress Notes (Signed)
? ?HPI: 81 y.o. female presenting today for follow-up evaluation and surgical consultation for surgery scheduled for tomorrow.  Patient states that she has been working with [facility] home health to prepare her for surgery.  She has been practicing with the knee scooter, wheelchair, and shower chair.  She also has hired a home health aide postoperatively to come to the house 4 times throughout the day to assist her.  She presents today to answer additional questions and review surgery prior to tomorrow. ? ?Past Medical History:  ?Diagnosis Date  ? Anxiety   ? Arthritis   ? hands, back   ? Chronic back pain   ? Depression   ? Diverticulosis   ? Fatty liver   ? Fibromyalgia   ? GERD (gastroesophageal reflux disease) 2006  ? EGD by Dr. Arnoldo Morale, reports that she has had reflux quite markedly in the past but its improved since taking medicine   ? Headache   ? use to have migraines when she was younger    ? Hemorrhage after delivery of fetus   ? Hyperglycemia   ? Hypertension   ? reports that she takes for diuretic   ? Hypothyroid   ? Pre-diabetes   ? S/P colonoscopy 2006  ? Dr. Arnoldo Morale  ? ?Past Surgical History:  ?Procedure Laterality Date  ? APPENDECTOMY    ? BREAST BIOPSY Right   ? cataracts Bilateral   ? CHOLECYSTECTOMY  1998  ? COLONOSCOPY WITH PROPOFOL N/A 04/26/2014  ? Procedure: COLONOSCOPY WITH PROPOFOL;  Surgeon: Daneil Dolin, MD;  [facility]: [facility];  Service: Endoscopy;  Laterality: N/A;  0939 in cecum, total withdrawal time, 10 min  ? LUMBAR DISC SURGERY  1980's  ? LUMBAR LAMINECTOMY/DECOMPRESSION MICRODISCECTOMY N/A 05/10/2017  ? Procedure: L3-4 DECOMPRESSION, RIGHT MICRODISCECTOMY;  Surgeon: Marybelle Killings, MD;  Location: [place];  Service: Orthopedics;  Laterality: N/A;  ? TOTAL ABDOMINAL HYSTERECTOMY W/ BILATERAL SALPINGOOPHORECTOMY    ? ?Allergies  ?Allergen Reactions  ? Sulfonamide Derivatives Shortness Of Breath and Rash  ?  Rash on chest  ? Adhesive [Tape]   ?  Creates skin tears  ? ? ? ? ? ?Physical  Exam: ?General: The patient is alert and oriented x3 in no acute distress. ? ?Dermatology: Skin is cool, dry and supple bilateral lower extremities. Negative for open lesions or macerations. ? ?Vascular: Palpable pedal pulses bilaterally. No edema or erythema noted. Capillary refill within normal limits. ? ?Neurological: Epicritic and protective threshold grossly intact bilaterally.  ? ?Musculoskeletal Exam: With weightbearing there is complete collapse of the medial longitudinal arch with a calcaneal valgus.  Instability and associated tenderness with weightbearing as well.  There is some lateral deviation of the left hallux secondary to the arch collapse and the dropfoot deformity to the left lower extremity. ? ?Radiographic exam 06/04/2021 LT foot and ankle: Normal osseous mineralization. Severe pes planus deformity with collapse of the calcaneal inclination angle and metatarsal declination angle noted.  Degenerative changes also noted throughout the pedal joints of the foot.  No fracture identified. ? ?Assessment: ?1.  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  Reducible equinovarus deformity LLE ?3.  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 ?Plan of Care:  ?1. Patient evaluated.  ?2.  Patient scheduled for triple arthrodesis possible medial column fusion tomorrow, 07/24/2021. ?3.  Today we had a very long discussion again regarding postoperative recovery.  She is doing everything preoperatively to prepare  for the surgery.  She has family coming in town for about 2 days and she is also hired a [employer] applications to come at the house 4 times daily to help.  She has also been working with [facility] home rehab to build her upper body strength for nonweightbearing postoperatively and she has been working with them for knee scooter training and to transition in and out of the wheelchair and shower chair.  It seems that the patient is doing  everything appropriately to prepare for the surgery ?4.  Again, all questions were answered and no guarantees were expressed or implied.  The patient's 2 main goals postoperatively is to alleviate pain and be able to wear shoes again to her left foot.  I feel that both of these goals are completely reasonable and triple arthrodesis to better align the foot and alleviate joint pain is appropriate.  Again, this is affecting her daily quality of life.  She is quite healthy for her age ?5.  Return to clinic 1 week postop ?  ?Edrick Kins, DPM ?[facility] ? ?Dr. Edrick Kins, DPM  ?  ?[address]                                        ?[place], [place] [address]                ?Office [phone]  ?Fax [phone] ? ? ? ? ?

## 2021-07-24 ENCOUNTER — Telehealth: Payer: Self-pay | Admitting: Podiatry

## 2021-07-24 ENCOUNTER — Other Ambulatory Visit: Payer: Self-pay | Admitting: Podiatry

## 2021-07-24 ENCOUNTER — Encounter: Payer: Self-pay | Admitting: Podiatry

## 2021-07-24 DIAGNOSIS — M76822 Posterior tibial tendinitis, left leg: Secondary | ICD-10-CM | POA: Diagnosis not present

## 2021-07-24 DIAGNOSIS — M19072 Primary osteoarthritis, left ankle and foot: Secondary | ICD-10-CM | POA: Diagnosis not present

## 2021-07-24 MED ORDER — OXYCODONE-ACETAMINOPHEN 5-325 MG PO TABS: 1.0000 | ORAL_TABLET | ORAL | 0 refills | Status: DC | PRN

## 2021-07-24 MED ORDER — CEPHALEXIN 500 MG PO CAPS: 500.0000 mg | ORAL_CAPSULE | Freq: Three times a day (TID) | ORAL | 0 refills | Status: DC

## 2021-07-24 NOTE — Progress Notes (Signed)
PRN postop 

## 2021-07-24 NOTE — Telephone Encounter (Signed)
Rx re-sent to nearby [facility] on [address] ?

## 2021-07-25 ENCOUNTER — Telehealth: Payer: Self-pay | Admitting: Podiatry

## 2021-07-25 NOTE — Telephone Encounter (Signed)
Pt is needing a order for a skill nurse for rehab Pt cant function in her home environment. ?Call Alcide Evener [phone] to request pre authorization an let them know the about of time she needs in rehab.  ?Pt ask if she can go to [facility] in [place] ? ?Please Advise.  ?

## 2021-07-25 NOTE — Telephone Encounter (Signed)
Attempted to call patient to check in on her.  No answer.  Went to [employer]. ? ?Shelly, I know you were working on getting her into SNF rehab facility previously? We'll chat Monday to see what we can do to help her out. - Dr. Logan Bores

## 2021-07-28 NOTE — Telephone Encounter (Signed)
Thank you so much for the update!  Thanks for calling, Dr. Amalia Hailey ?

## 2021-07-30 ENCOUNTER — Ambulatory Visit (INDEPENDENT_AMBULATORY_CARE_PROVIDER_SITE_OTHER): Payer: Medicare PPO

## 2021-07-30 ENCOUNTER — Ambulatory Visit (INDEPENDENT_AMBULATORY_CARE_PROVIDER_SITE_OTHER): Payer: Medicare PPO | Admitting: Podiatry

## 2021-07-30 DIAGNOSIS — M21372 Foot drop, left foot: Secondary | ICD-10-CM

## 2021-07-30 NOTE — Progress Notes (Signed)
? ?Subjective:  ?Patient presents today status post triple arthrodesis left. DOS: 07/24/2021.  Patient states that she is doing well.  Although she is somewhat weak she is doing much better and she has home health nursing agency assisting her daily.  She has kept the dressings clean dry and intact and has been strictly nonweightbearing to the surgical extremity as instructed. ? ?Past Medical History:  ?Diagnosis Date  ? Anxiety   ? Arthritis   ? hands, back   ? Chronic back pain   ? Depression   ? Diverticulosis   ? Fatty liver   ? Fibromyalgia   ? GERD (gastroesophageal reflux disease) 2006  ? EGD by Dr. Lovell Sheehan, reports that she has had reflux quite markedly in the past but its improved since taking medicine   ? Headache   ? use to have migraines when she was younger    ? Hemorrhage after delivery of fetus   ? Hyperglycemia   ? Hypertension   ? reports that she takes for diuretic   ? Hypothyroid   ? Pre-diabetes   ? S/P colonoscopy 2006  ? Dr. Lovell Sheehan  ? ?  ?Past Surgical History:  ?Procedure Laterality Date  ? APPENDECTOMY    ? BREAST BIOPSY Right   ? cataracts Bilateral   ? CHOLECYSTECTOMY  1998  ? COLONOSCOPY WITH PROPOFOL N/A 04/26/2014  ? Procedure: COLONOSCOPY WITH PROPOFOL;  Surgeon: Corbin Ade, MD;  [facility]: [facility];  Service: Endoscopy;  Laterality: N/A;  0939 in cecum, total withdrawal time, 10 min  ? LUMBAR DISC SURGERY  1980's  ? LUMBAR LAMINECTOMY/DECOMPRESSION MICRODISCECTOMY N/A 05/10/2017  ? Procedure: L3-4 DECOMPRESSION, RIGHT MICRODISCECTOMY;  Surgeon: [name], MD;  Location: [facility] OR;  Service: Orthopedics;  Laterality: N/A;  ? TOTAL ABDOMINAL HYSTERECTOMY W/ BILATERAL SALPINGOOPHORECTOMY    ? ?Allergies  ?Allergen Reactions  ? Sulfonamide Derivatives Shortness Of Breath and Rash  ?  Rash on chest  ? Adhesive [Tape]   ?  Creates skin tears  ? ? ? ?Objective/Physical Exam ?Neurovascular status intact.  Skin incisions appear to be well coapted with  staples intact.  JP drain is intact as well.   No sign of infectious process noted. No dehiscence. No active bleeding noted. Moderate edema noted to the surgical extremity. ? ?Radiographic Exam:  ?Orthopedic hardware and osteotomies sites appear to be stable with routine healing.  Compression staples across the TN and CC joint appears stable and intact.  Subtalar screw stable and intact.  Well-healing surgical foot ? ?Assessment: ?1. s/p triple arthrodesis left. DOS: 07/24/2021 ? ? ?Plan of Care:  ?1. Patient was evaluated. X-rays reviewed ?2.  Dressings changed today.  JP drain removed as well.  Dressings clean dry intact x1 week ?3.  Continue strict nonweightbearing to the surgical extremity in a cam boot using the wheelchair and walker ?4.  Continue assistance with [facility].  Their assistance is greatly appreciated ?5.  Return to clinic in 1 week ? ? ?Felecia Shelling, DPM ?[facility] ? ?Dr. Felecia Shelling, DPM  ?  ?[address]                                    ?[place], [place] [address]                ?Office [phone]  ?Fax [phone] ? ? ? ? ? ?

## 2021-08-06 ENCOUNTER — Encounter: Payer: Medicare PPO | Admitting: Podiatry

## 2021-08-13 ENCOUNTER — Ambulatory Visit (INDEPENDENT_AMBULATORY_CARE_PROVIDER_SITE_OTHER): Payer: Medicare PPO | Admitting: Podiatry

## 2021-08-13 DIAGNOSIS — Z9889 Other specified postprocedural states: Secondary | ICD-10-CM

## 2021-08-13 NOTE — Progress Notes (Signed)
   Subjective:  Patient presents today status post triple arthrodesis left. DOS: 07/24/2021.  Overall the patient is doing very well.  She says that the pain is very tolerable.  She has been nonweightbearing as instructed and she has her home health aide with her today  Past Medical History:  Diagnosis Date   Anxiety    Arthritis    hands, back    Chronic back pain    Depression    Diverticulosis    Fatty liver    Fibromyalgia    GERD (gastroesophageal reflux disease) 2006   EGD by Dr. Arnoldo Morale, reports that she has had reflux quite markedly in the past but its improved since taking medicine    Headache    use to have migraines when she was younger     Hemorrhage after delivery of fetus    Hyperglycemia    Hypertension    reports that she takes for diuretic    Hypothyroid    Pre-diabetes    S/P colonoscopy 2006   Dr. Arnoldo Morale     Past Surgical History:  Procedure Laterality Date   APPENDECTOMY     BREAST BIOPSY Right    cataracts Bilateral    CHOLECYSTECTOMY  1998   COLONOSCOPY WITH PROPOFOL N/A 04/26/2014   Procedure: COLONOSCOPY WITH PROPOFOL;  Surgeon: Daneil Dolin, MD;  [facility];  Service: Endoscopy;  Laterality: N/A;  0939 in cecum, total withdrawal time, 10 min   LUMBAR DISC SURGERY  1980's   LUMBAR LAMINECTOMY/DECOMPRESSION MICRODISCECTOMY N/A 05/10/2017   Procedure: L3-4 DECOMPRESSION, RIGHT MICRODISCECTOMY;  Surgeon: Marybelle Killings, MD;  Location: [place];  Service: Orthopedics;  Laterality: N/A;   TOTAL ABDOMINAL HYSTERECTOMY W/ BILATERAL SALPINGOOPHORECTOMY     Allergies  Allergen Reactions   Sulfonamide Derivatives Shortness Of Breath and Rash    Rash on chest   Adhesive [Tape]     Creates skin tears     Objective/Physical Exam Neurovascular status intact.  Skin incisions appear to be well coapted with  staples intact.  Minimal edema noted.  Overall this is a well-healing surgical foot  Radiographic Exam LT foot 07/30/2021:  Orthopedic hardware and  osteotomies sites appear to be stable with routine healing.  Compression staples across the TN and CC joint appears stable and intact.  Subtalar screw stable and intact.  Well-healing surgical foot  Assessment: 1. s/p triple arthrodesis left. DOS: 07/24/2021   Plan of Care:  1. Patient was evaluated.  2.  Overall the patient is doing very well.  Staples were removed today 3.  Continue strict nonweightbearing to the surgical extremity 4.  Ace wrap provided.  Recommend Ace wrap daily with a cam boot 5.  Return to clinic in 4 weeks for follow-up x-ray and to initiate possible physical therapy and partial weightbearing   Edrick Kins, DPM [facility]  Dr. Edrick Kins, DPM    [address], [place] [address]                Office [phone]  Fax [phone]

## 2021-08-20 ENCOUNTER — Encounter: Payer: Medicare PPO | Admitting: Podiatry

## 2021-09-01 ENCOUNTER — Telehealth: Payer: Self-pay | Admitting: *Deleted

## 2021-09-01 NOTE — Telephone Encounter (Signed)
Ann, OT with [employer] is wanting to know if patient can remove her boot at night per her request.Please advise.

## 2021-09-01 NOTE — Telephone Encounter (Signed)
Yes.  Okay to remove the boot at night.  Please notify patient.  Thanks, Dr. Logan Bores

## 2021-09-01 NOTE — Telephone Encounter (Signed)
Called Ann to notify that patient may remove the boot at night.

## 2021-09-10 ENCOUNTER — Ambulatory Visit (INDEPENDENT_AMBULATORY_CARE_PROVIDER_SITE_OTHER): Payer: Medicare PPO

## 2021-09-10 ENCOUNTER — Ambulatory Visit (INDEPENDENT_AMBULATORY_CARE_PROVIDER_SITE_OTHER): Payer: Medicare PPO | Admitting: Podiatry

## 2021-09-10 DIAGNOSIS — Z9889 Other specified postprocedural states: Secondary | ICD-10-CM

## 2021-09-10 NOTE — Progress Notes (Signed)
   Subjective:  Patient presents today status post triple arthrodesis left. DOS: 07/24/2021.  Patient continues to do very well.  No pain.  She presents for further treatment and evaluation  Past Medical History:  Diagnosis Date   Anxiety    Arthritis    hands, back    Chronic back pain    Depression    Diverticulosis    Fatty liver    Fibromyalgia    GERD (gastroesophageal reflux disease) 2006   EGD by Dr. Lovell Sheehan, reports that she has had reflux quite markedly in the past but its improved since taking medicine    Headache    use to have migraines when she was younger     Hemorrhage after delivery of fetus    Hyperglycemia    Hypertension    reports that she takes for diuretic    Hypothyroid    Pre-diabetes    S/P colonoscopy 2006   Dr. Lovell Sheehan     Past Surgical History:  Procedure Laterality Date   APPENDECTOMY     BREAST BIOPSY Right    cataracts Bilateral    CHOLECYSTECTOMY  1998   COLONOSCOPY WITH PROPOFOL N/A 04/26/2014   Procedure: COLONOSCOPY WITH PROPOFOL;  Surgeon: Corbin Ade, MD;  [facility];  Service: Endoscopy;  Laterality: N/A;  0939 in cecum, total withdrawal time, 10 min   LUMBAR DISC SURGERY  1980's   LUMBAR LAMINECTOMY/DECOMPRESSION MICRODISCECTOMY N/A 05/10/2017   Procedure: L3-4 DECOMPRESSION, RIGHT MICRODISCECTOMY;  Surgeon: [name], MD;  Location: [facility] OR;  Service: Orthopedics;  Laterality: N/A;   TOTAL ABDOMINAL HYSTERECTOMY W/ BILATERAL SALPINGOOPHORECTOMY     Allergies  Allergen Reactions   Sulfonamide Derivatives Shortness Of Breath and Rash    Rash on chest   Adhesive [Tape]     Creates skin tears     Objective/Physical Exam Neurovascular status intact.  Skin incisions appear to be well coapted and healed.  There continues to be some minimal edema noted.  Overall this is a well-healing surgical foot  Radiographic Exam LT foot 09/10/2021:  Orthopedic hardware and osteotomies sites appear to be stable with routine healing.   Compression staples across the TN and CC joint appears stable and intact.  Subtalar screw stable and intact.  Well-healing surgical foot  Assessment: 1. s/p triple arthrodesis left. DOS: 07/24/2021   Plan of Care:  1. Patient was evaluated.  2.  Patient continues to do well. 3.  X-rays are stable and intact.  We will allow the patient to be partial weightbearing in the walker using the cam boot.  Also allow the patient to perform passive range of motion exercises when nonweightbearing and sitting in a chair.  Physical therapy orders were placed to [facility]: [facility]. 336) [phone] 4.  Compression ankle sleeves dispensed.  Wear daily with the cam boot 5.  Return to clinic in 4 weeks for follow-up x-ray  Felecia Shelling, DPM [facility]  Dr. Felecia Shelling, DPM    [address] [place], [place] [address]                Office [phone]  Fax [phone]

## 2021-09-12 ENCOUNTER — Telehealth: Payer: Self-pay | Admitting: *Deleted

## 2021-09-15 NOTE — Telephone Encounter (Signed)
Called and spoke with [name] giving her the physician's recommendations for weightbearing status, verbalized understanding.

## 2021-09-18 ENCOUNTER — Telehealth: Payer: Self-pay | Admitting: *Deleted

## 2021-09-18 NOTE — Telephone Encounter (Signed)
Faxed PT referral to [facility],  received confirmation 09/18/21.

## 2021-09-18 NOTE — Telephone Encounter (Signed)
-----   Message from Felecia Shelling, DPM sent at 09/10/2021  3:17 PM EDT ----- Regarding: Physical therapy orders [name],   I just saw this patient today.  She is requesting that we fax physical therapy orders to [facility]. Fax# [phone].   I placed the order in the patient's chart.  Could you please print the order off and fax it.  Thanks, Dr. Logan Bores

## 2021-09-24 ENCOUNTER — Other Ambulatory Visit: Payer: Self-pay

## 2021-09-24 ENCOUNTER — Other Ambulatory Visit: Payer: Medicare PPO

## 2021-10-08 ENCOUNTER — Telehealth: Payer: Self-pay

## 2021-10-08 ENCOUNTER — Ambulatory Visit (INDEPENDENT_AMBULATORY_CARE_PROVIDER_SITE_OTHER): Payer: Medicare PPO

## 2021-10-08 ENCOUNTER — Ambulatory Visit (INDEPENDENT_AMBULATORY_CARE_PROVIDER_SITE_OTHER): Payer: Medicare PPO | Admitting: Podiatry

## 2021-10-08 DIAGNOSIS — Z9889 Other specified postprocedural states: Secondary | ICD-10-CM | POA: Diagnosis not present

## 2021-10-08 NOTE — Telephone Encounter (Signed)
Continue physical therapy until I see her again in 6 weeks. She can begin to transition off of the walker as she builds strength to the foot. Physical therapy will help her wean off of the Walker. Please let patient know. Thanks, Dr. Logan Bores

## 2021-10-08 NOTE — Progress Notes (Addendum)
   Subjective:  Patient presents today status post triple arthrodesis left. DOS: 07/24/2021.  Patient continues to do very well.  No pain.  She presents for further treatment and evaluation  Past Medical History:  Diagnosis Date   Anxiety    Arthritis    hands, back    Chronic back pain    Depression    Diverticulosis    Fatty liver    Fibromyalgia    GERD (gastroesophageal reflux disease) 2006   EGD by Dr. Lovell Sheehan, reports that she has had reflux quite markedly in the past but its improved since taking medicine    Headache    use to have migraines when she was younger     Hemorrhage after delivery of fetus    Hyperglycemia    Hypertension    reports that she takes for diuretic    Hypothyroid    Pre-diabetes    S/P colonoscopy 2006   Dr. Lovell Sheehan     Past Surgical History:  Procedure Laterality Date   APPENDECTOMY     BREAST BIOPSY Right    cataracts Bilateral    CHOLECYSTECTOMY  1998   COLONOSCOPY WITH PROPOFOL N/A 04/26/2014   Procedure: COLONOSCOPY WITH PROPOFOL;  Surgeon: Corbin Ade, MD;  [facility]: [facility];  Service: Endoscopy;  Laterality: N/A;  0939 in cecum, total withdrawal time, 10 min   LUMBAR DISC SURGERY  1980's   LUMBAR LAMINECTOMY/DECOMPRESSION MICRODISCECTOMY N/A 05/10/2017   Procedure: L3-4 DECOMPRESSION, RIGHT MICRODISCECTOMY;  Surgeon: [name], MD;  Location: [facility] OR;  Service: Orthopedics;  Laterality: N/A;   TOTAL ABDOMINAL HYSTERECTOMY W/ BILATERAL SALPINGOOPHORECTOMY     Allergies  Allergen Reactions   Sulfonamide Derivatives Shortness Of Breath and Rash    Rash on chest   Adhesive [Tape]     Creates skin tears     Objective/Physical Exam Neurovascular status intact.  Skin incisions appear to be well coapted and healed.  There continues to be some minimal edema noted.  Overall this is a well-healing surgical foot  Radiographic Exam LT foot 10/08/2021:  Orthopedic hardware and osteotomies sites appear to be stable with routine healing.   Compression staples across the TN and CC joint appears stable and intact.  Subtalar screw stable and intact.  Well-healing surgical foot  Assessment: 1. s/p triple arthrodesis left. DOS: 07/24/2021   Plan of Care:  1. Patient was evaluated.  2.  Patient continues to do well. 3.  X-rays are stable and intact.  Patient may now begin to transition out of the cam boot into good supportive sneakers and shoes.  Patient is almost 11 weeks postop.  Full weightbearing as tolerated.  New physical therapy orders were placed to [facility]: [facility]. [phone] 4.  Compression ankle sleeves dispensed.  Continue. 5.  Return to clinic 6 weeks  Felecia Shelling, DPM [facility]  Dr. Felecia Shelling, DPM    [address] [place], [place] [address]                Office [phone]  Fax [phone]

## 2021-10-08 NOTE — Telephone Encounter (Signed)
Patient wants to know how long will she need to do physical therapy? Also wants to know how long she should use the walker? Please advise

## 2021-10-09 ENCOUNTER — Telehealth: Payer: Self-pay | Admitting: *Deleted

## 2021-10-09 NOTE — Telephone Encounter (Signed)
[facility] is calling to request detail orders, if patient is out of the boot completely, has her weight bearing status changed from previous visit.(09/12/21) They will need this attached to the order being sent.

## 2021-10-10 NOTE — Telephone Encounter (Signed)
The orders are in the patient's note.  Full weightbearing.  Please fax last office visit on 10/08/2021 to [facility].  Thanks, Dr. Logan Bores

## 2021-11-10 ENCOUNTER — Telehealth: Payer: Self-pay

## 2021-11-10 NOTE — Telephone Encounter (Signed)
Yes, patient can discontinue the walking boot. Please notify patient. Will see her next scheduled appt. - Dr. Logan Bores

## 2021-11-12 NOTE — Telephone Encounter (Signed)
Patient has been given instructions thru voice message.

## 2021-11-19 ENCOUNTER — Encounter: Payer: Medicare PPO | Admitting: Podiatry

## 2021-11-26 ENCOUNTER — Ambulatory Visit (INDEPENDENT_AMBULATORY_CARE_PROVIDER_SITE_OTHER): Payer: Medicare PPO

## 2021-11-26 ENCOUNTER — Ambulatory Visit (INDEPENDENT_AMBULATORY_CARE_PROVIDER_SITE_OTHER): Payer: Medicare PPO | Admitting: Podiatry

## 2021-11-26 DIAGNOSIS — Z9889 Other specified postprocedural states: Secondary | ICD-10-CM

## 2021-11-26 DIAGNOSIS — M79672 Pain in left foot: Secondary | ICD-10-CM | POA: Diagnosis not present

## 2021-11-26 NOTE — Progress Notes (Signed)
Chief Complaint  Patient presents with   Routine Post Op    Patient came in today for left foot, still having pain and swelling, Rate of pain 4 out of 10, TX: brace and walker, P.T.     Subjective:  Patient presents today status post triple arthrodesis left. DOS: 07/24/2021.  Patient has noticed recently that with increased activity she does have some swelling to the foot and ankle and would like to have it evaluated today.  Past Medical History:  Diagnosis Date   Anxiety    Arthritis    hands, back    Chronic back pain    Depression    Diverticulosis    Fatty liver    Fibromyalgia    GERD (gastroesophageal reflux disease) 2006   EGD by Dr. Lovell Sheehan, reports that she has had reflux quite markedly in the past but its improved since taking medicine    Headache    use to have migraines when she was younger     Hemorrhage after delivery of fetus    Hyperglycemia    Hypertension    reports that she takes for diuretic    Hypothyroid    Pre-diabetes    S/P colonoscopy 2006   Dr. Lovell Sheehan     Past Surgical History:  Procedure Laterality Date   APPENDECTOMY     BREAST BIOPSY Right    cataracts Bilateral    CHOLECYSTECTOMY  1998   COLONOSCOPY WITH PROPOFOL N/A 04/26/2014   Procedure: COLONOSCOPY WITH PROPOFOL;  Surgeon: Corbin Ade, MD;  [facility]: [facility];  Service: Endoscopy;  Laterality: N/A;  0939 in cecum, total withdrawal time, 10 min   LUMBAR DISC SURGERY  1980's   LUMBAR LAMINECTOMY/DECOMPRESSION MICRODISCECTOMY N/A 05/10/2017   Procedure: L3-4 DECOMPRESSION, RIGHT MICRODISCECTOMY;  Surgeon: [name], MD;  Location: [facility] OR;  Service: Orthopedics;  Laterality: N/A;   TOTAL ABDOMINAL HYSTERECTOMY W/ BILATERAL SALPINGOOPHORECTOMY     Allergies  Allergen Reactions   Sulfonamide Derivatives Shortness Of Breath and Rash    Rash on chest   Adhesive [Tape]     Creates skin tears     Objective/Physical Exam Neurovascular status intact.  Skin incisions appear to be well  coapted and healed.  Moderate edema noted to the left ankle.  No significant tenderness or pain with palpation throughout the foot and ankle.  Negative for any significant crepitus or range of motion to the triple arthrodesis sites  Radiographic Exam LT foot 11/26/2021:  X-ray stable.  No significant change since last prior x-rays.  Orthopedic hardware and osteotomies sites appear to be stable with routine healing.  Compression staples across the TN and CC joint appears stable and intact.  Subtalar screw stable and intact.  Well-healing surgical foot  Assessment: 1. s/p triple arthrodesis left. DOS: 07/24/2021   Plan of Care:  1. Patient was evaluated.  2.  Continue full weightbearing as tolerated in good supportive shoes and sneakers.  New physical therapy orders were placed to [facility]: [facility]. 336) [phone] 4.  Continue compression ankle sleeves 5.  Explained to the patient that she is still healing given her age and the slow progression of the triple arthrodesis.  Overall I do believe she is doing very well however.  Return to clinic 3 months for follow-up evaluation and x-ray  Felecia Shelling, DPM [facility]  Dr. Felecia Shelling, DPM    [address]  [place], [place] [address]                Office [phone]  Fax [phone]   

## 2022-03-04 ENCOUNTER — Ambulatory Visit (INDEPENDENT_AMBULATORY_CARE_PROVIDER_SITE_OTHER): Payer: Medicare PPO

## 2022-03-04 ENCOUNTER — Ambulatory Visit: Payer: Medicare PPO | Admitting: Podiatry

## 2022-03-04 DIAGNOSIS — M19072 Primary osteoarthritis, left ankle and foot: Secondary | ICD-10-CM

## 2022-03-04 NOTE — Progress Notes (Signed)
Chief Complaint  Patient presents with   Post-op Follow-up    Patient is here for os op follow-up left foot, DOS 07/24/21    Subjective:  Patient presents today status post triple arthrodesis left. DOS: 07/24/2021.  Patient states that she continues to have some arch collapse to the medial longitudinal arch of the surgical foot.  She believes that the surgical area has healed nicely however she continues to have pain throughout the midfoot.  Presenting for further treatment and evaluation  Past Medical History:  Diagnosis Date   Anxiety    Arthritis    hands, back    Chronic back pain    Depression    Diverticulosis    Fatty liver    Fibromyalgia    GERD (gastroesophageal reflux disease) 2006   EGD by Dr. Lovell Sheehan, reports that she has had reflux quite markedly in the past but its improved since taking medicine    Headache    use to have migraines when she was younger     Hemorrhage after delivery of fetus    Hyperglycemia    Hypertension    reports that she takes for diuretic    Hypothyroid    Pre-diabetes    S/P colonoscopy 2006   Dr. Lovell Sheehan     Past Surgical History:  Procedure Laterality Date   APPENDECTOMY     BREAST BIOPSY Right    cataracts Bilateral    CHOLECYSTECTOMY  1998   COLONOSCOPY WITH PROPOFOL N/A 04/26/2014   Procedure: COLONOSCOPY WITH PROPOFOL;  Surgeon: Corbin Ade, MD;  Location: [facility];  Service: Endoscopy;  Laterality: N/A;  0939 in cecum, total withdrawal time, 10 min   LUMBAR DISC SURGERY  1980's   LUMBAR LAMINECTOMY/DECOMPRESSION MICRODISCECTOMY N/A 05/10/2017   Procedure: L3-4 DECOMPRESSION, RIGHT MICRODISCECTOMY;  Surgeon: [name], MD;  Location: [facility] OR;  Service: Orthopedics;  Laterality: N/A;   TOTAL ABDOMINAL HYSTERECTOMY W/ BILATERAL SALPINGOOPHORECTOMY     Allergies  Allergen Reactions   Sulfonamide Derivatives Shortness Of Breath and Rash    Rash on chest   Adhesive [Tape]     Creates skin tears     Objective/Physical  Exam Neurovascular status intact.  Skin incisions healed.  There continues to be chronic moderate edema noted to the left ankle.  No significant tenderness or pain with palpation throughout the foot and ankle.  There is medial longitudinal arch collapse along the midfoot with weightbearing.  Lateral deviation of the forefoot also noted beginning at the midtarsal joint.  Radiographic Exam LT foot 03/04/2022:  X-ray stable.  Arthrodesis sites appear healed with good placement and stability of the orthopedic staples and subtalar screw.  There does appear to be lateral deviation of the forefoot at the level of the metatarsals just distal to the triple arthrodesis site.  There is also collapse of the medial longitudinal arch on lateral view beginning just distal to the talonavicular arthrodesis at the level of the navicular cuneiform joints.  Assessment: 1. s/p triple arthrodesis left. DOS: 07/24/2021 2.  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 Patient was evaluated.  2.  Continue good supportive shoes and sneakers 3.  Continue compression daily 4.  Advised against going barefoot 5.  Return to clinic in 4 weeks.  During this time will discuss patient's case with other physicians at our monthly rounds to discuss different treatment options both conservative and/or surgical  Felecia Shelling, DPM [facility]  Dr. Edrick Kins, DPM    [address], [place] [address]                Office [phone]  Fax [phone]

## 2022-04-08 ENCOUNTER — Ambulatory Visit: Payer: Medicare PPO | Admitting: Podiatry

## 2022-04-08 VITALS — BP 134/75 | HR 76

## 2022-04-08 DIAGNOSIS — M19072 Primary osteoarthritis, left ankle and foot: Secondary | ICD-10-CM

## 2022-04-08 NOTE — Progress Notes (Signed)
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 DOS: 07/24/2021.  Patient states that although her midfoot has collapse she is tolerating it well.  She only has a moderate pain if she is on her feet for too long.  She presents today with her daughter.  Past Medical History:  Diagnosis Date   Anxiety    Arthritis    hands, back    Chronic back pain    Depression    Diverticulosis    Fatty liver    Fibromyalgia    GERD (gastroesophageal reflux disease) 2006   EGD by Dr. Arnoldo Morale, reports that she has had reflux quite markedly in the past but its improved since taking medicine    Headache    use to have migraines when she was younger     Hemorrhage after delivery of fetus    Hyperglycemia    Hypertension    reports that she takes for diuretic    Hypothyroid    Pre-diabetes    S/P colonoscopy 2006   Dr. Arnoldo Morale     Past Surgical History:  Procedure Laterality Date   APPENDECTOMY     BREAST BIOPSY Right    cataracts Bilateral    CHOLECYSTECTOMY  1998   COLONOSCOPY WITH PROPOFOL N/A 04/26/2014   Procedure: COLONOSCOPY WITH PROPOFOL;  Surgeon: Daneil Dolin, MD;  Location: [facility];  Service: Endoscopy;  Laterality: N/A;  0939 in cecum, total withdrawal time, 10 min   LUMBAR DISC SURGERY  1980's   LUMBAR LAMINECTOMY/DECOMPRESSION MICRODISCECTOMY N/A 05/10/2017   Procedure: L3-4 DECOMPRESSION, RIGHT MICRODISCECTOMY;  Surgeon: Marybelle Killings, MD;  Location: [place];  Service: Orthopedics;  Laterality: N/A;   TOTAL ABDOMINAL HYSTERECTOMY W/ BILATERAL SALPINGOOPHORECTOMY     Allergies  Allergen Reactions   Sulfonamide Derivatives Shortness Of Breath and Rash    Rash on chest   Adhesive [Tape]     Creates skin tears     Objective/Physical Exam Neurovascular status intact.  Skin incisions healed.  There continues to be  chronic moderate edema noted to the left ankle.  No significant tenderness or pain with palpation throughout the foot and ankle.  There is medial longitudinal arch collapse along the midfoot with weightbearing.  Lateral deviation of the forefoot also noted beginning at the midtarsal joint.  Radiographic Exam LT foot 03/04/2022:  X-ray stable.  Arthrodesis sites appear healed with good placement and stability of the orthopedic staples and subtalar screw.  There does appear to be lateral deviation of the forefoot at the level of the metatarsals just distal to the triple arthrodesis site.  There is also collapse of the medial longitudinal arch on lateral view beginning just distal to the talonavicular arthrodesis at the level of the navicular cuneiform joints.  Assessment: 1. s/p triple arthrodesis left. DOS: 07/24/2021 2.  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 Patient was evaluated.  2.  Continue good supportive shoes and sneakers 3.  Today we had a very detailed discussion regarding conservative versus surgical management.  Given the patient's age and history I would recommend pursuing conservative options.  Patient and daughter both agree. 4.  Appointment with orthotics department for custom molded diabetic type accommodative insoles 5.  Return to clinic for orthotics fitting  Edrick Kins, DPM  [facility]  Dr. Edrick Kins, DPM    [address], [place] [address]                Office [phone]  Fax [phone]

## 2022-04-15 ENCOUNTER — Other Ambulatory Visit: Payer: Medicare PPO

## 2022-04-19 NOTE — Progress Notes (Signed)
Patient presents today to be casted for custom molded orthotics. Dr Amalia Hailey is the treating physician.  Impression foam cast was taken. ABN signed.  Patient info-  Shoe size: 9  Shoe style: ATHLETIC  Height: 5 FT 2 IN  Weight: 159 LBS  Insurance: [employer]    Patient will be notified once orthotics arrive in office and reappoint for fitting at that time.

## 2022-04-20 ENCOUNTER — Ambulatory Visit (INDEPENDENT_AMBULATORY_CARE_PROVIDER_SITE_OTHER): Payer: Medicare PPO

## 2022-04-20 DIAGNOSIS — M19072 Primary osteoarthritis, left ankle and foot: Secondary | ICD-10-CM

## 2022-06-15 ENCOUNTER — Other Ambulatory Visit: Payer: Medicare PPO

## 2023-05-05 ENCOUNTER — Other Ambulatory Visit: Payer: Self-pay | Admitting: Medical Genetics

## 2023-09-21 DIAGNOSIS — R7303 Prediabetes: Secondary | ICD-10-CM | POA: Diagnosis not present

## 2023-09-21 DIAGNOSIS — I10 Essential (primary) hypertension: Secondary | ICD-10-CM | POA: Diagnosis not present

## 2023-09-21 DIAGNOSIS — E039 Hypothyroidism, unspecified: Secondary | ICD-10-CM | POA: Diagnosis not present

## 2023-10-08 DIAGNOSIS — M797 Fibromyalgia: Secondary | ICD-10-CM | POA: Diagnosis not present

## 2023-10-08 DIAGNOSIS — E039 Hypothyroidism, unspecified: Secondary | ICD-10-CM | POA: Diagnosis not present

## 2023-10-08 DIAGNOSIS — B009 Herpesviral infection, unspecified: Secondary | ICD-10-CM | POA: Diagnosis not present

## 2023-10-08 DIAGNOSIS — I10 Essential (primary) hypertension: Secondary | ICD-10-CM | POA: Diagnosis not present

## 2023-10-08 DIAGNOSIS — E781 Pure hyperglyceridemia: Secondary | ICD-10-CM | POA: Diagnosis not present

## 2023-10-08 DIAGNOSIS — K219 Gastro-esophageal reflux disease without esophagitis: Secondary | ICD-10-CM | POA: Diagnosis not present

## 2023-10-08 DIAGNOSIS — F418 Other specified anxiety disorders: Secondary | ICD-10-CM | POA: Diagnosis not present

## 2023-10-08 DIAGNOSIS — Z0001 Encounter for general adult medical examination with abnormal findings: Secondary | ICD-10-CM | POA: Diagnosis not present

## 2023-10-08 DIAGNOSIS — R7303 Prediabetes: Secondary | ICD-10-CM | POA: Diagnosis not present

## 2023-11-10 ENCOUNTER — Other Ambulatory Visit: Payer: Self-pay

## 2023-11-10 ENCOUNTER — Encounter (HOSPITAL_COMMUNITY): Payer: Self-pay | Admitting: Emergency Medicine

## 2023-11-10 ENCOUNTER — Inpatient Hospital Stay (HOSPITAL_COMMUNITY)
Admission: EM | Admit: 2023-11-10 | Discharge: 2023-11-15 | DRG: 392 | Disposition: A | Discharge status: Skilled Nursing Facility | Attending: Internal Medicine | Admitting: Internal Medicine

## 2023-11-10 DIAGNOSIS — K219 Gastro-esophageal reflux disease without esophagitis: Secondary | ICD-10-CM | POA: Diagnosis present

## 2023-11-10 DIAGNOSIS — M19041 Primary osteoarthritis, right hand: Secondary | ICD-10-CM | POA: Diagnosis present

## 2023-11-10 DIAGNOSIS — K760 Fatty (change of) liver, not elsewhere classified: Secondary | ICD-10-CM | POA: Diagnosis not present

## 2023-11-10 DIAGNOSIS — Z8249 Family history of ischemic heart disease and other diseases of the circulatory system: Secondary | ICD-10-CM

## 2023-11-10 DIAGNOSIS — K551 Chronic vascular disorders of intestine: Secondary | ICD-10-CM | POA: Diagnosis not present

## 2023-11-10 DIAGNOSIS — Z66 Do not resuscitate: Secondary | ICD-10-CM | POA: Diagnosis not present

## 2023-11-10 DIAGNOSIS — M797 Fibromyalgia: Secondary | ICD-10-CM | POA: Diagnosis present

## 2023-11-10 DIAGNOSIS — Z79890 Hormone replacement therapy (postmenopausal): Secondary | ICD-10-CM

## 2023-11-10 DIAGNOSIS — R1111 Vomiting without nausea: Secondary | ICD-10-CM | POA: Diagnosis not present

## 2023-11-10 DIAGNOSIS — Z6830 Body mass index (BMI) 30.0-30.9, adult: Secondary | ICD-10-CM | POA: Diagnosis not present

## 2023-11-10 DIAGNOSIS — I152 Hypertension secondary to endocrine disorders: Secondary | ICD-10-CM | POA: Diagnosis not present

## 2023-11-10 DIAGNOSIS — G8929 Other chronic pain: Secondary | ICD-10-CM | POA: Diagnosis present

## 2023-11-10 DIAGNOSIS — R739 Hyperglycemia, unspecified: Secondary | ICD-10-CM | POA: Diagnosis present

## 2023-11-10 DIAGNOSIS — E876 Hypokalemia: Secondary | ICD-10-CM | POA: Diagnosis present

## 2023-11-10 DIAGNOSIS — M549 Dorsalgia, unspecified: Secondary | ICD-10-CM | POA: Diagnosis present

## 2023-11-10 DIAGNOSIS — A084 Viral intestinal infection, unspecified: Secondary | ICD-10-CM | POA: Diagnosis not present

## 2023-11-10 DIAGNOSIS — M479 Spondylosis, unspecified: Secondary | ICD-10-CM | POA: Diagnosis present

## 2023-11-10 DIAGNOSIS — R4182 Altered mental status, unspecified: Secondary | ICD-10-CM | POA: Diagnosis present

## 2023-11-10 DIAGNOSIS — Z7984 Long term (current) use of oral hypoglycemic drugs: Secondary | ICD-10-CM

## 2023-11-10 DIAGNOSIS — E785 Hyperlipidemia, unspecified: Secondary | ICD-10-CM | POA: Diagnosis not present

## 2023-11-10 DIAGNOSIS — E782 Mixed hyperlipidemia: Secondary | ICD-10-CM | POA: Diagnosis present

## 2023-11-10 DIAGNOSIS — I10 Essential (primary) hypertension: Secondary | ICD-10-CM | POA: Diagnosis present

## 2023-11-10 DIAGNOSIS — E66811 Obesity, class 1: Secondary | ICD-10-CM | POA: Diagnosis present

## 2023-11-10 DIAGNOSIS — E1159 Type 2 diabetes mellitus with other circulatory complications: Secondary | ICD-10-CM | POA: Diagnosis not present

## 2023-11-10 DIAGNOSIS — M19042 Primary osteoarthritis, left hand: Secondary | ICD-10-CM | POA: Diagnosis present

## 2023-11-10 DIAGNOSIS — R410 Disorientation, unspecified: Secondary | ICD-10-CM | POA: Diagnosis not present

## 2023-11-10 DIAGNOSIS — R110 Nausea: Secondary | ICD-10-CM | POA: Diagnosis not present

## 2023-11-10 DIAGNOSIS — N390 Urinary tract infection, site not specified: Secondary | ICD-10-CM | POA: Diagnosis not present

## 2023-11-10 DIAGNOSIS — Z882 Allergy status to sulfonamides status: Secondary | ICD-10-CM

## 2023-11-10 DIAGNOSIS — Z79899 Other long term (current) drug therapy: Secondary | ICD-10-CM | POA: Diagnosis not present

## 2023-11-10 DIAGNOSIS — R112 Nausea with vomiting, unspecified: Secondary | ICD-10-CM | POA: Diagnosis present

## 2023-11-10 DIAGNOSIS — E039 Hypothyroidism, unspecified: Secondary | ICD-10-CM | POA: Diagnosis present

## 2023-11-10 DIAGNOSIS — Z7982 Long term (current) use of aspirin: Secondary | ICD-10-CM | POA: Diagnosis not present

## 2023-11-10 DIAGNOSIS — K5730 Diverticulosis of large intestine without perforation or abscess without bleeding: Secondary | ICD-10-CM | POA: Diagnosis not present

## 2023-11-10 DIAGNOSIS — K529 Noninfective gastroenteritis and colitis, unspecified: Secondary | ICD-10-CM | POA: Diagnosis not present

## 2023-11-10 DIAGNOSIS — R197 Diarrhea, unspecified: Secondary | ICD-10-CM | POA: Diagnosis not present

## 2023-11-10 DIAGNOSIS — E860 Dehydration: Secondary | ICD-10-CM | POA: Diagnosis present

## 2023-11-10 DIAGNOSIS — Z791 Long term (current) use of non-steroidal anti-inflammatories (NSAID): Secondary | ICD-10-CM

## 2023-11-10 DIAGNOSIS — R000 Tachycardia, unspecified: Secondary | ICD-10-CM | POA: Diagnosis not present

## 2023-11-10 DIAGNOSIS — K5909 Other constipation: Secondary | ICD-10-CM | POA: Diagnosis present

## 2023-11-10 DIAGNOSIS — R1032 Left lower quadrant pain: Secondary | ICD-10-CM | POA: Diagnosis not present

## 2023-11-10 DIAGNOSIS — Z9049 Acquired absence of other specified parts of digestive tract: Secondary | ICD-10-CM

## 2023-11-10 DIAGNOSIS — K449 Diaphragmatic hernia without obstruction or gangrene: Secondary | ICD-10-CM | POA: Diagnosis not present

## 2023-11-10 DIAGNOSIS — Z888 Allergy status to other drugs, medicaments and biological substances status: Secondary | ICD-10-CM

## 2023-11-10 LAB — COMPREHENSIVE METABOLIC PANEL WITH GFR
ALT: 123 U/L — ABNORMAL HIGH (ref 0–44)
AST: 127 U/L — ABNORMAL HIGH (ref 15–41)
Albumin: 4.4 g/dL (ref 3.5–5.0)
Alkaline Phosphatase: 85 U/L (ref 38–126)
Anion gap: 17 — ABNORMAL HIGH (ref 5–15)
BUN: 19 mg/dL (ref 8–23)
CO2: 19 mmol/L — ABNORMAL LOW (ref 22–32)
Calcium: 9.3 mg/dL (ref 8.9–10.3)
Chloride: 102 mmol/L (ref 98–111)
Creatinine, Ser: 0.76 mg/dL (ref 0.44–1.00)
GFR, Estimated: >60 mL/min (ref >60)
Glucose, Bld: 144 mg/dL — ABNORMAL HIGH (ref 70–99)
Potassium: 3.4 mmol/L — ABNORMAL LOW (ref 3.5–5.1)
Sodium: 138 mmol/L (ref 135–145)
Total Bilirubin: 1.2 mg/dL (ref 0.0–1.2)
Total Protein: 8.3 g/dL — ABNORMAL HIGH (ref 6.5–8.1)

## 2023-11-10 LAB — CBC
HCT: 45 % (ref 36.0–46.0)
Hemoglobin: 15.5 g/dL — ABNORMAL HIGH (ref 12.0–15.0)
MCH: 32.8 pg (ref 26.0–34.0)
MCHC: 34.4 g/dL (ref 30.0–36.0)
MCV: 95.1 fL (ref 80.0–100.0)
Platelets: 257 K/uL (ref 150–400)
RBC: 4.73 MIL/uL (ref 3.87–5.11)
RDW: 12.8 % (ref 11.5–15.5)
WBC: 11.3 K/uL — ABNORMAL HIGH (ref 4.0–10.5)
nRBC: 0 % (ref 0.0–0.2)

## 2023-11-10 LAB — URINALYSIS, ROUTINE W REFLEX MICROSCOPIC
Bacteria, UA: NONE SEEN
Bilirubin Urine: NEGATIVE
Glucose, UA: NEGATIVE mg/dL
Hgb urine dipstick: NEGATIVE
Ketones, ur: 20 mg/dL — AB
Leukocytes,Ua: NEGATIVE
Nitrite: NEGATIVE
Protein, ur: 100 mg/dL — AB
Specific Gravity, Urine: 1.01 (ref 1.005–1.030)
pH: 6 (ref 5.0–8.0)

## 2023-11-10 LAB — CBG MONITORING, ED: Glucose-Capillary: 110 mg/dL — ABNORMAL HIGH (ref 70–99)

## 2023-11-10 MED ORDER — SODIUM CHLORIDE 0.9 % IV BOLUS
1000.0000 mL | Freq: Once | INTRAVENOUS | Status: AC
  Administered 2023-11-10: 1000 mL via INTRAVENOUS

## 2023-11-10 MED ORDER — SODIUM CHLORIDE 0.9 % IV SOLN
2.0000 g | Freq: Once | INTRAVENOUS | Status: AC
  Administered 2023-11-10: 2 g via INTRAVENOUS
  Filled 2023-11-10: qty 20

## 2023-11-10 MED ORDER — ONDANSETRON HCL 4 MG/2ML IJ SOLN
4.0000 mg | Freq: Once | INTRAMUSCULAR | Status: AC
  Administered 2023-11-10: 4 mg via INTRAVENOUS
  Filled 2023-11-10: qty 2

## 2023-11-10 NOTE — ED Provider Notes (Signed)
 [place] EMERGENCY DEPARTMENT AT [facility] Provider Note   CSN: [id number] Arrival date & time: 11/10/23  1950     Patient presents with: Altered Mental Status   [name] is a 83 y.o. female.  {Add pertinent medical, surgical, social history, OB history to YEP:67052} Patient has a history of hypertension.  According to her daughter she has been more confused the last few days and has been vomiting.  She was seen at [facility] and diagnosed with a UTI.  She was sent home with p.o. antibiotics which she has not been able to keep down.  She had a CT scan of the abdomen that was unremarkable   Altered Mental Status      Prior to Admission medications   Medication Sig Start Date End Date Taking? Authorizing Provider  amLODipine  (NORVASC ) 10 MG tablet amlodipine  10 mg tablet  TAKE ONE TABLET BY MOUTH EVERY DAY    [provider]  aspirin  81 MG EC tablet aspirin  81 mg tablet,delayed release  Take 1 tablet every day by oral route.    [provider]  cephALEXin  (KEFLEX ) 500 MG capsule Take 1 capsule (500 mg total) by mouth 3 (three) times daily. 07/24/21   McDonald, Juliene SAUNDERS, DPM  ciprofloxacin  (CIPRO ) 500 MG tablet Take 1 tablet (500 mg total) by mouth 2 (two) times daily. 04/07/18   Barbarann Oneil BROCKS, MD  DULoxetine  (CYMBALTA ) 60 MG capsule Take 60 mg by mouth daily before breakfast.     [provider]  fenofibrate  (TRICOR ) 145 MG tablet Take 145 mg by mouth daily.    [provider]  hydrochlorothiazide  (MICROZIDE ) 12.5 MG capsule Take 12.5 mg by mouth daily.  02/22/17   [provider]  levothyroxine  (SYNTHROID , LEVOTHROID) 100 MCG tablet Take 100 mcg by mouth daily before breakfast.    [provider]  Lidocaine  4 % PTCH Apply 1 patch topically daily as needed (pain).    [provider]  Melatonin 10 MG TABS melatonin 10 mg tablet  Take 1 tablet every day by oral route as needed.    [provider]  meloxicam (MOBIC) 15 MG tablet  05/09/18   [provider]  omeprazole (PRILOSEC) 20 MG capsule Take 20 mg by mouth daily.     [provider]  oxyCODONE  (OXY IR/ROXICODONE ) 5 MG immediate release tablet TK 1 T PO Q 6 H PRN P 04/17/18   [provider]  oxyCODONE -acetaminophen  (PERCOCET) 5-325 MG tablet Take 1 tablet by mouth every 4 (four) hours as needed for severe pain. 07/24/21   McDonald, Juliene SAUNDERS, DPM  pregabalin  (LYRICA ) 100 MG capsule Take 1 capsule (100 mg total) by mouth 2 (two) times daily. 04/15/18   Barbarann Oneil BROCKS, MD  pregabalin  (LYRICA ) 200 MG capsule Take 200 mg by mouth 3 (three) times daily. 11/20/19   [provider]  triamcinolone cream (KENALOG) 0.1 % Apply topically. 06/10/21   [provider]  VALTREX 1 g tablet Take 2,000 mg by mouth 2 (two) times daily. 05/01/21   [provider]    Allergies: Sulfonamide derivatives and Adhesive [tape]    Review of Systems  Updated Vital Signs BP (!) 133/112 (BP Location: Right Arm)   Pulse 80   Temp 98 F (36.7 C)   Resp 18   Ht 5' 2 (1.575 m)   Wt 74.5 kg   SpO2 96%   BMI 30.04 kg/m   Physical Exam  (all labs ordered  are listed, but only abnormal results are displayed) Labs Reviewed  COMPREHENSIVE METABOLIC PANEL WITH GFR - Abnormal; Notable for the following components:      Result Value   Potassium 3.4 (*)    CO2 19 (*)    Glucose, Bld 144 (*)    Total Protein 8.3 (*)    AST 127 (*)    ALT 123 (*)    Anion gap 17 (*)    All other components within normal limits  CBC - Abnormal; Notable for the following components:   WBC 11.3 (*)    Hemoglobin 15.5 (*)    All other components within normal limits  URINE CULTURE  URINALYSIS, ROUTINE W REFLEX MICROSCOPIC  CBG MONITORING, ED    EKG: None  Radiology: No results found.  {Document cardiac monitor, telemetry assessment procedure when appropriate:32947} Procedures   Medications Ordered in the ED   cefTRIAXone  (ROCEPHIN ) 2 g in sodium chloride  0.9 % 100 mL IVPB (has no administration in time range)  sodium chloride  0.9 % bolus 1,000 mL (1,000 mLs Intravenous New Bag/Given 11/10/23 2056)  ondansetron  (ZOFRAN ) injection 4 mg (4 mg Intravenous Given 11/10/23 2049)      {Click here for ABCD2, HEART and other calculators REFRESH Note before signing:1}                              Medical Decision Making Amount and/or Complexity of Data Reviewed Labs: ordered.  Risk Prescription drug management. Decision regarding hospitalization.   Patient with persistent vomiting and urinary tract infection.  She will be given IV antibiotics and fluids and admitted to medicine  {Document critical care time when appropriate  Document review of labs and clinical decision tools ie CHADS2VASC2, etc  Document your independent review of radiology images and any outside records  Document your discussion with family members, caretakers and with consultants  Document social determinants of health affecting pt's care  Document your decision making why or why not admission, treatments were needed:32947:::1}   Final diagnoses:  Confusion  Lower urinary tract infectious disease    ED Discharge Orders     None

## 2023-11-10 NOTE — ED Triage Notes (Addendum)
 Pt from [facility] independent living with daughter.  Pt has been seen at [facility] as she has been vomiting since Monday.  Diagnosed with UTI.  Was sent home with po nausea meds and po anbx.  Pt has not been able to hold down any anbx and has thrown up immediately after each dose of ODT zofran .  Pt is increasingly altered and not herself.  Normally 100% alert and oriented per family and last night was wandering nude through the halls at [facility].  Pt is rambling and confused in triage.

## 2023-11-10 NOTE — H&P (Incomplete)
 History and Physical    Patient: [name] FMW:[id number] DOB: [birth year] DOA: 11/10/2023 DOS: the patient was seen and examined on 11/11/2023 PCP: Shona Norleen PEDLAR, MD  Patient coming from: ALF/ILF  Chief Complaint:  Chief Complaint  Patient presents with   Altered Mental Status   HPI: [name] is a 83 y.o. female with medical history significant of hypertension, hyperlipidemia, hypothyroidism, GERD who presents to the emergency department accompanied by daughter and son due to 2-day onset of vomiting, diarrhea, left lower abdominal pain.  Most of the history was obtained from daughter at bedside.  By history, patient lives at [facility] independent living facility and she was reported to develop altered mental status whereby she was walking nakedly in the hallway at the facility (unusual for her).  911 was activated and patient was taken to [facility] ED where she was diagnosed to have UTI.  Oral antibiotics and antiemetics were prescribed.  Unfortunately, on returning to the facility, patient continued to vomit and was unable to hold the antibiotics, so she was brought to this ED.  Diarrhea was watery in nature and last episode was this afternoon.  Vomiting was NBNB.  ED Course:  In the emergency department, BP was 133/112, other vital signs were within normal range.  Workup in ED showed normal CBC except WBC 11.3, hemoglobin 15.5.  BMP was normal except potassium 3.4, bicarb 19, blood glucose 144.  AST 127, ALT 123.  Urinalysis was positive for proteinuria and minimal ketonuria. She was treated with IV ceftriaxone , IV hydration was provided, Zofran  was given. [facility] was asked to admit patient.  Review of Systems: Review of systems as noted in the HPI. All other systems reviewed and are negative.   Past Medical History:  Diagnosis Date   Anxiety    Arthritis    hands, back    Chronic back pain    Depression    Diverticulosis    Fatty liver    Fibromyalgia    GERD  (gastroesophageal reflux disease) 2006   EGD by Dr. Mavis, reports that she has had reflux quite markedly in the past but its improved since taking medicine    Headache    use to have migraines when she was younger     Hemorrhage after delivery of fetus    Hyperglycemia    Hypertension    reports that she takes for diuretic    Hypothyroid    Intractable nausea and vomiting 11/10/2023   Pre-diabetes    S/P colonoscopy 2006   Dr. Mavis   Past Surgical History:  Procedure Laterality Date   APPENDECTOMY     BREAST BIOPSY Right    cataracts Bilateral    CHOLECYSTECTOMY  1998   COLONOSCOPY WITH PROPOFOL  N/A 04/26/2014   Procedure: COLONOSCOPY WITH PROPOFOL ;  Surgeon: Lamar CHRISTELLA Hollingshead, MD;  Location: [facility];  Service: Endoscopy;  Laterality: N/A;  0939 in cecum, total withdrawal time, 10 min   LUMBAR DISC SURGERY  1980's   LUMBAR LAMINECTOMY/DECOMPRESSION MICRODISCECTOMY N/A 05/10/2017   Procedure: L3-4 DECOMPRESSION, RIGHT MICRODISCECTOMY;  Surgeon: Barbarann Oneil BROCKS, MD;  Location: [facility] OR;  Service: Orthopedics;  Laterality: N/A;   TOTAL ABDOMINAL HYSTERECTOMY W/ BILATERAL SALPINGOOPHORECTOMY      Social History:  reports that she has never smoked. She has never used smokeless tobacco. She reports that she does not drink alcohol and does not use drugs.   Allergies  Allergen Reactions   Sulfonamide Derivatives Shortness Of Breath and Rash  Rash on chest   Adhesive [Tape]     Creates skin tears    Family History  Problem Relation Age of Onset   Brain cancer Father    Coronary artery disease Mother    Prostate cancer Brother    Cancer Sister        gyn   Lung cancer Brother    Seizures Son    Pancreatitis Daughter        ?gallstones   Colon cancer Neg Hx      Prior to Admission medications   Medication Sig Start Date End Date Taking? Authorizing Provider  amLODipine  (NORVASC ) 10 MG tablet amlodipine  10 mg tablet  TAKE ONE TABLET BY MOUTH EVERY DAY    [provider]  aspirin  81 MG EC tablet aspirin  81 mg tablet,delayed release  Take 1 tablet every day by oral route.    [provider]  cephALEXin  (KEFLEX ) 500 MG capsule Take 1 capsule (500 mg total) by mouth 3 (three) times daily. 07/24/21   McDonald, Juliene SAUNDERS, DPM  ciprofloxacin  (CIPRO ) 500 MG tablet Take 1 tablet (500 mg total) by mouth 2 (two) times daily. 04/07/18   Barbarann Oneil BROCKS, MD  DULoxetine  (CYMBALTA ) 60 MG capsule Take 60 mg by mouth daily before breakfast.     [provider]  fenofibrate  (TRICOR ) 145 MG tablet Take 145 mg by mouth daily.    [provider]  hydrochlorothiazide  (MICROZIDE ) 12.5 MG capsule Take 12.5 mg by mouth daily.  02/22/17   [provider]  levothyroxine  (SYNTHROID , LEVOTHROID) 100 MCG tablet Take 100 mcg by mouth daily before breakfast.    [provider]  Lidocaine  4 % PTCH Apply 1 patch topically daily as needed (pain).    [provider]  Melatonin 10 MG TABS melatonin 10 mg tablet  Take 1 tablet every day by oral route as needed.    [provider]  meloxicam (MOBIC) 15 MG tablet  05/09/18   [provider]  omeprazole (PRILOSEC) 20 MG capsule Take 20 mg by mouth daily.     [provider]  oxyCODONE  (OXY IR/ROXICODONE ) 5 MG immediate release tablet TK 1 T PO Q 6 H PRN P 04/17/18   [provider]  oxyCODONE -acetaminophen  (PERCOCET) 5-325 MG tablet Take 1 tablet by mouth every 4 (four) hours as needed for severe pain. 07/24/21   McDonald, Juliene SAUNDERS, DPM  pregabalin  (LYRICA ) 100 MG capsule Take 1 capsule (100 mg total) by mouth 2 (two) times daily. 04/15/18   Barbarann Oneil BROCKS, MD  pregabalin  (LYRICA ) 200 MG capsule Take 200 mg by mouth 3 (three) times daily. 11/20/19   [provider]  triamcinolone cream (KENALOG) 0.1 % Apply topically. 06/10/21   [provider]  VALTREX 1 g tablet Take 2,000 mg by mouth 2 (two) times daily. 05/01/21   [provider]    Physical  Exam: BP (!) 167/75   Pulse 68   Temp 98 F (36.7 C)   Resp 18   Ht 5' 2 (1.575 m)   Wt 74.5 kg   SpO2 98%   BMI 30.04 kg/m   General: 83 y.o. year-old female well developed well nourished in no acute distress.  Alert and oriented x3. HEENT: NCAT, EOMI, dry mucous membranes Neck: Supple, trachea medial Cardiovascular: Regular rate and rhythm with no rubs or gallops.  No thyromegaly or JVD noted.  No lower extremity edema. 2/4 pulses in all 4 extremities. Respiratory: Clear to auscultation with  no wheezes or rales. Good inspiratory effort. Abdomen: Soft, mild tenderness of lower abdominal quadrants without guarding.  Nondistended with normal bowel sounds x4 quadrants. Muskuloskeletal: No cyanosis, clubbing or edema noted bilaterally Neuro: CN II-XII intact, strength 5/5 x 4, sensation, reflexes intact Skin: Decreased skin turgor.  No ulcerative lesions noted or rashes Psychiatry: Judgement and insight appear normal. Mood is appropriate for condition and setting          Labs on Admission:  Basic Metabolic Panel: Recent Labs  Lab 11/10/23 2024  NA 138  K 3.4*  CL 102  CO2 19*  GLUCOSE 144*  BUN 19  CREATININE 0.76  CALCIUM 9.3   Liver Function Tests: Recent Labs  Lab 11/10/23 2024  AST 127*  ALT 123*  ALKPHOS 85  BILITOT 1.2  PROT 8.3*  ALBUMIN  4.4   No results for input(s): LIPASE, AMYLASE in the last 168 hours. No results for input(s): AMMONIA in the last 168 hours. CBC: Recent Labs  Lab 11/10/23 2024  WBC 11.3*  HGB 15.5*  HCT 45.0  MCV 95.1  PLT 257   Cardiac Enzymes: No results for input(s): CKTOTAL, CKMB, CKMBINDEX, TROPONINI in the last 168 hours.  BNP (last 3 results) No results for input(s): BNP in the last 8760 hours.  ProBNP (last 3 results) No results for input(s): PROBNP in the last 8760 hours.  CBG: Recent Labs  Lab 11/10/23 2254  GLUCAP 110*    Radiological Exams on Admission: No results found.  EKG: I  independently viewed the EKG done and my findings are as followed: Sinus tachycardia at 104 bpm  Assessment/Plan Present on Admission:  Intractable nausea and vomiting  Acquired hypothyroidism  GERD  Principal Problem:   Intractable nausea and vomiting Active Problems:   Acquired hypothyroidism   GERD   Dehydration   UTI (urinary tract infection)   Hyperglycemia   Obesity, Class I, BMI 30-34.9   Essential hypertension   Mixed hyperlipidemia  Intractable nausea and vomiting Dehydration Continue IV Zofran  p.r.n. Continue IV hydration Patient will be started on clear liquid diet in the morning with plan to advance as tolerated  UTI POA Continue IV ceftriaxone   Hypokalemia K+ 3.4, this will be replenished  Hyperglycemia possibly reactive CBG 144.  Patient without any history of T2DM Hemoglobin A1c will be checked  Obesity class I (BMI 30.04) Diet and lifestyle modification  Essential hypertension Continue amlodipine   Mixed hyperlipidemia Continue Tricor   Acquired hypothyroidism Continue Synthroid   GERD Continue PPI   DVT prophylaxis: Lovenox   Code Status: Full code  Family Communication: Daughter and son at bedside (all questions answered to satisfaction)  Consults: None  Severity of Illness: The appropriate patient status for this patient is INPATIENT. Inpatient status is judged to be reasonable and necessary in order to provide the required intensity of service to ensure the patient's safety. The patient's presenting symptoms, physical exam findings, and initial radiographic and laboratory data in the context of their chronic comorbidities is felt to place them at high risk for further clinical deterioration. Furthermore, it is not anticipated that the patient will be medically stable for discharge from the hospital within 2 midnights of admission.   * I certify that at the point of admission it is my clinical judgment that the patient will require  inpatient hospital care spanning beyond 2 midnights from the point of admission due to high intensity of service, high risk for further deterioration and high frequency of surveillance required.*  Author: Georgio Hattabaugh, DO 11/11/2023  1:02 AM  For on call review [email or web address].

## 2023-11-11 DIAGNOSIS — I10 Essential (primary) hypertension: Secondary | ICD-10-CM | POA: Insufficient documentation

## 2023-11-11 DIAGNOSIS — R112 Nausea with vomiting, unspecified: Secondary | ICD-10-CM | POA: Diagnosis not present

## 2023-11-11 DIAGNOSIS — E860 Dehydration: Secondary | ICD-10-CM | POA: Insufficient documentation

## 2023-11-11 DIAGNOSIS — R739 Hyperglycemia, unspecified: Secondary | ICD-10-CM | POA: Insufficient documentation

## 2023-11-11 DIAGNOSIS — E782 Mixed hyperlipidemia: Secondary | ICD-10-CM | POA: Insufficient documentation

## 2023-11-11 DIAGNOSIS — E66811 Obesity, class 1: Secondary | ICD-10-CM | POA: Insufficient documentation

## 2023-11-11 DIAGNOSIS — N390 Urinary tract infection, site not specified: Secondary | ICD-10-CM | POA: Insufficient documentation

## 2023-11-11 LAB — COMPREHENSIVE METABOLIC PANEL WITH GFR
ALT: 91 U/L — ABNORMAL HIGH (ref 0–44)
AST: 76 U/L — ABNORMAL HIGH (ref 15–41)
Albumin: 3.6 g/dL (ref 3.5–5.0)
Alkaline Phosphatase: 67 U/L (ref 38–126)
Anion gap: 14 (ref 5–15)
BUN: 15 mg/dL (ref 8–23)
CO2: 21 mmol/L — ABNORMAL LOW (ref 22–32)
Calcium: 8.3 mg/dL — ABNORMAL LOW (ref 8.9–10.3)
Chloride: 103 mmol/L (ref 98–111)
Creatinine, Ser: 0.59 mg/dL (ref 0.44–1.00)
GFR, Estimated: >60 mL/min (ref >60)
Glucose, Bld: 108 mg/dL — ABNORMAL HIGH (ref 70–99)
Potassium: 3.6 mmol/L (ref 3.5–5.1)
Sodium: 138 mmol/L (ref 135–145)
Total Bilirubin: 0.8 mg/dL (ref 0.0–1.2)
Total Protein: 6.9 g/dL (ref 6.5–8.1)

## 2023-11-11 LAB — CBC
HCT: 39.8 % (ref 36.0–46.0)
Hemoglobin: 13.8 g/dL (ref 12.0–15.0)
MCH: 33.1 pg (ref 26.0–34.0)
MCHC: 34.7 g/dL (ref 30.0–36.0)
MCV: 95.4 fL (ref 80.0–100.0)
Platelets: 202 K/uL (ref 150–400)
RBC: 4.17 MIL/uL (ref 3.87–5.11)
RDW: 13 % (ref 11.5–15.5)
WBC: 9.9 K/uL (ref 4.0–10.5)
nRBC: 0 % (ref 0.0–0.2)

## 2023-11-11 LAB — PHOSPHORUS: Phosphorus: 2.2 mg/dL — ABNORMAL LOW (ref 2.5–4.6)

## 2023-11-11 LAB — HEMOGLOBIN A1C
Hgb A1c MFr Bld: 5.6 % (ref 4.8–5.6)
Mean Plasma Glucose: 114.02 mg/dL

## 2023-11-11 LAB — MAGNESIUM: Magnesium: 1.7 mg/dL (ref 1.7–2.4)

## 2023-11-11 MED ORDER — ONDANSETRON HCL 4 MG/2ML IJ SOLN
4.0000 mg | Freq: Four times a day (QID) | INTRAMUSCULAR | Status: DC | PRN
  Administered 2023-11-11: 4 mg via INTRAVENOUS
  Filled 2023-11-11: qty 2

## 2023-11-11 MED ORDER — HYDRALAZINE HCL 20 MG/ML IJ SOLN
10.0000 mg | INTRAMUSCULAR | Status: DC | PRN
  Administered 2023-11-11: 10 mg via INTRAVENOUS
  Filled 2023-11-11: qty 1

## 2023-11-11 MED ORDER — DULOXETINE HCL 60 MG PO CPEP
60.0000 mg | ORAL_CAPSULE | Freq: Every day | ORAL | Status: DC
  Administered 2023-11-12 – 2023-11-15 (×4): 60 mg via ORAL
  Filled 2023-11-11 (×4): qty 1

## 2023-11-11 MED ORDER — FENOFIBRATE 160 MG PO TABS
160.0000 mg | ORAL_TABLET | Freq: Every day | ORAL | Status: DC
  Administered 2023-11-12 – 2023-11-15 (×4): 160 mg via ORAL
  Filled 2023-11-11 (×5): qty 1

## 2023-11-11 MED ORDER — ACETAMINOPHEN 325 MG PO TABS
650.0000 mg | ORAL_TABLET | Freq: Four times a day (QID) | ORAL | Status: DC | PRN
Start: 2023-11-11 — End: 2023-11-15
  Administered 2023-11-13 (×2): 650 mg via ORAL
  Filled 2023-11-11 (×2): qty 2

## 2023-11-11 MED ORDER — LACTATED RINGERS IV SOLN: INTRAVENOUS | Status: AC

## 2023-11-11 MED ORDER — ONDANSETRON HCL 4 MG PO TABS: 4.0000 mg | ORAL_TABLET | Freq: Four times a day (QID) | ORAL | Status: DC | PRN

## 2023-11-11 MED ORDER — POTASSIUM CHLORIDE 10 MEQ/100ML IV SOLN
10.0000 meq | INTRAVENOUS | Status: AC
  Administered 2023-11-11 (×2): 10 meq via INTRAVENOUS
  Filled 2023-11-11 (×2): qty 100

## 2023-11-11 MED ORDER — PREGABALIN 75 MG PO CAPS
200.0000 mg | ORAL_CAPSULE | Freq: Three times a day (TID) | ORAL | Status: DC
  Administered 2023-11-11 – 2023-11-15 (×12): 200 mg via ORAL
  Filled 2023-11-11 (×12): qty 1

## 2023-11-11 MED ORDER — ENOXAPARIN SODIUM 40 MG/0.4ML IJ SOSY
40.0000 mg | PREFILLED_SYRINGE | INTRAMUSCULAR | Status: DC
  Administered 2023-11-11 – 2023-11-13 (×3): 40 mg via SUBCUTANEOUS
  Filled 2023-11-11 (×3): qty 0.4

## 2023-11-11 MED ORDER — ACETAMINOPHEN 650 MG RE SUPP
650.0000 mg | Freq: Four times a day (QID) | RECTAL | Status: DC | PRN
Start: 2023-11-11 — End: 2023-11-15

## 2023-11-11 MED ORDER — LEVOTHYROXINE SODIUM 50 MCG PO TABS: 100.0000 ug | ORAL_TABLET | Freq: Every day | ORAL | Status: DC

## 2023-11-11 MED ORDER — LEVOTHYROXINE SODIUM 100 MCG PO TABS
100.0000 ug | ORAL_TABLET | Freq: Every day | ORAL | Status: DC
  Administered 2023-11-11 – 2023-11-15 (×5): 100 ug via ORAL
  Filled 2023-11-11: qty 1
  Filled 2023-11-11: qty 2
  Filled 2023-11-11 (×3): qty 1

## 2023-11-11 MED ORDER — PANTOPRAZOLE SODIUM 40 MG PO TBEC
40.0000 mg | DELAYED_RELEASE_TABLET | Freq: Every day | ORAL | Status: DC
  Administered 2023-11-12 – 2023-11-15 (×4): 40 mg via ORAL
  Filled 2023-11-11 (×5): qty 1

## 2023-11-11 MED ORDER — SODIUM CHLORIDE 0.9 % IV SOLN
1.0000 g | INTRAVENOUS | Status: DC
  Administered 2023-11-11 – 2023-11-12 (×2): 1 g via INTRAVENOUS
  Filled 2023-11-11 (×2): qty 10

## 2023-11-11 MED ORDER — AMLODIPINE BESYLATE 5 MG PO TABS
10.0000 mg | ORAL_TABLET | Freq: Every day | ORAL | Status: DC
  Administered 2023-11-11 – 2023-11-15 (×5): 10 mg via ORAL
  Filled 2023-11-11 (×5): qty 2

## 2023-11-11 NOTE — Progress Notes (Signed)
   11/11/23 0947  TOC Brief Assessment  Insurance and Status Reviewed  Patient has primary care physician Yes  Home environment has been reviewed From [facility]  Prior level of function: Need assistance  Prior/Current Home Services No current home services  Social Drivers of Health Review SDOH reviewed no interventions necessary  Readmission risk has been reviewed Yes  Transition of care needs no transition of care needs at this time   Transition of Care Department [facility]) has reviewed patient and no TOC needs have been identified at this time. We will continue to monitor patient advancement through interdisciplinary progression rounds. If new patient transition needs arise, please place a TOC consult.

## 2023-11-11 NOTE — Progress Notes (Signed)
 PROGRESS NOTE    [name]  FMW:[id number] DOB: [birth year] DOA: 11/10/2023 PCP: Shona Norleen PEDLAR, MD   Brief Narrative:    [name] is a 83 y.o. female with medical history significant of hypertension, hyperlipidemia, hypothyroidism, GERD who presents to the emergency department accompanied by daughter and son due to 2-day onset of vomiting, diarrhea, left lower abdominal pain.  Patient was admitted with intractable nausea and vomiting along with diarrhea likely related to viral gastroenteritis and also appears to have UTI and was started on IV Rocephin .  Assessment & Plan:   Principal Problem:   Intractable nausea and vomiting Active Problems:   Acquired hypothyroidism   GERD   Dehydration   UTI (urinary tract infection)   Hyperglycemia   Obesity, Class I, BMI 30-34.9   Essential hypertension   Mixed hyperlipidemia  Assessment and Plan:  Intractable nausea and vomiting with diarrhea suspect viral gastroenteritis Dehydration Continue IV Zofran  p.r.n. Continue IV hydration Patient will be started on clear liquid diet in the morning with plan to advance as tolerated Check GI panel with ongoing diarrhea   UTI POA Continue IV ceftriaxone  Urine cultures ordered and pending   Hyperglycemia possibly reactive CBG 144.  Patient without any history of T2DM Hemoglobin A1c will be checked   Obesity class I (BMI 30.04) Diet and lifestyle modification   Essential hypertension Continue amlodipine    Mixed hyperlipidemia Continue Tricor    Acquired hypothyroidism Continue Synthroid    GERD Continue PPI   DVT prophylaxis: Lovenox  Code Status: Full Family Communication: None at bedside Disposition Plan:  Status is: Inpatient Remains inpatient appropriate because: Need for IV fluids and medications.   Consultants:  None  Procedures:  None  Antimicrobials:  Anti-infectives (From admission, onward)    Start     Dose/Rate Route Frequency Ordered Stop    11/11/23 1000  cefTRIAXone  (ROCEPHIN ) 1 g in sodium chloride  0.9 % 100 mL IVPB        1 g 200 mL/hr over 30 Minutes Intravenous Every 24 hours 11/11/23 0034     11/10/23 2130  cefTRIAXone  (ROCEPHIN ) 2 g in sodium chloride  0.9 % 100 mL IVPB        2 g 200 mL/hr over 30 Minutes Intravenous  Once 11/10/23 2121 11/10/23 2221       Subjective: Patient seen and evaluated today with some ongoing nausea, but denies any further vomiting episodes.  Has some mild crampy abdominal pain and had 1 episode of loose stools this morning.  Objective: Vitals:   11/11/23 0705 11/11/23 0730 11/11/23 0815 11/11/23 0945  BP: (!) 200/86 (!) 174/81 (!) 170/72 (!) 175/79  Pulse:  65 67 64  Resp:  18 13 (!) 22  Temp:      TempSrc:      SpO2:  94% 92% 97%  Weight:      Height:        Intake/Output Summary (Last 24 hours) at 11/11/2023 1109 Last data filed at 11/11/2023 0258 Gross per 24 hour  Intake 1188.15 ml  Output --  Net 1188.15 ml   Filed Weights   11/10/23 2049  Weight: 74.5 kg    Examination:  General exam: Appears calm and comfortable  Respiratory system: Clear to auscultation. Respiratory effort normal. Cardiovascular system: S1 & S2 heard, RRR.  Gastrointestinal system: Abdomen is soft Central nervous system: Alert and awake Extremities: No edema Skin: No significant lesions noted Psychiatry: Flat affect.    Data Reviewed: I have personally reviewed following labs and  imaging studies  CBC: Recent Labs  Lab 11/10/23 2024 11/11/23 0246  WBC 11.3* 9.9  HGB 15.5* 13.8  HCT 45.0 39.8  MCV 95.1 95.4  PLT 257 202   Basic Metabolic Panel: Recent Labs  Lab 11/10/23 2024 11/11/23 0246  NA 138 138  K 3.4* 3.6  CL 102 103  CO2 19* 21*  GLUCOSE 144* 108*  BUN 19 15  CREATININE 0.76 0.59  CALCIUM 9.3 8.3*  MG  --  1.7  PHOS  --  2.2*   GFR: Estimated Creatinine Clearance: 51.3 mL/min (by C-G formula based on SCr of 0.59 mg/dL). Liver Function Tests: Recent Labs  Lab  11/10/23 2024 11/11/23 0246  AST 127* 76*  ALT 123* 91*  ALKPHOS 85 67  BILITOT 1.2 0.8  PROT 8.3* 6.9  ALBUMIN  4.4 3.6   No results for input(s): LIPASE, AMYLASE in the last 168 hours. No results for input(s): AMMONIA in the last 168 hours. Coagulation Profile: No results for input(s): INR, PROTIME in the last 168 hours. Cardiac Enzymes: No results for input(s): CKTOTAL, CKMB, CKMBINDEX, TROPONINI in the last 168 hours. BNP (last 3 results) No results for input(s): PROBNP in the last 8760 hours. HbA1C: Recent Labs    11/11/23 0246  HGBA1C 5.6   CBG: Recent Labs  Lab 11/10/23 2254  GLUCAP 110*   Lipid Profile: No results for input(s): CHOL, HDL, LDLCALC, TRIG, CHOLHDL, LDLDIRECT in the last 72 hours. Thyroid  Function Tests: No results for input(s): TSH, T4TOTAL, FREET4, T3FREE, THYROIDAB in the last 72 hours. Anemia Panel: No results for input(s): VITAMINB12, FOLATE, FERRITIN, TIBC, IRON, RETICCTPCT in the last 72 hours. Sepsis Labs: No results for input(s): PROCALCITON, LATICACIDVEN in the last 168 hours.  No results found for this or any previous visit (from the past 240 hours).       Radiology Studies: No results found.      Scheduled Meds:  amLODipine   10 mg Oral Daily   enoxaparin  (LOVENOX ) injection  40 mg Subcutaneous Q24H   fenofibrate   160 mg Oral Daily   levothyroxine   100 mcg Oral Q0600   pantoprazole   40 mg Oral Daily   Continuous Infusions:  cefTRIAXone  (ROCEPHIN )  IV     lactated ringers  80 mL/hr at 11/11/23 0051     LOS: 1 day    Time spent: 55 minutes    Annelisa Ryback JONETTA Fairly, DO [facility]  If 7PM-7AM, please contact night-coverage [email or web address] 11/11/2023, 11:09 AM

## 2023-11-11 NOTE — ED Notes (Signed)
Pt ambulated to restroom w/ 1 assist. 

## 2023-11-11 NOTE — ED Notes (Signed)
 Pt ambulatory to the bathroom w/ no complaints, after voiding pt became diaphoretic and had 4 episodes of emesis in the bathroom, pt able to ambulate back to room, then had one more episode of emesis in room

## 2023-11-11 NOTE — ED Notes (Signed)
 Pt has started to vomit again. Zofran  given per [facility].

## 2023-11-12 DIAGNOSIS — K529 Noninfective gastroenteritis and colitis, unspecified: Secondary | ICD-10-CM | POA: Diagnosis not present

## 2023-11-12 DIAGNOSIS — R112 Nausea with vomiting, unspecified: Secondary | ICD-10-CM | POA: Diagnosis not present

## 2023-11-12 DIAGNOSIS — E876 Hypokalemia: Secondary | ICD-10-CM | POA: Diagnosis not present

## 2023-11-12 LAB — COMPREHENSIVE METABOLIC PANEL WITH GFR
ALT: 66 U/L — ABNORMAL HIGH (ref 0–44)
AST: 43 U/L — ABNORMAL HIGH (ref 15–41)
Albumin: 3.5 g/dL (ref 3.5–5.0)
Alkaline Phosphatase: 65 U/L (ref 38–126)
Anion gap: 11 (ref 5–15)
BUN: 11 mg/dL (ref 8–23)
CO2: 24 mmol/L (ref 22–32)
Calcium: 8.7 mg/dL — ABNORMAL LOW (ref 8.9–10.3)
Chloride: 103 mmol/L (ref 98–111)
Creatinine, Ser: 0.6 mg/dL (ref 0.44–1.00)
GFR, Estimated: >60 mL/min (ref >60)
Glucose, Bld: 102 mg/dL — ABNORMAL HIGH (ref 70–99)
Potassium: 2.7 mmol/L — CL (ref 3.5–5.1)
Sodium: 138 mmol/L (ref 135–145)
Total Bilirubin: 0.8 mg/dL (ref 0.0–1.2)
Total Protein: 6.7 g/dL (ref 6.5–8.1)

## 2023-11-12 LAB — PHOSPHORUS: Phosphorus: 2.7 mg/dL (ref 2.5–4.6)

## 2023-11-12 LAB — URINE CULTURE: Culture: NO GROWTH

## 2023-11-12 LAB — MAGNESIUM: Magnesium: 1.8 mg/dL (ref 1.7–2.4)

## 2023-11-12 MED ORDER — POTASSIUM CHLORIDE CRYS ER 20 MEQ PO TBCR
40.0000 meq | EXTENDED_RELEASE_TABLET | Freq: Once | ORAL | Status: AC
  Administered 2023-11-12: 40 meq via ORAL
  Filled 2023-11-12: qty 2

## 2023-11-12 MED ORDER — POTASSIUM CHLORIDE 10 MEQ/100ML IV SOLN
10.0000 meq | INTRAVENOUS | Status: AC
  Administered 2023-11-12 (×2): 10 meq via INTRAVENOUS
  Filled 2023-11-12 (×2): qty 100

## 2023-11-12 NOTE — Plan of Care (Signed)
   Problem: Activity: Goal: Risk for activity intolerance will decrease Outcome: Progressing   Problem: Coping: Goal: Level of anxiety will decrease Outcome: Progressing

## 2023-11-12 NOTE — TOC CM/SW Note (Signed)
 PT has recommended SNF, family is only interested in [facility]. Admissions coordinator at [facility] has reviewed chart and can admit pt Monday. Hospitalist notified, staff updated.   PASRR obtained, FL2 completed, auth started.

## 2023-11-12 NOTE — TOC Initial Note (Signed)
 Transition of Care [facility]) - Initial/Assessment Note    Patient Details  Name: [name] MRN: [id number] Date of Birth: 1940/05/03  [facility] CM/SW Contact:    Noreen KATHEE Pinal, LCSWA Phone Number: 11/12/2023, 10:44 AM  Clinical Narrative:                  CSW called [facility] to see if they would be able to accept patient over the weekend once she is DC. They confirmed being able to once patient was medically stable. Afterwards, CSW called daughter and provided her with an update as well as PT pending eval to see what recommendations will be. Daughter I fine with SNF if recommended and desires PNC. TOC following.    Barriers to Discharge: Continued Medical Work up   Patient Goals and CMS Choice Patient states their goals for this hospitalization and ongoing recovery are:: hope to return back [facility].gov Compare Post Acute Care list provided to:: Patient Represenative (must comment) (Jenna - daughter) Choice offered to / list presented to : Adult Children [place] ownership interest in [facility]provided to:: Adult Children    Expected Discharge Plan and Services     Post Acute Care Choice: Durable Medical Equipment Living arrangements for the past 2 months:  (Independent living)                                      Prior Living Arrangements/Services Living arrangements for the past 2 months:  (Independent living) Lives with:: Facility Resident Patient language and need for interpreter reviewed:: Yes Do you feel safe going back to the place where you live?: Yes      Need for Family Participation in Patient Care: No (Comment) Care giver support system in place?: No (comment)   Criminal Activity/Legal Involvement Pertinent to Current Situation/Hospitalization: No - Comment as needed  Activities of Daily Living   ADL Screening (condition at time of admission) Independently performs ADLs?: Yes (appropriate for  developmental age) Is the patient deaf or have difficulty hearing?: No Does the patient have difficulty seeing, even when wearing glasses/contacts?: No Does the patient have difficulty concentrating, remembering, or making decisions?: No  Permission Sought/Granted      Share Information with NAME: Jenna     Permission granted to share info w Relationship: Daughter     Emotional Assessment Appearance:: Appears stated age Attitude/Demeanor/Rapport: Self-Confident Affect (typically observed): Stable Orientation: : Oriented to Self, Oriented to Place, Oriented to  Time, Oriented to Situation Alcohol / Substance Use: Not Applicable Psych Involvement: No (comment)  Admission diagnosis:  Lower urinary tract infectious disease [N39.0] Confusion [R41.0] Intractable nausea and vomiting [R11.2] Patient Active Problem List   Diagnosis Date Noted   Dehydration 11/11/2023   UTI (urinary tract infection) 11/11/2023   Hyperglycemia 11/11/2023   Obesity, Class I, BMI 30-34.9 11/11/2023   Essential hypertension 11/11/2023   Mixed hyperlipidemia 11/11/2023   Intractable nausea and vomiting 11/10/2023   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   Lumbar foraminal stenosis 03/24/2018   S/P lumbar fusion 05/20/2017   Altered mental status 09/24/2015   Colon cancer screening    Encounter for screening colonoscopy 04/04/2014   Lumbago 05/25/2012   Abdominal pain 07/31/2010   Diarrhea 07/31/2010   GERD 11/19/2008   FATTY LIVER DISEASE 11/19/2008   Acquired hypothyroidism 11/15/2008   OBESITY 11/15/2008   Fibromyalgia 11/15/2008  Chronic fatigue 11/15/2008   CHOLELITHIASIS, HX OF 11/15/2008   PCP:  Shona Norleen PEDLAR, MD Pharmacy:   [facility] [place], [place] - [address] [place] [address] Phone: [phone] Fax: [phone]     Social Drivers of Health (SDOH) Social History: SDOH Screenings   Food Insecurity: No Food Insecurity (11/11/2023)  Housing:  Low Risk  (11/11/2023)  Transportation Needs: No Transportation Needs (11/11/2023)  Utilities: Not At Risk (11/11/2023)  Social Connections: Moderately Isolated (11/11/2023)  Tobacco Use: Low Risk  (11/10/2023)   SDOH Interventions:     Readmission Risk Interventions    11/12/2023   10:26 AM  Readmission Risk Prevention Plan  Medication Screening Complete  Transportation Screening Complete

## 2023-11-12 NOTE — Progress Notes (Signed)
 PROGRESS NOTE   [name]  FMW:[id number]    DOB: 1940/05/27    DOA: 11/10/2023  PCP: Shona Norleen PEDLAR, MD   I have briefly reviewed patients previous medical records in [facility].   Brief Hospital Course:  83 y.o. female, resident of her retirement home, ambulates with the help of a walker with medical history significant of hypertension, hyperlipidemia, hypothyroidism, GERD, left foot drop who presents to the emergency department with complaints of  2-day onset of vomiting, diarrhea, left lower abdominal pain.  Patient was admitted with intractable nausea and vomiting along with diarrhea likely related to viral gastroenteritis.  She denies eating anything unusual and cannot tell if any other people in the retirement community had similar symptoms.  Clinically improved, advancing diet and possible DC home 8/23.   Assessment & Plan:   Acute viral gastroenteritis Presented with associated intractable nausea, vomiting and diarrhea Self-limited and has improved.  No further symptoms since yesterday, tolerating liquid diet and wants to advance to soft diet  Hypokalemia Secondary to earlier GI losses and poor oral intake Replace aggressively and follow.  Magnesium 1.8  Mild transaminitis ?  Related to acute GE.  Spontaneously improving, monitor CMP in a.m.  Ruled out UTI Had no UTI symptoms, did get empiric IV ceftriaxone  x 3 doses. Urine culture shows no growth Discontinued ceftriaxone   Ruled out DM or pre-DM A1c 5.6.  Essential hypertension Continue amlodipine , controlled   Mixed hyperlipidemia Continue Tricor    Acquired hypothyroidism Continue Synthroid    GERD Continue PPI   Body mass index is 29.27 kg/m./Overweight   DVT prophylaxis: enoxaparin  (LOVENOX ) injection 40 mg Start: 11/11/23 0600 SCDs Start: 11/11/23 0032     Code Status: Limited: Do not attempt resuscitation (DNR) -DNR-LIMITED -Do Not Intubate/DNI :  Family Communication: None at  bedside Disposition:  Status is: Inpatient Remains inpatient appropriate because: Significant hypokalemia, replacing p.o./IV, advancing diet and care has already crossed 2 midnights.     Consultants:   None  Procedures:     Subjective:  Doing much better.  No nausea, vomiting, abdominal pain.  Tolerating liquid diet.  Asking to advance to soft food.  No BM since ED.  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  Objective:   Vitals:   11/11/23 1413 11/11/23 1830 11/11/23 2201 11/12/23 0215  BP: (!) 170/76 (!) 154/72 139/74 136/68  Pulse: 73  81 71  Resp: 18 16 17 18   Temp: 98.6 F (37 C) 98.9 F (37.2 C) 98.2 F (36.8 C) 98.4 F (36.9 C)  TempSrc: Oral Oral Oral Oral  SpO2: 99%  97% 95%  Weight: 72.6 kg     Height: 5' 2 (1.575 m)       General exam: Elderly female, moderately built and nourished lying comfortably propped up in bed without distress.  Oral mucosa moist Respiratory system: Clear to auscultation. Respiratory effort normal. Cardiovascular system: S1 & S2 heard, RRR. No JVD, murmurs, rubs, gallops or clicks. No pedal edema.  Not on telemetry. Gastrointestinal system: Abdomen is nondistended, soft and nontender. No organomegaly or masses felt. Normal bowel sounds heard. Central nervous system: Alert and oriented. No focal neurological deficits. Extremities: Symmetric 5 x 5 power.  Left foot drop noted, chronic. Skin: No rashes, lesions or ulcers Psychiatry: Judgement and insight appear normal. Mood & affect appropriate.     Data Reviewed:   I have personally reviewed following labs and imaging studies   CBC: Recent Labs  Lab 11/10/23 2024 11/11/23 0246  WBC 11.3* 9.9  HGB 15.5* 13.8  HCT 45.0 39.8  MCV 95.1 95.4  PLT 257 202    Basic Metabolic Panel: Recent Labs  Lab 11/10/23 2024 11/11/23 0246 11/12/23 0418  NA 138 138 138  K 3.4* 3.6 2.7*  CL 102 103 103  CO2 19* 21* 24  GLUCOSE 144* 108* 102*  BUN 19  15 11   CREATININE 0.76 0.59 0.60  CALCIUM 9.3 8.3* 8.7*  MG  --  1.7 1.8  PHOS  --  2.2* 2.7    Liver Function Tests: Recent Labs  Lab 11/10/23 2024 11/11/23 0246 11/12/23 0418  AST 127* 76* 43*  ALT 123* 91* 66*  ALKPHOS 85 67 65  BILITOT 1.2 0.8 0.8  PROT 8.3* 6.9 6.7  ALBUMIN  4.4 3.6 3.5    CBG: Recent Labs  Lab 11/10/23 2254  GLUCAP 110*    Microbiology Studies:   Recent Results (from the past 240 hours)  Urine Culture     Status: None   Collection Time: 11/10/23  9:32 PM   Specimen: Urine, Catheterized  Result Value Ref Range Status   Specimen Description   Final    URINE, CATHETERIZED Performed at [facility], [address], [place], [place] [address]    Special Requests   Final    NONE Performed at [facility], [address] [place], [place] [address]    Culture   Final    NO GROWTH Performed at [facility], [address] [place], [place] [address]    Report Status 11/12/2023 FINAL  Final    Radiology Studies:  No results found.  Scheduled Meds:    amLODipine   10 mg Oral Daily   DULoxetine   60 mg Oral QAC breakfast   enoxaparin  (LOVENOX ) injection  40 mg Subcutaneous Q24H   fenofibrate   160 mg Oral Daily   levothyroxine   100 mcg Oral Q0600   pantoprazole   40 mg Oral Daily   potassium chloride   40 mEq Oral Once   pregabalin   200 mg Oral TID    Continuous Infusions:    cefTRIAXone  (ROCEPHIN )  IV 1 g (11/12/23 1057)   lactated ringers  75 mL/hr at 11/12/23 0553     LOS: 2 days     Trenda Mar, MD,  FACP, [name], [facility], [facility]   [facility] [place]      To contact the attending provider between 7A-7P or the covering provider during after hours 7P-7A, please log into the web site [email or web address] and access using universal [place] password for that web site. If you do not have the password, please call the hospital operator.  11/12/2023, 11:10 AM

## 2023-11-12 NOTE — Plan of Care (Signed)
  Problem: Acute Rehab PT Goals(only PT should resolve) Goal: Pt Will Go Supine/Side To Sit Outcome: Progressing Flowsheets (Taken 11/12/2023 1522) Pt will go Supine/Side to Sit: with modified independence Goal: Patient Will Transfer Sit To/From Stand Outcome: Progressing Flowsheets (Taken 11/12/2023 1522) Patient will transfer sit to/from stand: with modified independence Goal: Pt Will Transfer Bed To Chair/Chair To Bed Outcome: Progressing Flowsheets (Taken 11/12/2023 1522) Pt will Transfer Bed to Chair/Chair to Bed: with modified independence Goal: Pt Will Ambulate Outcome: Progressing Flowsheets (Taken 11/12/2023 1522) Pt will Ambulate:  50 feet  with modified independence  with rolling walker    3:23 PM, 11/12/23 Rosaria Settler, PT, DPT [place] with [facility]

## 2023-11-12 NOTE — Evaluation (Signed)
 Physical Therapy Evaluation Patient Details Name: [name] MRN: [id number] DOB: 1941/02/21 Today's Date: 11/12/2023  History of Present Illness  [name] is a 83 y.o. female with medical history significant of hypertension, hyperlipidemia, hypothyroidism, GERD who presents to the emergency department accompanied by daughter and son due to 2-day onset of vomiting, diarrhea, left lower abdominal pain.  Most of the history was obtained from daughter at bedside.  By history, patient lives at [facility] and she was reported to develop altered mental status whereby she was walking nakedly in the hallway at the facility (unusual for her).  911 was activated and patient was taken to [facility] ED where she was diagnosed to have UTI.  Oral antibiotics and antiemetics were prescribed.  Unfortunately, on returning to the facility, patient continued to vomit and was unable to hold the antibiotics, so she was brought to this ED.  Diarrhea was watery in nature and last episode was this afternoon.  Vomiting was NBNB.   Clinical Impression  Patient agreeable to PT evaluation. Patient was received supine in bed. Required min A for bed mobility, and STS. During bed>chair transfer and ambulation with RW, pt required min/CGA due to general fatigue, weakness, and mild unsteadiness. Patient tolerated sitting in recliner at end of session, call button within reach, and nursing notified. Patient reports at baseline she is mod independent with all mobility with use of rollator at her independent living facility. Patient reports she feels weaker than usual and still has some stomach pains, but cognitively feels much better. Patient reports she would be interested in short Rehab stay prior to returning to independent living to work on current deficits due to living alone and staff only assisting with iADLs. Patient will benefit from continued skilled physical therapy acutely and in recommended  venue in order to address current deficits with possibility of progressing recommendation with pt improvement.        If plan is discharge home, recommend the following: A little help with walking and/or transfers;A little help with bathing/dressing/bathroom;Assist for transportation;Help with stairs or ramp for entrance   Can travel by private vehicle        Equipment Recommendations None recommended by PT  Recommendations for Other Services       Functional Status Assessment Patient has had a recent decline in their functional status and demonstrates the ability to make significant improvements in function in a reasonable and predictable amount of time.     Precautions / Restrictions Precautions Precautions: Fall Recall of Precautions/Restrictions: Intact Restrictions Weight Bearing Restrictions Per Provider Order: No      Mobility  Bed Mobility Overal bed mobility: Needs Assistance Bed Mobility: Supine to Sit     Supine to sit: Min assist     General bed mobility comments: HOB flat, use of railings, mild pull to sit due to gen weak fatigue    Transfers Overall transfer level: Needs assistance Equipment used: Rolling walker (2 wheels) Transfers: Sit to/from Stand, Bed to chair/wheelchair/BSC Sit to Stand: Min assist   Step pivot transfers: Min assist, Contact guard assist       General transfer comment: pt demo labored movement, STS from bed and recliner with RW, min assist due to general weakness and mild unsteadiness    Ambulation/Gait Ambulation/Gait assistance: Contact guard assist Gait Distance (Feet): 30 Feet Assistive device: Rolling walker (2 wheels) Gait Pattern/deviations: Step-through pattern, Decreased step length - right, Decreased step length - left, Decreased stride length Gait velocity:  Dec     General Gait Details: Pt ambulated in room with RW and CGA due to reported weakness, and mild unsteadiness. Reports not feeling as her baseline as  pt demo dec gait speed and labored movement  Stairs            Wheelchair Mobility     Tilt Bed    Modified Rankin (Stroke Patients Only)       Balance Overall balance assessment: Needs assistance Sitting-balance support: Feet supported, No upper extremity supported Sitting balance-Leahy Scale: Good Sitting balance - Comments: Seated EOB   Standing balance support: During functional activity, Bilateral upper extremity supported Standing balance-Leahy Scale: Fair Standing balance comment: Fair w/ RW         Pertinent Vitals/Pain Pain Assessment Pain Assessment: 0-10 Pain Score: 3  Pain Location: Stomach Pain Descriptors / Indicators: Constant, Other (Comment) (nausea) Pain Intervention(s): Limited activity within patient's tolerance, Repositioned, Monitored during session    Home Living Family/patient expects to be discharged to:: Other (Comment)     Additional Comments: Per chart review and pt confirms, she is a resident of an [facility].    Prior Function Prior Level of Function : Independent/Modified Independent     Mobility Comments: Pt reports as household ambulator with rollator, doesnt do much ambulation outside of home. Reports bathroom is not big enough for rollator but set up is close enough that she can use sink for stability. ADLs Comments: Reports independent with ADLs, reports staff performs iADLs, although she could perform light cleaning in her space.     Extremity/Trunk Assessment   Upper Extremity Assessment Upper Extremity Assessment: Overall WFL for tasks assessed;Generalized weakness (Shoulder flexion ROM WFL, MMT 4-/5)    Lower Extremity Assessment Lower Extremity Assessment: Generalized weakness;LLE deficits/detail (RLE gen weak but WFL) LLE Deficits / Details: L foot drop, no active DF noted this date, pt reports as chronic due to previous back surgery, L knee ext ROM WFL and MMT 4-/5, hip flexion MMT 4-/5 LLE  Coordination: decreased gross motor    Cervical / Trunk Assessment Cervical / Trunk Assessment: Normal  Communication   Communication Communication: No apparent difficulties    Cognition Arousal: Alert Behavior During Therapy: WFL for tasks assessed/performed       PT - Cognition Comments: Pt reports feeling more alert today and like herself Following commands: Intact       Cueing Cueing Techniques: Verbal cues, Visual cues, Tactile cues     General Comments      Exercises     Assessment/Plan    PT Assessment Patient needs continued PT services;All further PT needs can be met in the next venue of care  PT Problem List Decreased strength;Decreased activity tolerance;Decreased balance;Decreased mobility       PT Treatment Interventions DME instruction;Balance training;Gait training;Functional mobility training;Therapeutic activities;Therapeutic exercise;Patient/family education    PT Goals (Current goals can be found in the Care Plan section)  Acute Rehab PT Goals Patient Stated Goal: Return to ALF following short rehab stay PT Goal Formulation: With patient Time For Goal Achievement: 11/19/23 Potential to Achieve Goals: Good    Frequency Min 3X/week     Co-evaluation               AM-PAC PT 6 Clicks Mobility  Outcome Measure Help needed turning from your back to your side while in a flat bed without using bedrails?: None Help needed moving from lying on your back to sitting on the side of a flat  bed without using bedrails?: A Little Help needed moving to and from a bed to a chair (including a wheelchair)?: A Little Help needed standing up from a chair using your arms (e.g., wheelchair or bedside chair)?: A Little Help needed to walk in hospital room?: A Little Help needed climbing 3-5 steps with a railing? : A Lot 6 Click Score: 18    End of Session Equipment Utilized During Treatment: Gait belt Activity Tolerance: Patient tolerated treatment  well;Patient limited by fatigue Patient left: in chair;with call bell/phone within reach Nurse Communication: Mobility status PT Visit Diagnosis: Unsteadiness on feet (R26.81);Other abnormalities of gait and mobility (R26.89);Muscle weakness (generalized) (M62.81)    Time: 8897-8864 PT Time Calculation (min) (ACUTE ONLY): 33 min   Charges:   PT Evaluation $PT Eval Moderate Complexity: 1 Mod   PT General Charges $$ ACUTE PT VISIT: 1 Visit        3:21 PM, 11/12/23 Zacheriah Stumpe Powell-Butler, PT, DPT [place] with [facility]

## 2023-11-12 NOTE — [place] FL2 (Signed)
 [place]  MEDICAID FL2 LEVEL OF CARE FORM     IDENTIFICATION  Patient Name: [name] Birthdate: [birth year] Sex: female Admission Date (Current Location): 11/10/2023  [facility] [place] [facility]:  [employer] and Address:  [facility],  [address] [place] [address]      Provider Number: [id number]  Attending Physician Name and Address:  Judeth Trenda BIRCH, MD  Relative Name and Phone Number:  Andriette Quiet ( daughter) [phone]    Current Level of Care: Hospital Recommended Level of Care: Skilled Nursing Facility Prior Approval Number:    Date Approved/Denied: 11/12/23 PASRR Number: [id number]  Discharge Plan: SNF    Current Diagnoses: Patient Active Problem List   Diagnosis Date Noted   Dehydration 11/11/2023   UTI (urinary tract infection) 11/11/2023   Hyperglycemia 11/11/2023   Obesity, Class I, BMI 30-34.9 11/11/2023   Essential hypertension 11/11/2023   Mixed hyperlipidemia 11/11/2023   Intractable nausea and vomiting 11/10/2023   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   Lumbar foraminal stenosis 03/24/2018   S/P lumbar fusion 05/20/2017   Altered mental status 09/24/2015   Colon cancer screening    Encounter for screening colonoscopy 04/04/2014   Lumbago 05/25/2012   Abdominal pain 07/31/2010   Diarrhea 07/31/2010   GERD 11/19/2008   FATTY LIVER DISEASE 11/19/2008   Acquired hypothyroidism 11/15/2008   OBESITY 11/15/2008   Fibromyalgia 11/15/2008   Chronic fatigue 11/15/2008   CHOLELITHIASIS, HX OF 11/15/2008    Orientation RESPIRATION BLADDER Height & Weight     Self, Time, Situation, Place  Normal Continent Weight: 72.6 kg Height:  5' 2 (157.5 cm)  BEHAVIORAL SYMPTOMS/MOOD NEUROLOGICAL BOWEL NUTRITION STATUS      Continent Diet (Soft)  AMBULATORY STATUS COMMUNICATION OF NEEDS Skin   Limited Assist Verbally Normal                       Personal Care Assistance Level of Assistance  Bathing,  Feeding, Dressing Bathing Assistance: Limited assistance Feeding assistance: Independent Dressing Assistance: Limited assistance     Functional Limitations Info  Sight, Hearing, Speech Sight Info: Impaired Hearing Info: Adequate Speech Info: Adequate    SPECIAL CARE FACTORS FREQUENCY  PT (By licensed PT), OT (By licensed OT)     PT Frequency: 5 x a week OT Frequency: 5 x a week            Contractures Contractures Info: Not present    Additional Factors Info  Allergies Code Status Info: DNR-Limited Allergies Info: Sulfonamide Derivatives, Adhesive Tape           Current Medications (11/12/2023):  This is the current hospital active medication list Current Facility-Administered Medications  Medication Dose Route Frequency Provider Last Rate Last Admin   acetaminophen  (TYLENOL ) tablet 650 mg  650 mg Oral Q6H PRN Adefeso, Oladapo, DO       Or   acetaminophen  (TYLENOL ) suppository 650 mg  650 mg Rectal Q6H PRN Adefeso, Oladapo, DO       amLODipine  (NORVASC ) tablet 10 mg  10 mg Oral Daily Adefeso, Oladapo, DO   10 mg at 11/12/23 0810   DULoxetine  (CYMBALTA ) DR capsule 60 mg  60 mg Oral QAC breakfast Shah, Pratik D, DO   60 mg at 11/12/23 0810   enoxaparin  (LOVENOX ) injection 40 mg  40 mg Subcutaneous Q24H Adefeso, Oladapo, DO   40 mg at 11/12/23 0555   fenofibrate  tablet 160 mg  160 mg Oral Daily Adefeso, Oladapo,  DO   160 mg at 11/12/23 0810   hydrALAZINE  (APRESOLINE ) injection 10 mg  10 mg Intravenous Q4H PRN Shah, Pratik D, DO   10 mg at 11/11/23 1126   levothyroxine  (SYNTHROID ) tablet 100 mcg  100 mcg Oral Q0600 Adefeso, Oladapo, DO   100 mcg at 11/12/23 0553   ondansetron  (ZOFRAN ) tablet 4 mg  4 mg Oral Q6H PRN Adefeso, Oladapo, DO       Or   ondansetron  (ZOFRAN ) injection 4 mg  4 mg Intravenous Q6H PRN Adefeso, Oladapo, DO   4 mg at 11/11/23 1124   pantoprazole  (PROTONIX ) EC tablet 40 mg  40 mg Oral Daily Adefeso, Oladapo, DO   40 mg at 11/12/23 9189   pregabalin   (LYRICA ) capsule 200 mg  200 mg Oral TID Shah, Pratik D, DO   200 mg at 11/12/23 9189     Discharge Medications: Please see discharge summary for a list of discharge medications.  Relevant Imaging Results:  Relevant Lab Results:   Additional Information SSN: [id number]  Nena LITTIE Coffee, RN

## 2023-11-13 DIAGNOSIS — K529 Noninfective gastroenteritis and colitis, unspecified: Secondary | ICD-10-CM | POA: Diagnosis not present

## 2023-11-13 LAB — COMPREHENSIVE METABOLIC PANEL WITH GFR
ALT: 59 U/L — ABNORMAL HIGH (ref 0–44)
AST: 38 U/L (ref 15–41)
Albumin: 3.5 g/dL (ref 3.5–5.0)
Alkaline Phosphatase: 65 U/L (ref 38–126)
Anion gap: 9 (ref 5–15)
BUN: 13 mg/dL (ref 8–23)
CO2: 23 mmol/L (ref 22–32)
Calcium: 8.8 mg/dL — ABNORMAL LOW (ref 8.9–10.3)
Chloride: 105 mmol/L (ref 98–111)
Creatinine, Ser: 0.71 mg/dL (ref 0.44–1.00)
GFR, Estimated: >60 mL/min (ref >60)
Glucose, Bld: 103 mg/dL — ABNORMAL HIGH (ref 70–99)
Potassium: 3.6 mmol/L (ref 3.5–5.1)
Sodium: 137 mmol/L (ref 135–145)
Total Bilirubin: 0.7 mg/dL (ref 0.0–1.2)
Total Protein: 6.7 g/dL (ref 6.5–8.1)

## 2023-11-13 NOTE — Plan of Care (Signed)

## 2023-11-13 NOTE — Progress Notes (Addendum)
 PROGRESS NOTE   [name]  FMW:[id number]    DOB: [birth year]    DOA: 11/10/2023  PCP: Shona Norleen PEDLAR, MD   I have briefly reviewed patients previous medical records in [facility].   Brief Hospital Course:  83 y.o. female, resident of her retirement home, ambulates with the help of a walker with medical history significant of hypertension, hyperlipidemia, hypothyroidism, GERD, left foot drop who presents to the emergency department with complaints of  2-day onset of vomiting, diarrhea, left lower abdominal pain.  Patient was admitted with intractable nausea and vomiting along with diarrhea likely related to viral gastroenteritis.  She denies eating anything unusual and cannot tell if any other people in the retirement community had similar symptoms.  Patient is medically stable for DC to SNF as of 8/23 but SNF will not be able to accept her until Monday 8/25.   Assessment & Plan:   Acute viral gastroenteritis Presented with intractable nausea, vomiting and diarrhea Self-limited and resolved.    Hypokalemia Secondary to earlier GI losses and poor oral intake Replaced.  Magnesium 1.8  Mild transaminitis ?  Related to acute GE.  Improved and almost back to normal.  Ruled out UTI Had no UTI symptoms, did get empiric IV ceftriaxone  x 3 doses. Urine culture shows no growth Discontinued ceftriaxone   Ruled out DM or pre-DM A1c 5.6.  Essential hypertension Continue amlodipine , controlled   Mixed hyperlipidemia Continue Tricor    Acquired hypothyroidism Continue Synthroid    GERD Continue PPI   Body mass index is 29.27 kg/m./Overweight   DVT prophylaxis: enoxaparin  (LOVENOX ) injection 40 mg Start: 11/11/23 0600 SCDs Start: 11/11/23 0032     Code Status: Limited: Do not attempt resuscitation (DNR) -DNR-LIMITED -Do Not Intubate/DNI :  Family Communication: Daughter via phone. Disposition:  Patient is medically stable for DC to SNF.     Consultants:    None  Procedures:     Subjective:  Denies complaints.  Very happy to get regular food and tolerating same without nausea, vomiting or abdominal pain.  No BM in the last 24 hours.  States that she was up in the chair for 1/2 of the day yesterday and plans to be up in the chair again today.  Objective:   Vitals:   11/12/23 0215 11/12/23 1319 11/12/23 2110 11/13/23 0450  BP: 136/68 138/70 135/65 135/69  Pulse: 71 71 72 73  Resp: 18 19 17 19   Temp: 98.4 F (36.9 C) 98 F (36.7 C) 98.7 F (37.1 C) 97.8 F (36.6 C)  TempSrc: Oral Oral Oral Oral  SpO2: 95% 96% 97% 96%  Weight:      Height:        General exam: Elderly female, moderately built and nourished lying comfortably propped up in bed without distress.  Oral mucosa moist.  Eating breakfast. Respiratory system: Clear to auscultation. Respiratory effort normal. Cardiovascular system: S1 & S2 heard, RRR. No JVD, murmurs, rubs, gallops or clicks.  No pedal edema.  Not on telemetry. Gastrointestinal system: Abdomen is nondistended, soft and nontender. No organomegaly or masses felt. Normal bowel sounds heard. Central nervous system: Alert and oriented. No focal neurological deficits. Extremities: Symmetric 5 x 5 power.  Left foot drop noted, chronic. Skin: No rashes, lesions or ulcers Psychiatry: Judgement and insight appear normal. Mood & affect appropriate.     Data Reviewed:   I have personally reviewed following labs and imaging studies   CBC: Recent Labs  Lab 11/10/23 2024 11/11/23 0246  WBC  11.3* 9.9  HGB 15.5* 13.8  HCT 45.0 39.8  MCV 95.1 95.4  PLT 257 202    Basic Metabolic Panel: Recent Labs  Lab 11/10/23 2024 11/11/23 0246 11/12/23 0418 11/13/23 0357  NA 138 138 138 137  K 3.4* 3.6 2.7* 3.6  CL 102 103 103 105  CO2 19* 21* 24 23  GLUCOSE 144* 108* 102* 103*  BUN 19 15 11 13   CREATININE 0.76 0.59 0.60 0.71  CALCIUM 9.3 8.3* 8.7* 8.8*  MG  --  1.7 1.8  --   PHOS  --  2.2* 2.7  --     Liver  Function Tests: Recent Labs  Lab 11/10/23 2024 11/11/23 0246 11/12/23 0418 11/13/23 0357  AST 127* 76* 43* 38  ALT 123* 91* 66* 59*  ALKPHOS 85 67 65 65  BILITOT 1.2 0.8 0.8 0.7  PROT 8.3* 6.9 6.7 6.7  ALBUMIN  4.4 3.6 3.5 3.5    CBG: Recent Labs  Lab 11/10/23 2254  GLUCAP 110*    Microbiology Studies:   Recent Results (from the past 240 hours)  Urine Culture     Status: None   Collection Time: 11/10/23  9:32 PM   Specimen: Urine, Catheterized  Result Value Ref Range Status   Specimen Description   Final    URINE, CATHETERIZED Performed at [facility], [address], [place], [place] [address]    Special Requests   Final    NONE Performed at [facility], [address] [place], [place] [address]    Culture   Final    NO GROWTH Performed at [facility], [address] [place], [place] [address]    Report Status 11/12/2023 FINAL  Final    Radiology Studies:  No results found.  Scheduled Meds:    amLODipine   10 mg Oral Daily   DULoxetine   60 mg Oral QAC breakfast   enoxaparin  (LOVENOX ) injection  40 mg Subcutaneous Q24H   fenofibrate   160 mg Oral Daily   levothyroxine   100 mcg Oral Q0600   pantoprazole   40 mg Oral Daily   pregabalin   200 mg Oral TID    Continuous Infusions:       LOS: 3 days     Trenda Mar, MD,  FACP, [facility], [facility], [facility]   [facility] [place]      To contact the attending provider between 7A-7P or the covering provider during after hours 7P-7A, please log into the web site [email or web address] and access using universal [place] password for that web site. If you do not have the password, please call the hospital operator.  11/13/2023, 11:20 AM

## 2023-11-14 DIAGNOSIS — K529 Noninfective gastroenteritis and colitis, unspecified: Secondary | ICD-10-CM | POA: Diagnosis not present

## 2023-11-14 NOTE — Discharge Summary (Addendum)
 Physician Discharge Summary  [name] FMW:[id number] DOB: January 09, 1941  PCP: Shona Norleen PEDLAR, MD  Admitted from: ILF Discharged to: SNF  Admit date: 11/10/2023 Discharge date: 11/15/2023  Recommendations for Outpatient Follow-up:    Follow-up Information     MD at SNF Follow up.   Why: To be seen in 3 to 4 days with repeat labs (CBC & CMP)        Shona Norleen PEDLAR, MD. Schedule an appointment as soon as possible for a visit.   Specialty: Internal Medicine Why: To be seen upon discharge from SNF. Contact information: [address] [place] [address] [phone]                  Home Health: None    Equipment/Devices: TBD at SNF    Discharge Condition: Improved and stable.   Code Status: Limited: Do not attempt resuscitation (DNR) -DNR-LIMITED -Do Not Intubate/DNI  Diet recommendation:  Discharge Diet Orders (From admission, onward)     Start     Ordered   11/14/23 0000  Diet - low sodium heart healthy        11/14/23 1430             Discharge Diagnoses:  Principal Problem:   Intractable nausea and vomiting Active Problems:   Acquired hypothyroidism   GERD   Dehydration   UTI (urinary tract infection)   Hyperglycemia   Obesity, Class I, BMI 30-34.9   Essential hypertension   Mixed hyperlipidemia   Brief Hospital Course:  83 y.o. female, resident of her retirement home, ambulates with the help of a walker with medical history significant of hypertension, hyperlipidemia, hypothyroidism, GERD, left foot drop who presents to the emergency department with complaints of  2-day onset of vomiting, diarrhea, left lower abdominal pain.  Patient was admitted with intractable nausea and vomiting along with diarrhea likely related to viral gastroenteritis.  She denies eating anything unusual and cannot tell if any other people in the retirement community had similar symptoms.   Patient was medically stable for DC to SNF as of 8/23 but SNF will not be  able to accept her until Monday 8/25 and insurance authorization for SNF was approved on 8/24.     Assessment & Plan:    Acute viral gastroenteritis Presented with intractable nausea, vomiting and diarrhea Self-limited and resolved.     Hypokalemia Secondary to earlier GI losses and poor oral intake Replaced.  Magnesium 1.8   Mild transaminitis ?  Related to acute GE.  Improved and almost back to normal.   Ruled out UTI Had no UTI symptoms, did get empiric IV ceftriaxone  x 3 doses. Urine culture shows no growth Discontinued ceftriaxone    Ruled out DM or pre-DM A1c 5.6. Patient on low-dose metformin 500 mg daily, continue at discharge with close outpatient follow-up with PCP to determine if there is a need to continue this medication and if not to consider discontinuing.   Essential hypertension Continue amlodipine , controlled   Mixed hyperlipidemia Patient was not taking Tricor  PTA.   Acquired hypothyroidism Continue Synthroid    GERD Continue PPI  Chronic constipation Patient states that even at baseline, she does not have BMs every day, maybe every other day and at times has to use OTC stool softeners including Colace. No obstructive symptoms or signs.  Will give a dose of MiraLAX  PTA and continue Colace at SNF.   Body mass index is 29.27 kg/m./Overweight     Consultants:   None  Procedures:       Discharge Instructions  Discharge Instructions     Call MD for:   Complete by: As directed    Recurrent diarrhea.   Call MD for:  extreme fatigue   Complete by: As directed    Call MD for:  persistant dizziness or light-headedness   Complete by: As directed    Call MD for:  persistant nausea and vomiting   Complete by: As directed    Call MD for:  severe uncontrolled pain   Complete by: As directed    Call MD for:  temperature >100.4   Complete by: As directed    Diet - low sodium heart healthy   Complete by: As directed    Increase activity slowly    Complete by: As directed         Medication List     STOP taking these medications    cephALEXin  500 MG capsule Commonly known as: KEFLEX    fenofibrate  145 MG tablet Commonly known as: TRICOR    ondansetron  4 MG disintegrating tablet Commonly known as: ZOFRAN -ODT       TAKE these medications    amLODipine  10 MG tablet Commonly known as: NORVASC  amlodipine  10 mg tablet  TAKE ONE TABLET BY MOUTH EVERY DAY   docusate sodium  100 MG capsule Commonly known as: COLACE Take 1 capsule (100 mg total) by mouth 2 (two) times daily.   DULoxetine  60 MG capsule Commonly known as: CYMBALTA  Take 60 mg by mouth daily before breakfast.   FISH OIL PO Take by mouth.   levothyroxine  100 MCG tablet Commonly known as: SYNTHROID  Take 100 mcg by mouth daily before breakfast.   Melatonin 10 MG Tabs melatonin 10 mg tablet  Take 1 tablet every day by oral route as needed.   metFORMIN 500 MG 24 hr tablet Commonly known as: GLUCOPHAGE-XR Take 500 mg by mouth daily.   multivitamin tablet Take 1 tablet by mouth daily.   omeprazole 20 MG capsule Commonly known as: PRILOSEC Take 20 mg by mouth daily.   pregabalin  200 MG capsule Commonly known as: LYRICA  Take 200 mg by mouth 3 (three) times daily.       Allergies  Allergen Reactions   Sulfonamide Derivatives Shortness Of Breath and Rash    Rash on chest   Adhesive [Tape]     Creates skin tears      Procedures/Studies: No results found.    Subjective: No complaints reported.  Tolerating diet without nausea, vomiting, abdominal distention, abdominal pain.  Chronic constipation as noted above.  States that she has been ambulating with the help of a walker, has avoided using the bedside commode and walks to the toilet over the last more than 24 hours.  Anxious to DC.  Suggested possible Dulcolax suppository, declined stating that it might cause her to have diarrhea.  Wishes to try p.o. meds.  Discharge Exam:  Vitals:    11/14/23 0414 11/14/23 1406 11/14/23 2002 11/15/23 0427  BP: (!) 101/52 (!) 121/52 (!) 122/55 134/69  Pulse: 75 74 72 78  Resp: 18 20 18    Temp: 98.8 F (37.1 C) 98.6 F (37 C) 98.2 F (36.8 C) (!) 97.5 F (36.4 C)  TempSrc: Axillary Oral Oral Oral  SpO2: 94% 99% 98% 96%  Weight:      Height:        General exam: Elderly female, moderately built and nourished sitting up comfortably in chair without distress.  Oral mucosa moist. Respiratory system: Clear to  auscultation. Respiratory effort normal. Cardiovascular system: S1 & S2 heard, RRR. No JVD, murmurs, rubs, gallops or clicks.  No pedal edema.  Not on telemetry. Gastrointestinal system: Abdomen is nondistended, soft and nontender.  No organomegaly or masses appreciated.  Normal bowel sounds heard. Central nervous system: Alert and oriented. No focal neurological deficits. Extremities: Symmetric 5 x 5 power.  Left foot drop noted, chronic. Skin: No rashes, lesions or ulcers Psychiatry: Judgement and insight appear normal. Mood & affect appropriate.     The results of significant diagnostics from this hospitalization (including imaging, microbiology, ancillary and laboratory) are listed below for reference.     Microbiology: Recent Results (from the past 240 hours)  Urine Culture     Status: None   Collection Time: 11/10/23  9:32 PM   Specimen: Urine, Catheterized  Result Value Ref Range Status   Specimen Description   Final    URINE, CATHETERIZED Performed at [facility], [address], [place], [place] [address]    Special Requests   Final    NONE Performed at [facility], [address], [place], [place] [address]    Culture   Final    NO GROWTH Performed at [facility], [address] [place], [place] [address]    Report Status 11/12/2023 FINAL  Final     Labs: CBC: Recent Labs  Lab 11/10/23 2024 11/11/23 0246  WBC 11.3* 9.9  HGB 15.5* 13.8  HCT 45.0 39.8  MCV 95.1 95.4  PLT 257 202     Basic Metabolic Panel: Recent Labs  Lab 11/10/23 2024 11/11/23 0246 11/12/23 0418 11/13/23 0357  NA 138 138 138 137  K 3.4* 3.6 2.7* 3.6  CL 102 103 103 105  CO2 19* 21* 24 23  GLUCOSE 144* 108* 102* 103*  BUN 19 15 11 13   CREATININE 0.76 0.59 0.60 0.71  CALCIUM 9.3 8.3* 8.7* 8.8*  MG  --  1.7 1.8  --   PHOS  --  2.2* 2.7  --     Liver Function Tests: Recent Labs  Lab 11/10/23 2024 11/11/23 0246 11/12/23 0418 11/13/23 0357  AST 127* 76* 43* 38  ALT 123* 91* 66* 59*  ALKPHOS 85 67 65 65  BILITOT 1.2 0.8 0.8 0.7  PROT 8.3* 6.9 6.7 6.7  ALBUMIN  4.4 3.6 3.5 3.5    CBG: Recent Labs  Lab 11/10/23 2254  GLUCAP 110*     Urinalysis    Component Value Date/Time   COLORURINE YELLOW 11/10/2023 2132   APPEARANCEUR CLEAR 11/10/2023 2132   LABSPEC 1.010 11/10/2023 2132   PHURINE 6.0 11/10/2023 2132   GLUCOSEU NEGATIVE 11/10/2023 2132   HGBUR NEGATIVE 11/10/2023 2132   BILIRUBINUR NEGATIVE 11/10/2023 2132   KETONESUR 20 (A) 11/10/2023 2132   PROTEINUR 100 (A) 11/10/2023 2132   UROBILINOGEN 0.2 02/24/2008 1101   NITRITE NEGATIVE 11/10/2023 2132   LEUKOCYTESUR NEGATIVE 11/10/2023 2132      Time coordinating discharge: 25 minutes  SIGNED:  Trenda Mar, MD,  FACP, [facility], [facility], [facility]   [facility] Advisor [place]     To contact the attending provider between 7A-7P or the covering provider during after hours 7P-7A, please log into the web site [email or web address] and access using universal  password for that web site. If you do not have the password, please call the hospital operator.

## 2023-11-14 NOTE — TOC CM/SW Note (Signed)
 Insurance auth approved for [employer], expires 11/17/2023. Auth #: [id number]

## 2023-11-14 NOTE — Plan of Care (Signed)
  Problem: Health Behavior/Discharge Planning: Goal: Ability to manage health-related needs will improve Outcome: Progressing   Problem: Clinical Measurements: Goal: Ability to maintain clinical measurements within normal limits will improve Outcome: Progressing Goal: Will remain free from infection Outcome: Progressing   Problem: Elimination: Goal: Will not experience complications related to urinary retention Outcome: Progressing   Problem: Pain Managment: Goal: General experience of comfort will improve and/or be controlled Outcome: Progressing   Problem: Safety: Goal: Ability to remain free from injury will improve Outcome: Progressing

## 2023-11-14 NOTE — Progress Notes (Signed)
 Pt refused Lovenox  injections, feels she is actively being mobile around her room with a walker to the bathroom. MD notified

## 2023-11-14 NOTE — Discharge Instructions (Signed)

## 2023-11-15 ENCOUNTER — Other Ambulatory Visit: Payer: Self-pay | Admitting: Adult Health

## 2023-11-15 DIAGNOSIS — I152 Hypertension secondary to endocrine disorders: Secondary | ICD-10-CM | POA: Diagnosis not present

## 2023-11-15 DIAGNOSIS — E860 Dehydration: Secondary | ICD-10-CM | POA: Diagnosis not present

## 2023-11-15 DIAGNOSIS — K219 Gastro-esophageal reflux disease without esophagitis: Secondary | ICD-10-CM | POA: Diagnosis not present

## 2023-11-15 DIAGNOSIS — R112 Nausea with vomiting, unspecified: Secondary | ICD-10-CM | POA: Diagnosis not present

## 2023-11-15 DIAGNOSIS — F5104 Psychophysiologic insomnia: Secondary | ICD-10-CM | POA: Diagnosis not present

## 2023-11-15 DIAGNOSIS — K5909 Other constipation: Secondary | ICD-10-CM | POA: Diagnosis not present

## 2023-11-15 DIAGNOSIS — E876 Hypokalemia: Secondary | ICD-10-CM | POA: Diagnosis not present

## 2023-11-15 DIAGNOSIS — A084 Viral intestinal infection, unspecified: Secondary | ICD-10-CM | POA: Diagnosis not present

## 2023-11-15 DIAGNOSIS — E119 Type 2 diabetes mellitus without complications: Secondary | ICD-10-CM | POA: Diagnosis not present

## 2023-11-15 DIAGNOSIS — M797 Fibromyalgia: Secondary | ICD-10-CM | POA: Diagnosis not present

## 2023-11-15 DIAGNOSIS — N390 Urinary tract infection, site not specified: Secondary | ICD-10-CM | POA: Diagnosis not present

## 2023-11-15 DIAGNOSIS — M542 Cervicalgia: Secondary | ICD-10-CM | POA: Diagnosis not present

## 2023-11-15 DIAGNOSIS — E782 Mixed hyperlipidemia: Secondary | ICD-10-CM | POA: Diagnosis not present

## 2023-11-15 DIAGNOSIS — E785 Hyperlipidemia, unspecified: Secondary | ICD-10-CM | POA: Diagnosis not present

## 2023-11-15 DIAGNOSIS — R739 Hyperglycemia, unspecified: Secondary | ICD-10-CM | POA: Diagnosis not present

## 2023-11-15 DIAGNOSIS — E039 Hypothyroidism, unspecified: Secondary | ICD-10-CM | POA: Diagnosis not present

## 2023-11-15 DIAGNOSIS — I10 Essential (primary) hypertension: Secondary | ICD-10-CM | POA: Diagnosis not present

## 2023-11-15 DIAGNOSIS — R251 Tremor, unspecified: Secondary | ICD-10-CM | POA: Diagnosis not present

## 2023-11-15 DIAGNOSIS — E1159 Type 2 diabetes mellitus with other circulatory complications: Secondary | ICD-10-CM | POA: Diagnosis not present

## 2023-11-15 DIAGNOSIS — R197 Diarrhea, unspecified: Secondary | ICD-10-CM | POA: Diagnosis not present

## 2023-11-15 MED ORDER — DOCUSATE SODIUM 100 MG PO CAPS
100.0000 mg | ORAL_CAPSULE | Freq: Two times a day (BID) | ORAL | Status: DC
  Administered 2023-11-15: 100 mg via ORAL
  Filled 2023-11-15: qty 1

## 2023-11-15 MED ORDER — POLYETHYLENE GLYCOL 3350 17 G PO PACK
17.0000 g | PACK | Freq: Once | ORAL | Status: AC
  Administered 2023-11-15: 17 g via ORAL
  Filled 2023-11-15: qty 1

## 2023-11-15 MED ORDER — DOCUSATE SODIUM 100 MG PO CAPS: 100.0000 mg | ORAL_CAPSULE | Freq: Two times a day (BID) | ORAL | Status: DC

## 2023-11-15 MED ORDER — PREGABALIN 200 MG PO CAPS: 200.0000 mg | ORAL_CAPSULE | Freq: Three times a day (TID) | ORAL | 0 refills | Status: AC

## 2023-11-15 NOTE — Plan of Care (Signed)
  Problem: Health Behavior/Discharge Planning: Goal: Ability to manage health-related needs will improve Outcome: Progressing   Problem: Clinical Measurements: Goal: Ability to maintain clinical measurements within normal limits will improve Outcome: Progressing Goal: Will remain free from infection Outcome: Progressing Goal: Diagnostic test results will improve Outcome: Progressing   Problem: Coping: Goal: Level of anxiety will decrease Outcome: Progressing   Problem: Elimination: Goal: Will not experience complications related to urinary retention Outcome: Progressing   Problem: Pain Managment: Goal: General experience of comfort will improve and/or be controlled Outcome: Progressing   Problem: Safety: Goal: Ability to remain free from injury will improve Outcome: Progressing   Problem: Skin Integrity: Goal: Risk for impaired skin integrity will decrease Outcome: Progressing

## 2023-11-15 NOTE — Progress Notes (Signed)
 Pt refuses Lovenox  injection. Stated she ambulates with front wheel walker to and from bathroom. Feels she doesn't need them. Expected DC today to [facility].

## 2023-11-15 NOTE — Care Management Important Message (Signed)
 Important Message  Patient Details  Name: [name] MRN: [id number] Date of Birth: 28-Aug-1940   Important Message Given:  Yes - Medicare IM     Shed Nixon L Xavia Kniskern 11/15/2023, 11:04 AM

## 2023-11-15 NOTE — TOC Transition Note (Signed)
 Transition of Care [facility]) - Discharge Note   Patient Details  Name: [name] MRN: [id number] Date of Birth: [birth year]  Transition of Care [facility]) CM/SW Contact:  Noreen KATHEE Cleotilde ISRAEL Phone Number: 11/15/2023, 9:17 AM   Clinical Narrative:     Patient is up for DC today to [facility]. CSW spoke with daughter and updated her on DC. Patient will go to room 125 and number to call for report is [phone]. Nurse updated. TOC signing off.   Final next level of care: Skilled Nursing Facility Barriers to Discharge: Barriers Resolved   Patient Goals and CMS Choice Patient states their goals for this hospitalization and ongoing recovery are:: DC to SNF for rehab CMS Medicare.gov Compare Post Acute Care list provided to:: Patient Represenative (must comment) (Jenna) Choice offered to / list presented to : Adult Children [place] ownership interest in [facility].provided to:: Adult Children    Discharge Placement              Patient chooses bed at: [facility] Patient to be transferred to facility by: stafff Name of family member notified: Jenna- daughter Patient and family notified of of transfer: 11/15/23  Discharge Plan and Services Additional resources added to the After Visit Summary for       Post Acute Care Choice: Durable Medical Equipment                               Social Drivers of Health (SDOH) Interventions SDOH Screenings   Food Insecurity: No Food Insecurity (11/11/2023)  Housing: Low Risk  (11/11/2023)  Transportation Needs: No Transportation Needs (11/11/2023)  Utilities: Not At Risk (11/11/2023)  Social Connections: Moderately Isolated (11/11/2023)  Tobacco Use: Low Risk  (11/10/2023)     Readmission Risk Interventions    11/15/2023    9:14 AM 11/12/2023    3:47 PM 11/12/2023   10:26 AM  Readmission Risk Prevention Plan  Medication Screening Complete Complete Complete  Transportation Screening Complete Complete Complete

## 2023-11-15 NOTE — Progress Notes (Signed)
 TRH progress note  Evaluated this morning prior to to DC to SNF  Updated DC summary with today's evaluation and management  No new symptoms.  Vital signs and physical exam stable.  No new labs to review.  Plan for a dose of MiraLAX  and Colace and then DC to SNF  Trenda Mar, MD,  FACP, [facility], [facility], [facility]   [facility] Physician Advisor [place]     To contact the attending provider between 7A-7P or the covering provider during after hours 7P-7A, please log into the web site [email or web address] and access using universal [place] password for that web site. If you do not have the password, please call the hospital operator.

## 2023-11-16 ENCOUNTER — Encounter: Payer: Self-pay | Admitting: Adult Health

## 2023-11-16 ENCOUNTER — Non-Acute Institutional Stay (SKILLED_NURSING_FACILITY): Payer: Self-pay | Admitting: Adult Health

## 2023-11-16 DIAGNOSIS — K5909 Other constipation: Secondary | ICD-10-CM | POA: Insufficient documentation

## 2023-11-16 DIAGNOSIS — I10 Essential (primary) hypertension: Secondary | ICD-10-CM | POA: Diagnosis not present

## 2023-11-16 DIAGNOSIS — K219 Gastro-esophageal reflux disease without esophagitis: Secondary | ICD-10-CM

## 2023-11-16 DIAGNOSIS — M797 Fibromyalgia: Secondary | ICD-10-CM

## 2023-11-16 DIAGNOSIS — E119 Type 2 diabetes mellitus without complications: Secondary | ICD-10-CM | POA: Diagnosis not present

## 2023-11-16 DIAGNOSIS — M542 Cervicalgia: Secondary | ICD-10-CM | POA: Diagnosis not present

## 2023-11-16 DIAGNOSIS — R112 Nausea with vomiting, unspecified: Secondary | ICD-10-CM | POA: Diagnosis not present

## 2023-11-16 DIAGNOSIS — E039 Hypothyroidism, unspecified: Secondary | ICD-10-CM | POA: Diagnosis not present

## 2023-11-16 NOTE — Progress Notes (Unsigned)
 Location:  [facility] Room Number: 125-P Place of Service:  SNF (31)   CODE STATUS: dnr   Allergies  Allergen Reactions   Sulfonamide Derivatives Shortness Of Breath and Rash    Rash on chest   Adhesive [Tape]     Creates skin tears    Chief Complaint  Patient presents with   Hospitalization Follow-up    HPI:  She is a 83 year old woman who has been hospitalized from 11-10-23 through 11-15-23. Her past medical history includes: gerd; type 2 diabetes mellitus; hypertension.  She presented to the ED for nausea and vomiting. Her urine culture came back negative for growth. It is felt that this is related to viral gastritis. She is here for short term rehab with her goal to return home. She is complaining of neck pain with movement. She will continue to be followed for her chronic illnesses including: Hypertension: Acquired hypothyroidism:  Fibromyalgia  Past Medical History:  Diagnosis Date   Anxiety    Arthritis    hands, back    Chronic back pain    Depression    Diverticulosis    Fatty liver    Fibromyalgia    GERD (gastroesophageal reflux disease) 2006   EGD by Dr. Mavis, reports that she has had reflux quite markedly in the past but its improved since taking medicine    Headache    use to have migraines when she was younger     Hemorrhage after delivery of fetus    Hyperglycemia    Hypertension    reports that she takes for diuretic    Hypothyroid    Intractable nausea and vomiting 11/10/2023   Pre-diabetes    S/P colonoscopy 2006   Dr. Mavis    Past Surgical History:  Procedure Laterality Date   APPENDECTOMY     BREAST BIOPSY Right    cataracts Bilateral    CHOLECYSTECTOMY  1998   COLONOSCOPY WITH PROPOFOL  N/A 04/26/2014   Procedure: COLONOSCOPY WITH PROPOFOL ;  Surgeon: Lamar CHRISTELLA Hollingshead, MD;  Location: [facility];  Service: Endoscopy;  Laterality: N/A;  0939 in cecum, total withdrawal time, 10 min   LUMBAR DISC SURGERY  1980's   LUMBAR  LAMINECTOMY/DECOMPRESSION MICRODISCECTOMY N/A 05/10/2017   Procedure: L3-4 DECOMPRESSION, RIGHT MICRODISCECTOMY;  Surgeon: Barbarann Oneil BROCKS, MD;  Location: [facility] OR;  Service: Orthopedics;  Laterality: N/A;   TOTAL ABDOMINAL HYSTERECTOMY W/ BILATERAL SALPINGOOPHORECTOMY      Social History   Socioeconomic History   Marital status: Married    Spouse name: Not on file   Number of children: 3   Years of education: Not on file   Highest education level: Not on file  Occupational History   Occupation: retired    Comment: [employer]  Tobacco Use   Smoking status: Never   Smokeless tobacco: Never  Vaping Use   Vaping status: Never Used  Substance and Sexual Activity   Alcohol use: No   Drug use: No   Sexual activity: Never  Other Topics Concern   Not on file  Social History Narrative   Not on file   Social Drivers of Health   Financial Resource Strain: Not on file  Food Insecurity: No Food Insecurity (11/11/2023)   Hunger Vital Sign    Worried About Running Out of Food in the Last Year: Never true    Ran Out of Food in the Last Year: Never true  Transportation Needs: No Transportation Needs (11/11/2023)  PRAPARE - Administrator, Civil Service (Medical): No    Lack of Transportation (Non-Medical): No  Physical Activity: Not on file  Stress: Not on file  Social Connections: Moderately Isolated (11/11/2023)   Social Connection and Isolation Panel    Frequency of Communication with Friends and Family: Three times a week    Frequency of Social Gatherings with Friends and Family: Three times a week    Attends Religious Services: Patient declined    Active Member of Clubs or Organizations: Yes    Attends Banker Meetings: Patient declined    Marital Status: Widowed  Intimate Partner Violence: Not At Risk (11/11/2023)   Humiliation, Afraid, Rape, and Kick questionnaire    Fear of Current or Ex-Partner: No    Emotionally Abused: No     Physically Abused: No    Sexually Abused: No   Family History  Problem Relation Age of Onset   Brain cancer Father    Coronary artery disease Mother    Prostate cancer Brother    Cancer Sister        gyn   Lung cancer Brother    Seizures Son    Pancreatitis Daughter        ?gallstones   Colon cancer Neg Hx       VITAL SIGNS BP 132/76   Pulse 79   Temp 98.1 F (36.7 C)   Ht 5' 2 (1.575 m)   Wt 166 lb 12.8 oz (75.7 kg)   SpO2 96%   BMI 30.51 kg/m   Outpatient Encounter Medications as of 11/16/2023  Medication Sig   amLODipine  (NORVASC ) 10 MG tablet amlodipine  10 mg tablet  TAKE ONE TABLET BY MOUTH EVERY DAY   DULoxetine  (CYMBALTA ) 60 MG capsule Take 60 mg by mouth daily before breakfast.    levothyroxine  (SYNTHROID , LEVOTHROID) 100 MCG tablet Take 100 mcg by mouth daily before breakfast.   melatonin 5 MG TABS Take 5 mg by mouth at bedtime.   metFORMIN (GLUCOPHAGE-XR) 500 MG 24 hr tablet Take 500 mg by mouth daily.   Multiple Vitamin (MULTIVITAMIN) tablet Take 1 tablet by mouth daily.   Omega-3 Fatty Acids (FISH OIL PO) Take by mouth.   omeprazole (PRILOSEC) 20 MG capsule Take 20 mg by mouth daily.    pregabalin  (LYRICA ) 200 MG capsule Take 1 capsule (200 mg total) by mouth 3 (three) times daily.   senna (SENOKOT) 8.6 MG tablet Take 1 tablet by mouth 2 (two) times daily.   [DISCONTINUED] docusate sodium  (COLACE) 100 MG capsule Take 1 capsule (100 mg total) by mouth 2 (two) times daily. (Patient not taking: Reported on 11/16/2023)   [DISCONTINUED] Melatonin 10 MG TABS melatonin 10 mg tablet  Take 1 tablet every day by oral route as needed. (Patient not taking: Reported on 11/16/2023)   No facility-administered encounter medications on file as of 11/16/2023.     SIGNIFICANT DIAGNOSTIC EXAMS  LABS 11-10-23: wbc 11.3; hgb 15.5; hct 45.0; mcv 95.1 plt 257; glucose 144; bun 19; creat 0.76; k+ 3.4; na++ 138; ca 9.3 gfr >60 protein 8.3 albumin  4.4 ast 127; alt 123  urine  culture: no growth 11-13-23: glucose 103; bun 13; creat 0.71; k+ 3.6; na++ 137; ca 8.8; gfr >60; protein 6.7 albumin  3.5  Review of Systems  Constitutional:  Negative for malaise/fatigue.  Respiratory:  Negative for cough and shortness of breath.   Cardiovascular:  Negative for chest pain, palpitations and leg swelling.  Gastrointestinal:  Negative for abdominal pain,  constipation and heartburn.  Musculoskeletal:  Positive for myalgias. Negative for back pain and joint pain.       Neck pain   Skin: Negative.   Neurological:  Negative for dizziness.  Psychiatric/Behavioral:  The patient is not nervous/anxious.    Physical Exam Constitutional:      General: She is not in acute distress.    Appearance: She is well-developed. She is not diaphoretic.  Neck:     Thyroid : No thyromegaly.  Cardiovascular:     Rate and Rhythm: Normal rate and regular rhythm.     Heart sounds: Normal heart sounds.  Pulmonary:     Effort: Pulmonary effort is normal. No respiratory distress.     Breath sounds: Normal breath sounds.  Abdominal:     General: Bowel sounds are normal. There is no distension.     Palpations: Abdomen is soft.     Tenderness: There is no abdominal tenderness.  Musculoskeletal:        General: Normal range of motion.     Cervical back: Neck supple.     Right lower leg: No edema.     Left lower leg: No edema.     Comments: Has limited range of motion to neck   Lymphadenopathy:     Cervical: No cervical adenopathy.  Skin:    General: Skin is warm and dry.  Neurological:     Mental Status: She is alert and oriented to person, place, and time.  Psychiatric:        Mood and Affect: Mood normal.       ASSESSMENT/ PLAN:  TODAY  GERD without esophagitis and intractable nausea and vomiting: will continue prilosec 20 mg daily nausea and vomiting has resolved  2. Hypertension: will continue norvasc  10 mg daily   3. Acquired hypothyroidism: will continue synthroid  100 mcg  daily   4. Fibromyalgia: will continue cymbalta  60 mg daily and lyrica  200 mg three times daily   5. Chronic constipation: will continue senna twice daily   6. Type 2 diabetes mellitus without obesity: will continue metformin xr 500 mg daily   7. Neck pain will begin voltaren gel 4 gm twice daily to neck    Barnie Seip NP [facility]   call [phone]

## 2023-11-17 ENCOUNTER — Encounter: Payer: Self-pay | Admitting: Internal Medicine

## 2023-11-17 ENCOUNTER — Non-Acute Institutional Stay (SKILLED_NURSING_FACILITY): Payer: Self-pay | Admitting: Internal Medicine

## 2023-11-17 DIAGNOSIS — R739 Hyperglycemia, unspecified: Secondary | ICD-10-CM | POA: Diagnosis not present

## 2023-11-17 DIAGNOSIS — R112 Nausea with vomiting, unspecified: Secondary | ICD-10-CM | POA: Diagnosis not present

## 2023-11-17 DIAGNOSIS — I10 Essential (primary) hypertension: Secondary | ICD-10-CM

## 2023-11-17 DIAGNOSIS — N390 Urinary tract infection, site not specified: Secondary | ICD-10-CM

## 2023-11-17 DIAGNOSIS — M542 Cervicalgia: Secondary | ICD-10-CM

## 2023-11-17 NOTE — Assessment & Plan Note (Addendum)
 GI symptoms have resolved and LFTs normalized or improved dramatically.  At this time her major complaint is constipation; Senna S twice daily has been ordered.

## 2023-11-17 NOTE — Assessment & Plan Note (Addendum)
 11/10/2023 Urine C&S revealed no growth.   UTI is  erroneous diagnosis.

## 2023-11-17 NOTE — Patient Instructions (Signed)
 See assessment and plan under each diagnosis in the problem list and acutely for this visit

## 2023-11-17 NOTE — Assessment & Plan Note (Signed)
 BP controlled; no change in antihypertensive medications

## 2023-11-17 NOTE — Assessment & Plan Note (Addendum)
 While hospitalized 8/20 - 11/15/2023  glucoses ranged from a low of 102 up to a high of 144.  A1c was 5.6 % , prediabetic.  Continue FBS monitor.

## 2023-11-17 NOTE — Progress Notes (Signed)
 NURSING HOME LOCATION:  [facility] ROOM NUMBER:  125 P  CODE STATUS:  DNR  PCP:  Norleen JENEANE Hurst MD  This is a comprehensive admission note to this SNFperformed on this date less than 30 days from date of admission. Included are preadmission medical/surgical history; reconciled medication list; family history; social history and comprehensive review of systems.  Corrections and additions to the records were documented. Comprehensive physical exam was also performed. Additionally a clinical summary was entered for each active diagnosis pertinent to this admission in the Problem List to enhance continuity of care.  HPI: She was hospitalized 8/20 - 11/15/2023 presenting with intractable nausea and vomiting for 2 days associated with diarrhea.  Initially she was seen at [facility] but was only in the emergency room for few hours before being sent home.  Because of the intractability of the nausea and vomiting ,she went to [facility] ED and was admitted.  Initial potassium was 3.4; AST 127; ALT 123; total protein 8.3;  albumin  4.4 & white count 11,300. While hospitalized glucoses ranged from a low 102 up to high of 144; A1c was 5.6%.  Course was complicated by significant hypokalemia with a value of 2.7.  This was repleted and final potassium was 3.6.  Transaminitis improved with final AST of 38 & ALT of 59.  Final total protein 6.7 and albumin  3.5. UTI was suspected but cath urine C&S revealed no growth. She does has a history of hypothyroidism and is on L-thyroxine 100 mcg daily.  She states that this is checked every few months.  The most recent TSH in Epic record was 0.823 on 09/24/2015.  On 02/24/2008 TSH had been 0.219. Symptoms improved and diet was advanced.  She had been in independent living [facility]; PT/OT consulted and recommended SNF placement for rehab.  Past medical and surgical history: Includes history of chronic anxiety, chronic back pain, history of diverticulosis, history of  hepatosteatosis, fibromyalgia, GERD, essential hypertension, and hypothyroidism.  Surgeries and procedures include breast biopsy, cholecystectomy, lumbar disc surgery, TAH with BSO, and colonoscopy.  Family history: reviewed, non contributory due to advanced age.  Social history: Nondrinker, non-smoker.   Review of systems: She is an excellent historian.  She states that she is not having any nausea ,vomiting or diarrhea at this time.  She states that she has been on regular food for several days and has not had a bowel movement.  She is concerned that Colace has been discontinued; she has been placed on Senna S twice a day.  She validates that she has fibromyalgia as well as anxiety. She states that she has been on slightly higher doses of L-thyroxine for a little burst of energy.  She states that both her mother and daughter had hypothyroidism.  Today she has developed pain in the posterior neck area which radiates into the supra clavicular areas bilaterally.  She has the pain even without range of motion.  She questions whether this is related to the hospital bed.  She does have chronic numbness and tingling in her left lower extremity.  Constitutional: No fever, significant weight change  Eyes: No redness, discharge, pain, vision change ENT/mouth: No nasal congestion, purulent discharge, earache, change in hearing, sore throat  Cardiovascular: No chest pain, palpitations, paroxysmal nocturnal dyspnea, claudication, edema  Respiratory: No cough, sputum production, hemoptysis, DOE, significant snoring, apnea  Gastrointestinal: No heartburn, dysphagia, abdominal pain, rectal bleeding, melena Genitourinary: No dysuria, hematuria, pyuria, incontinence, nocturia Dermatologic: No rash, pruritus, change in  appearance of skin Neurologic: No dizziness, headache, syncope, seizures Psychiatric: No significant insomnia, anorexia Endocrine: No change in hair/skin/nails, excessive thirst, excessive  hunger, excessive urination  Hematologic/lymphatic: No significant bruising, lymphadenopathy, abnormal bleeding Allergy/immunology: No itchy/watery eyes, significant sneezing, urticaria, angioedema  Physical exam:  Pertinent or positive findings: She appears her age.  She has an upper plate and a lower partial.  There is tenderness to palpation over the posterior neck.  She also stated that there was neck discomfort when I palpated her thyroid .  Heart rate is slow and slightly irregular.  Abdomen is protuberant.  Pedal pulses are not palpable.  She has 1+ deep tendon reflexes in the upper extremities and 1/2+ in the lower extremities.  There is trace edema at the right sock line.  Slightly sclerotic skin changes are noted over the shins.  General appearance: Adequately nourished; no acute distress, increased work of breathing is present.   Lymphatic: No lymphadenopathy about the head, neck, axilla. Eyes: No conjunctival inflammation or lid edema is present. There is no scleral icterus. Ears:  External ear exam shows no significant lesions or deformities.   Nose:  External nasal examination shows no deformity or inflammation. Nasal mucosa are pink and moist without lesions, exudates Neck:  No thyromegaly, masses, tenderness noted.    Heart:  No gallop, murmur, click, rub.  Lungs: Chest clear to auscultation without wheezes, rhonchi, rales, rubs. Abdomen: Bowel sounds are normal.  Abdomen is soft and nontender with no organomegaly, hernias, masses. GU: Deferred  Extremities:  No cyanosis, clubbing. Skin: Warm & dry w/o tenting. No significant rash.  See clinical summary under each active problem in the Problem List with associated updated therapeutic plan   NURSING HOME LOCATION:  [facility] ROOM NUMBER:    CODE STATUS:    PCP:    This is a comprehensive admission note to this SNFperformed on this date less than 30 days from date of admission. Included are preadmission  medical/surgical history; reconciled medication list; family history; social history and comprehensive review of systems.  Corrections and additions to the records were documented. Comprehensive physical exam was also performed. Additionally a clinical summary was entered for each active diagnosis pertinent to this admission in the Problem List to enhance continuity of care.  HPI:  Past medical and surgical history:  Family history: reviewed, non contributory due to advanced age.  Social history:   Review of systems: Clinical neurocognitive deficits made validity of responses questionable , compromising ROS completion. Date given as Constitutional: No fever, significant weight change, fatigue  Eyes: No redness, discharge, pain, vision change ENT/mouth: No nasal congestion, purulent discharge, earache, change in hearing, sore throat  Cardiovascular: No chest pain, palpitations, paroxysmal nocturnal dyspnea, claudication, edema  Respiratory: No cough, sputum production, hemoptysis, DOE, significant snoring, apnea Gastrointestinal: No heartburn, dysphagia, abdominal pain, nausea /vomiting, rectal bleeding, melena, change in bowels Genitourinary: No dysuria, hematuria, pyuria, incontinence, nocturia Musculoskeletal: No joint stiffness, joint swelling, weakness, pain Dermatologic: No rash, pruritus, change in appearance of skin Neurologic: No dizziness, headache, syncope, seizures, numbness, tingling Psychiatric: No significant anxiety, depression, insomnia, anorexia Endocrine: No change in hair/skin/nails, excessive thirst, excessive hunger, excessive urination  Hematologic/lymphatic: No significant bruising, lymphadenopathy, abnormal bleeding Allergy/immunology: No itchy/watery eyes, significant sneezing, urticaria, angioedema  Physical exam:  Pertinent or positive findings: General appearance: Adequately nourished; no acute distress, increased work of breathing is present.   Lymphatic: No  lymphadenopathy about the head, neck, axilla. Eyes: No conjunctival inflammation or lid edema  is present. There is no scleral icterus. Ears:  External ear exam shows no significant lesions or deformities.   Nose:  External nasal examination shows no deformity or inflammation. Nasal mucosa are pink and moist without lesions, exudates Oral exam: Lips and gums are healthy appearing.There is no oropharyngeal erythema or exudate. Neck:  No thyromegaly, masses, tenderness noted.    Heart:  Normal rate and regular rhythm. S1 and S2 normal without gallop, murmur, click, rub.  Lungs: Chest clear to auscultation without wheezes, rhonchi, rales, rubs. Abdomen: Bowel sounds are normal.  Abdomen is soft and nontender with no organomegaly, hernias, masses. GU: Deferred  Extremities:  No cyanosis, clubbing, edema. Neurologic exam:  Strength equal  in upper & lower extremities. Balance, Rhomberg, finger to nose testing could not be completed due to clinical state Deep tendon reflexes are equal Skin: Warm & dry w/o tenting. No significant lesions or rash.  See clinical summary under each active problem in the Problem List with associated updated therapeutic plan

## 2023-11-25 ENCOUNTER — Non-Acute Institutional Stay (SKILLED_NURSING_FACILITY): Payer: Self-pay | Admitting: Internal Medicine

## 2023-11-25 ENCOUNTER — Encounter: Payer: Self-pay | Admitting: Internal Medicine

## 2023-11-25 DIAGNOSIS — R251 Tremor, unspecified: Secondary | ICD-10-CM | POA: Insufficient documentation

## 2023-11-25 DIAGNOSIS — F5104 Psychophysiologic insomnia: Secondary | ICD-10-CM | POA: Diagnosis not present

## 2023-11-25 DIAGNOSIS — R197 Diarrhea, unspecified: Secondary | ICD-10-CM

## 2023-11-25 DIAGNOSIS — R112 Nausea with vomiting, unspecified: Secondary | ICD-10-CM

## 2023-11-25 DIAGNOSIS — I10 Essential (primary) hypertension: Secondary | ICD-10-CM

## 2023-11-25 NOTE — Assessment & Plan Note (Signed)
 Systolic blood pressure is minimally elevated.  I have asked her to verify the average rather than addressing outliers in order to allow optimal antihypertensive management.

## 2023-11-25 NOTE — Assessment & Plan Note (Signed)
 11/24/2023 - abdominal cramps and loose stool.  Probiotic recommended if persistent or recurrent.

## 2023-11-25 NOTE — Assessment & Plan Note (Signed)
 It is recommended that she continue low-dose melatonin .

## 2023-11-25 NOTE — Progress Notes (Signed)
 NURSING HOME LOCATION:  [facility] ROOM NUMBER:    CODE STATUS:    PCP: Shona Norleen PEDLAR, MD   This is a nursing facility follow up visit for possible discharge 11/26/2023.  Interim medical record and care since last SNF visit was updated with review of diagnostic studies and change in clinical status since last visit were documented.  HPI: She was admitted to this facility for rehab 11/15/2023 after being hospitalized 8/20 - 8/25 with intractable nausea and vomiting for 2 days prior to admission.  There was also associated with diarrhea.  Transaminitis was documented with an AST of 127 and an ALT of 123.  White count was elevated at 11,300.  Course was complicated by significant hypokalemia with a nadir value of 2.7.  This was repleted and final value was 3.6.  Transaminitis also improved with final AST of 38 and ALT of 59. Although UTI was suspected; cath urine and CNS revealed no growth. Here at the SNF she progressed with PT/OT.  She is able to ambulate 200 feet with a rolling walker.  ADLs were performed with supervision.  She was independent with transfers and mobility. MMSE was normal with a SLUMS score of 30 out of 30. She is to be discharged back to [facility].  Review of systems: She states that she is doing very well and I am very pleased.  She did have mild stomach cramps and loose stool yesterday.  She continues to have difficulty sleeping.  She takes melatonin at home.  She does describe tremor of both hands, apparently worse with intentional movements.  She states that she will shake the food off my fork at times.  She is reluctant to start a new medication such as low-dose nonselective beta-blocker for this at this time.  Constitutional: No fever, significant weight change, fatigue  Eyes: No redness, discharge, pain, vision change ENT/mouth: No nasal congestion,  purulent discharge, earache, change in hearing, sore throat  Cardiovascular: No  chest pain, palpitations, paroxysmal nocturnal dyspnea, claudication, edema  Respiratory: No cough, sputum production, hemoptysis, DOE, significant snoring, apnea   Gastrointestinal: No heartburn, dysphagia,  nausea /vomiting, rectal bleeding, melena Genitourinary: No dysuria, hematuria, pyuria, incontinence, nocturia Musculoskeletal: No joint stiffness, joint swelling, weakness, pain Dermatologic: No rash, pruritus, change in appearance of skin Neurologic: No dizziness, headache, syncope, seizures, numbness, tingling Psychiatric: No significant anxiety, depression, anorexia Endocrine: No change in hair/skin/nails, excessive thirst, excessive hunger, excessive urination  Hematologic/lymphatic: No significant bruising, lymphadenopathy, abnormal bleeding Allergy/immunology: No itchy/watery eyes, significant sneezing, urticaria, angioedema  Physical exam:  Pertinent or positive findings: She is alert and oriented and obviously very intelligent.  When I entered the room she was playing solitaire on her iPhone.  She does exhibit bilateral tremor; this appears slightly worse in the left hand.  There is slight increase in S1; a slight gallop cadence is suggested.  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  Pedal pulses are decreased.  General appearance: Adequately nourished; no acute distress, increased work of breathing is present.   Lymphatic: No lymphadenopathy about the head, neck, axilla. Eyes: No conjunctival inflammation or lid edema is present. There is no scleral icterus. Ears:  External ear exam shows no significant lesions or deformities.   Nose:  External nasal examination shows no deformity or inflammation. Nasal mucosa are pink and moist without lesions, exudates Neck:  No thyromegaly, masses, tenderness noted.    Heart:  No murmur, click, rub .  Lungs:  Chest clear to auscultation without wheezes, rhonchi, rales, rubs. Abdomen: Bowel sounds are normal. Abdomen is soft  and nontender with no organomegaly, hernias, masses. GU: Deferred  Extremities:  No cyanosis, clubbing  Skin: Warm & dry w/o tenting. No significant lesions or rash.  See summary under each active problem in the Problem List with associated updated therapeutic plan

## 2023-11-25 NOTE — Patient Instructions (Signed)
 See assessment and plan under each diagnosis in the problem list and acutely for this visit

## 2023-11-25 NOTE — Assessment & Plan Note (Addendum)
 GI symptoms have essentially resolved completely.  On 9/3 she had minor abdominal cramps with associated loose stool. I recommended she take a probiotic if stools are loose. I reiterated that the diagnosis of UTI is erroneous as the culture revealed no growth.  Clinically it appears she may have had a viral gastroenteritis with associated transaminitis.

## 2023-11-26 ENCOUNTER — Non-Acute Institutional Stay (SKILLED_NURSING_FACILITY): Payer: Self-pay | Admitting: Adult Health

## 2023-11-26 ENCOUNTER — Encounter: Payer: Self-pay | Admitting: Adult Health

## 2023-11-26 DIAGNOSIS — E119 Type 2 diabetes mellitus without complications: Secondary | ICD-10-CM

## 2023-11-26 DIAGNOSIS — R112 Nausea with vomiting, unspecified: Secondary | ICD-10-CM

## 2023-11-26 DIAGNOSIS — I10 Essential (primary) hypertension: Secondary | ICD-10-CM | POA: Diagnosis not present

## 2023-11-26 DIAGNOSIS — E782 Mixed hyperlipidemia: Secondary | ICD-10-CM | POA: Diagnosis not present

## 2023-11-26 NOTE — Progress Notes (Signed)
 Location:  [facility] Room Number: 122 Place of Service:  SNF (31)   CODE STATUS: DNR Managed Care  Allergies  Allergen Reactions   Sulfonamide Derivatives Shortness Of Breath and Rash    Rash on chest   Adhesive [Tape]     Creates skin tears    Chief Complaint  Patient presents with   Discharge Note    HPI:  She is being discharged to independent living. She will need a front wheel walker; home health; will need to follow up with her medical provider and will need to have her prescriptions written. She had been hospitalized for viral gastritis and was admitted to this facility for short term rehab. Therapy: ambulate 200 feet with rolling walker; upper body setup lower body supervision; brp: mod assist.   Past Medical History:  Diagnosis Date   Anxiety    Arthritis    hands, back    Chronic back pain    Depression    Diverticulosis    Fatty liver    Fibromyalgia    GERD (gastroesophageal reflux disease) 2006   EGD by Dr. Mavis, reports that she has had reflux quite markedly in the past but its improved since taking medicine    Headache    use to have migraines when she was younger     Hemorrhage after delivery of fetus    Hyperglycemia    Hypertension    reports that she takes for diuretic    Hypothyroid    Intractable nausea and vomiting 11/10/2023   Pre-diabetes    S/P colonoscopy 2006   Dr. Mavis    Past Surgical History:  Procedure Laterality Date   APPENDECTOMY     BREAST BIOPSY Right    cataracts Bilateral    CHOLECYSTECTOMY  1998   COLONOSCOPY WITH PROPOFOL  N/A 04/26/2014   Procedure: COLONOSCOPY WITH PROPOFOL ;  Surgeon: Lamar CHRISTELLA Hollingshead, MD;  Location: [facility];  Service: Endoscopy;  Laterality: N/A;  0939 in cecum, total withdrawal time, 10 min   LUMBAR DISC SURGERY  1980's   LUMBAR LAMINECTOMY/DECOMPRESSION MICRODISCECTOMY N/A 05/10/2017   Procedure: L3-4 DECOMPRESSION, RIGHT MICRODISCECTOMY;  Surgeon: Barbarann Oneil BROCKS, MD;   Location: [facility] OR;  Service: Orthopedics;  Laterality: N/A;   TOTAL ABDOMINAL HYSTERECTOMY W/ BILATERAL SALPINGOOPHORECTOMY      Social History   Socioeconomic History   Marital status: Married    Spouse name: Not on file   Number of children: 3   Years of education: Not on file   Highest education level: Not on file  Occupational History   Occupation: retired    Comment: [employer]  Tobacco Use   Smoking status: Never   Smokeless tobacco: Never  Vaping Use   Vaping status: Never Used  Substance and Sexual Activity   Alcohol use: No   Drug use: No   Sexual activity: Never  Other Topics Concern   Not on file  Social History Narrative   Not on file   Social Drivers of Health   Financial Resource Strain: Not on file  Food Insecurity: No Food Insecurity (11/11/2023)   Hunger Vital Sign    Worried About Running Out of Food in the Last Year: Never true    Ran Out of Food in the Last Year: Never true  Transportation Needs: No Transportation Needs (11/11/2023)   PRAPARE - Administrator, Civil Service (Medical): No    Lack of Transportation (Non-Medical): No  Physical Activity: Not on  file  Stress: Not on file  Social Connections: Moderately Isolated (11/11/2023)   Social Connection and Isolation Panel    Frequency of Communication with Friends and Family: Three times a week    Frequency of Social Gatherings with Friends and Family: Three times a week    Attends Religious Services: Patient declined    Active Member of Clubs or Organizations: Yes    Attends Banker Meetings: Patient declined    Marital Status: Widowed  Intimate Partner Violence: Not At Risk (11/11/2023)   Humiliation, Afraid, Rape, and Kick questionnaire    Fear of Current or Ex-Partner: No    Emotionally Abused: No    Physically Abused: No    Sexually Abused: No   Family History  Problem Relation Age of Onset   Brain cancer Father    Coronary artery disease  Mother    Prostate cancer Brother    Cancer Sister        gyn   Lung cancer Brother    Seizures Son    Pancreatitis Daughter        ?gallstones   Colon cancer Neg Hx       VITAL SIGNS BP 130/70   Pulse 78   Temp 97.8 F (36.6 C)   Resp 16   Ht 5' 6 (1.676 m)   Wt 165 lb 9.6 oz (75.1 kg)   SpO2 95%   BMI 26.73 kg/m   Outpatient Encounter Medications as of 11/26/2023  Medication Sig   amLODipine  (NORVASC ) 10 MG tablet amlodipine  10 mg tablet  TAKE ONE TABLET BY MOUTH EVERY DAY   diclofenac Sodium (VOLTAREN ARTHRITIS PAIN) 1 % GEL Apply 4 g topically 2 (two) times daily. Apply to back of neck   DULoxetine  (CYMBALTA ) 60 MG capsule Take 60 mg by mouth daily before breakfast.    levothyroxine  (SYNTHROID , LEVOTHROID) 100 MCG tablet Take 100 mcg by mouth daily before breakfast.   melatonin 5 MG TABS Take 5 mg by mouth at bedtime.   metFORMIN (GLUCOPHAGE-XR) 500 MG 24 hr tablet Take 500 mg by mouth daily.   Multiple Vitamin (MULTIVITAMIN) tablet Take 1 tablet by mouth daily.   Omega-3 Fatty Acids (FISH OIL PO) Take by mouth.   omeprazole (PRILOSEC) 20 MG capsule Take 20 mg by mouth daily.    pregabalin  (LYRICA ) 200 MG capsule Take 1 capsule (200 mg total) by mouth 3 (three) times daily.   senna (SENOKOT) 8.6 MG tablet Take 1 tablet by mouth 2 (two) times daily.   No facility-administered encounter medications on file as of 11/26/2023.     SIGNIFICANT DIAGNOSTIC EXAMS  LABS 11-10-23: wbc 11.3; hgb 15.5; hct 45.0; mcv 95.1 plt 257; glucose 144; bun 19; creat 0.76; k+ 3.4; na++ 138; ca 9.3 gfr >60 protein 8.3 albumin  4.4 ast 127; alt 123  urine culture: no growth 11-13-23: glucose 103; bun 13; creat 0.71; k+ 3.6; na++ 137; ca 8.8; gfr >60; protein 6.7 albumin  3.5   Review of Systems  Constitutional:  Negative for malaise/fatigue.  Respiratory:  Negative for cough and shortness of breath.   Cardiovascular:  Negative for chest pain, palpitations and leg swelling.   Gastrointestinal:  Negative for abdominal pain, constipation and heartburn.  Musculoskeletal:  Negative for back pain, joint pain and myalgias.  Skin: Negative.   Neurological:  Negative for dizziness.  Psychiatric/Behavioral:  The patient is not nervous/anxious.     Physical Exam Constitutional:      General: She is not in acute distress.  Appearance: She is well-developed. She is not diaphoretic.  Neck:     Thyroid : No thyromegaly.  Cardiovascular:     Rate and Rhythm: Normal rate and regular rhythm.     Heart sounds: Normal heart sounds.  Pulmonary:     Effort: Pulmonary effort is normal. No respiratory distress.     Breath sounds: Normal breath sounds.  Abdominal:     General: Bowel sounds are normal. There is no distension.     Palpations: Abdomen is soft.     Tenderness: There is no abdominal tenderness.  Musculoskeletal:        General: Normal range of motion.     Cervical back: Neck supple.     Right lower leg: No edema.     Left lower leg: No edema.  Lymphadenopathy:     Cervical: No cervical adenopathy.  Skin:    General: Skin is warm and dry.  Neurological:     Mental Status: She is alert and oriented to person, place, and time.  Psychiatric:        Mood and Affect: Mood normal.      ASSESSMENT/ PLAN:   Patient is being discharged with the following home health services:  pt/ot to evaluate and treat as indicated for gait balance strength adl training   Patient is being discharged with the following durable medical equipment:  rolling walker   Patient has been advised to f/u with their PCP in 1-2 weeks to for a transitions of care visit.  Social services at their facility was responsible for arranging this appointment.  Pt was provided with adequate prescriptions of noncontrolled medications to reach the scheduled appointment .  For controlled substances, a limited supply was provided as appropriate for the individual patient.  If the pt normally receives  these medications from a pain clinic or has a contract with another physician, these medications should be received from that clinic or physician only).    [facility]  Time spent with patient: 35 minutes: home health; dme; medications.    Barnie Seip NP [facility]   call [phone]

## 2023-11-30 ENCOUNTER — Telehealth: Payer: Self-pay

## 2023-11-30 DIAGNOSIS — Z87440 Personal history of urinary (tract) infections: Secondary | ICD-10-CM | POA: Diagnosis not present

## 2023-11-30 DIAGNOSIS — M797 Fibromyalgia: Secondary | ICD-10-CM | POA: Diagnosis not present

## 2023-11-30 DIAGNOSIS — I152 Hypertension secondary to endocrine disorders: Secondary | ICD-10-CM | POA: Diagnosis not present

## 2023-11-30 DIAGNOSIS — K5900 Constipation, unspecified: Secondary | ICD-10-CM | POA: Diagnosis not present

## 2023-11-30 DIAGNOSIS — Z604 Social exclusion and rejection: Secondary | ICD-10-CM | POA: Diagnosis not present

## 2023-11-30 DIAGNOSIS — E119 Type 2 diabetes mellitus without complications: Secondary | ICD-10-CM | POA: Diagnosis not present

## 2023-11-30 DIAGNOSIS — E785 Hyperlipidemia, unspecified: Secondary | ICD-10-CM | POA: Diagnosis not present

## 2023-11-30 DIAGNOSIS — F5104 Psychophysiologic insomnia: Secondary | ICD-10-CM | POA: Diagnosis not present

## 2023-11-30 DIAGNOSIS — R32 Unspecified urinary incontinence: Secondary | ICD-10-CM | POA: Diagnosis not present

## 2023-11-30 NOTE — Telephone Encounter (Signed)
 Copied from CRM [id number]. Topic: Clinical - Home Health Verbal Orders >> Nov 30, 2023  1:55 PM Alfonso ORN wrote: Caller/Agency: Cara/[facility] Callback Number: [phone] Service Requested: Physical Therapy , got the referral on 11/26/23 will be starting the physical therapy today for evaluation , is out the 48 hour window there was delay  Frequency: N/A Any new concerns about the patient? No

## 2023-11-30 NOTE — Telephone Encounter (Signed)
 Copied from CRM [id number]. Topic: Clinical - Home Health Verbal Orders >> Nov 30, 2023 12:13 PM Susanna ORN wrote: Caller/Agency: Lorie Buyaan, Physical Therapist with [facility] Callback Number: [phone] Service Requested: Physical Therapy Frequency: 1 time a week for one week, twice a week for two weeks, & one time a week for 6 weeks. States she needs orders from Dr. Barnie Seip. Eval has been completed.  Any new concerns about the patient? No

## 2023-11-30 NOTE — Telephone Encounter (Signed)
Message routed to Synthia Innocent, NP

## 2023-12-06 DIAGNOSIS — E785 Hyperlipidemia, unspecified: Secondary | ICD-10-CM | POA: Diagnosis not present

## 2023-12-06 DIAGNOSIS — E119 Type 2 diabetes mellitus without complications: Secondary | ICD-10-CM | POA: Diagnosis not present

## 2023-12-06 DIAGNOSIS — R7303 Prediabetes: Secondary | ICD-10-CM | POA: Diagnosis not present

## 2023-12-06 DIAGNOSIS — E876 Hypokalemia: Secondary | ICD-10-CM | POA: Diagnosis not present

## 2023-12-06 DIAGNOSIS — R5381 Other malaise: Secondary | ICD-10-CM | POA: Diagnosis not present

## 2023-12-06 DIAGNOSIS — Z604 Social exclusion and rejection: Secondary | ICD-10-CM | POA: Diagnosis not present

## 2023-12-06 DIAGNOSIS — K5900 Constipation, unspecified: Secondary | ICD-10-CM | POA: Diagnosis not present

## 2023-12-06 DIAGNOSIS — I10 Essential (primary) hypertension: Secondary | ICD-10-CM | POA: Diagnosis not present

## 2023-12-06 DIAGNOSIS — R7401 Elevation of levels of liver transaminase levels: Secondary | ICD-10-CM | POA: Diagnosis not present

## 2023-12-06 DIAGNOSIS — A084 Viral intestinal infection, unspecified: Secondary | ICD-10-CM | POA: Diagnosis not present

## 2023-12-06 DIAGNOSIS — F5104 Psychophysiologic insomnia: Secondary | ICD-10-CM | POA: Diagnosis not present

## 2023-12-06 DIAGNOSIS — I152 Hypertension secondary to endocrine disorders: Secondary | ICD-10-CM | POA: Diagnosis not present

## 2023-12-06 DIAGNOSIS — R32 Unspecified urinary incontinence: Secondary | ICD-10-CM | POA: Diagnosis not present

## 2023-12-06 DIAGNOSIS — N390 Urinary tract infection, site not specified: Secondary | ICD-10-CM | POA: Diagnosis not present

## 2023-12-06 DIAGNOSIS — Z87440 Personal history of urinary (tract) infections: Secondary | ICD-10-CM | POA: Diagnosis not present

## 2023-12-06 DIAGNOSIS — Z79899 Other long term (current) drug therapy: Secondary | ICD-10-CM | POA: Diagnosis not present

## 2023-12-06 DIAGNOSIS — M797 Fibromyalgia: Secondary | ICD-10-CM | POA: Diagnosis not present

## 2023-12-07 DIAGNOSIS — E785 Hyperlipidemia, unspecified: Secondary | ICD-10-CM | POA: Diagnosis not present

## 2023-12-07 DIAGNOSIS — Z87440 Personal history of urinary (tract) infections: Secondary | ICD-10-CM | POA: Diagnosis not present

## 2023-12-07 DIAGNOSIS — M797 Fibromyalgia: Secondary | ICD-10-CM | POA: Diagnosis not present

## 2023-12-07 DIAGNOSIS — K5900 Constipation, unspecified: Secondary | ICD-10-CM | POA: Diagnosis not present

## 2023-12-07 DIAGNOSIS — F5104 Psychophysiologic insomnia: Secondary | ICD-10-CM | POA: Diagnosis not present

## 2023-12-07 DIAGNOSIS — E119 Type 2 diabetes mellitus without complications: Secondary | ICD-10-CM | POA: Diagnosis not present

## 2023-12-07 DIAGNOSIS — R32 Unspecified urinary incontinence: Secondary | ICD-10-CM | POA: Diagnosis not present

## 2023-12-07 DIAGNOSIS — I152 Hypertension secondary to endocrine disorders: Secondary | ICD-10-CM | POA: Diagnosis not present

## 2023-12-07 DIAGNOSIS — Z604 Social exclusion and rejection: Secondary | ICD-10-CM | POA: Diagnosis not present

## 2023-12-08 DIAGNOSIS — E119 Type 2 diabetes mellitus without complications: Secondary | ICD-10-CM | POA: Diagnosis not present

## 2023-12-08 DIAGNOSIS — I152 Hypertension secondary to endocrine disorders: Secondary | ICD-10-CM | POA: Diagnosis not present

## 2023-12-08 DIAGNOSIS — Z87440 Personal history of urinary (tract) infections: Secondary | ICD-10-CM | POA: Diagnosis not present

## 2023-12-08 DIAGNOSIS — F5104 Psychophysiologic insomnia: Secondary | ICD-10-CM | POA: Diagnosis not present

## 2023-12-08 DIAGNOSIS — K5900 Constipation, unspecified: Secondary | ICD-10-CM | POA: Diagnosis not present

## 2023-12-08 DIAGNOSIS — E785 Hyperlipidemia, unspecified: Secondary | ICD-10-CM | POA: Diagnosis not present

## 2023-12-08 DIAGNOSIS — M797 Fibromyalgia: Secondary | ICD-10-CM | POA: Diagnosis not present

## 2023-12-08 DIAGNOSIS — Z604 Social exclusion and rejection: Secondary | ICD-10-CM | POA: Diagnosis not present

## 2023-12-08 DIAGNOSIS — R32 Unspecified urinary incontinence: Secondary | ICD-10-CM | POA: Diagnosis not present

## 2023-12-15 DIAGNOSIS — Z604 Social exclusion and rejection: Secondary | ICD-10-CM | POA: Diagnosis not present

## 2023-12-15 DIAGNOSIS — E785 Hyperlipidemia, unspecified: Secondary | ICD-10-CM | POA: Diagnosis not present

## 2023-12-15 DIAGNOSIS — Z87440 Personal history of urinary (tract) infections: Secondary | ICD-10-CM | POA: Diagnosis not present

## 2023-12-15 DIAGNOSIS — F5104 Psychophysiologic insomnia: Secondary | ICD-10-CM | POA: Diagnosis not present

## 2023-12-15 DIAGNOSIS — R32 Unspecified urinary incontinence: Secondary | ICD-10-CM | POA: Diagnosis not present

## 2023-12-15 DIAGNOSIS — E119 Type 2 diabetes mellitus without complications: Secondary | ICD-10-CM | POA: Diagnosis not present

## 2023-12-15 DIAGNOSIS — I152 Hypertension secondary to endocrine disorders: Secondary | ICD-10-CM | POA: Diagnosis not present

## 2023-12-15 DIAGNOSIS — M797 Fibromyalgia: Secondary | ICD-10-CM | POA: Diagnosis not present

## 2023-12-15 DIAGNOSIS — K5900 Constipation, unspecified: Secondary | ICD-10-CM | POA: Diagnosis not present

## 2023-12-20 DIAGNOSIS — E119 Type 2 diabetes mellitus without complications: Secondary | ICD-10-CM | POA: Diagnosis not present

## 2023-12-20 DIAGNOSIS — E785 Hyperlipidemia, unspecified: Secondary | ICD-10-CM | POA: Diagnosis not present

## 2023-12-20 DIAGNOSIS — K5900 Constipation, unspecified: Secondary | ICD-10-CM | POA: Diagnosis not present

## 2023-12-20 DIAGNOSIS — F5104 Psychophysiologic insomnia: Secondary | ICD-10-CM | POA: Diagnosis not present

## 2023-12-20 DIAGNOSIS — I152 Hypertension secondary to endocrine disorders: Secondary | ICD-10-CM | POA: Diagnosis not present

## 2023-12-20 DIAGNOSIS — R32 Unspecified urinary incontinence: Secondary | ICD-10-CM | POA: Diagnosis not present

## 2023-12-20 DIAGNOSIS — Z604 Social exclusion and rejection: Secondary | ICD-10-CM | POA: Diagnosis not present

## 2023-12-20 DIAGNOSIS — Z87440 Personal history of urinary (tract) infections: Secondary | ICD-10-CM | POA: Diagnosis not present

## 2023-12-20 DIAGNOSIS — M797 Fibromyalgia: Secondary | ICD-10-CM | POA: Diagnosis not present

## 2024-01-11 ENCOUNTER — Other Ambulatory Visit: Payer: Self-pay | Admitting: Medical Genetics

## 2024-01-11 DIAGNOSIS — Z006 Encounter for examination for normal comparison and control in clinical research program: Secondary | ICD-10-CM

## 2024-02-03 ENCOUNTER — Emergency Department (HOSPITAL_COMMUNITY)
Admission: EM | Admit: 2024-02-03 | Discharge: 2024-02-03 | Disposition: A | Discharge status: Home / Self Care | Attending: Emergency Medicine | Admitting: Emergency Medicine

## 2024-02-03 ENCOUNTER — Emergency Department (HOSPITAL_COMMUNITY)

## 2024-02-03 DIAGNOSIS — R509 Fever, unspecified: Secondary | ICD-10-CM | POA: Diagnosis present

## 2024-02-03 DIAGNOSIS — R410 Disorientation, unspecified: Secondary | ICD-10-CM | POA: Diagnosis not present

## 2024-02-03 DIAGNOSIS — D72829 Elevated white blood cell count, unspecified: Secondary | ICD-10-CM | POA: Insufficient documentation

## 2024-02-03 DIAGNOSIS — N3000 Acute cystitis without hematuria: Secondary | ICD-10-CM | POA: Insufficient documentation

## 2024-02-03 LAB — CBC
HCT: 43.1 % (ref 36.0–46.0)
Hemoglobin: 14.4 g/dL (ref 12.0–15.0)
MCH: 32.1 pg (ref 26.0–34.0)
MCHC: 33.4 g/dL (ref 30.0–36.0)
MCV: 96 fL (ref 80.0–100.0)
Platelets: 211 K/uL (ref 150–400)
RBC: 4.49 MIL/uL (ref 3.87–5.11)
RDW: 13.6 % (ref 11.5–15.5)
WBC: 10.9 K/uL — ABNORMAL HIGH (ref 4.0–10.5)
nRBC: 0 % (ref 0.0–0.2)

## 2024-02-03 LAB — CBG MONITORING, ED: Glucose-Capillary: 102 mg/dL — ABNORMAL HIGH (ref 70–99)

## 2024-02-03 LAB — COMPREHENSIVE METABOLIC PANEL WITH GFR
ALT: 55 U/L — ABNORMAL HIGH (ref 0–44)
AST: 46 U/L — ABNORMAL HIGH (ref 15–41)
Albumin: 4.6 g/dL (ref 3.5–5.0)
Alkaline Phosphatase: 90 U/L (ref 38–126)
Anion gap: 12 (ref 5–15)
BUN: 31 mg/dL — ABNORMAL HIGH (ref 8–23)
CO2: 26 mmol/L (ref 22–32)
Calcium: 10.1 mg/dL (ref 8.9–10.3)
Chloride: 100 mmol/L (ref 98–111)
Creatinine, Ser: 0.72 mg/dL (ref 0.44–1.00)
GFR, Estimated: >60 mL/min (ref >60)
Glucose, Bld: 97 mg/dL (ref 70–99)
Potassium: 3.2 mmol/L — ABNORMAL LOW (ref 3.5–5.1)
Sodium: 138 mmol/L (ref 135–145)
Total Bilirubin: 0.8 mg/dL (ref 0.0–1.2)
Total Protein: 8.2 g/dL — ABNORMAL HIGH (ref 6.5–8.1)

## 2024-02-03 LAB — URINALYSIS, ROUTINE W REFLEX MICROSCOPIC
Bilirubin Urine: NEGATIVE
Glucose, UA: NEGATIVE mg/dL
Hgb urine dipstick: NEGATIVE
Ketones, ur: 5 mg/dL — AB
Nitrite: POSITIVE — AB
Protein, ur: 100 mg/dL — AB
Specific Gravity, Urine: 1.021 (ref 1.005–1.030)
pH: 6 (ref 5.0–8.0)

## 2024-02-03 MED ORDER — FOSFOMYCIN TROMETHAMINE 3 G PO PACK
3.0000 g | PACK | Freq: Once | ORAL | Status: AC
  Administered 2024-02-03: 3 g via ORAL
  Filled 2024-02-03: qty 3

## 2024-02-03 MED ORDER — LACTATED RINGERS IV BOLUS
1000.0000 mL | Freq: Once | INTRAVENOUS | Status: AC
  Administered 2024-02-03: 1000 mL via INTRAVENOUS

## 2024-02-03 MED ORDER — POTASSIUM CHLORIDE CRYS ER 20 MEQ PO TBCR
40.0000 meq | EXTENDED_RELEASE_TABLET | Freq: Once | ORAL | Status: AC
  Administered 2024-02-03: 40 meq via ORAL
  Filled 2024-02-03: qty 2

## 2024-02-03 MED ORDER — SODIUM CHLORIDE 0.9 % IV SOLN: 1.0000 g | Freq: Once | INTRAVENOUS | Status: DC

## 2024-02-03 NOTE — ED Notes (Signed)
 Contacted [facility] for transport of Pt

## 2024-02-03 NOTE — Discharge Instructions (Signed)
 [name] was seen in the ER today for confusion. She was found to have a UTI and was given fosfomycin. No further antibiotics are required. If she becomes increasingly confused, begins having trouble walking or talking, or acting out of character, return to the ER.

## 2024-02-03 NOTE — ED Triage Notes (Signed)
 Pt BIB EMS from [facility]. staff c/o fever, AMS. Pt currently AO4 for EMS. Staff says she is not her normal self since Sunday. Has had loss of appetite.    EMS vitals  BP 160/80 HR 80 SPO2 97 RA  30 capnography  CBG 121 Temp 97.2

## 2024-02-03 NOTE — ED Provider Notes (Signed)
 [place] EMERGENCY DEPARTMENT AT [facility] Provider Note   CSN: [id number] Arrival date & time: 02/03/24  8261     Patient presents with: No chief complaint on file.   [name] is a 83 y.o. female who presents from the nursing home for evaluation of confusion over the last few days. The patient is here alone and provides the history. She reports that she has noticed a decline in her function and that she feels foggy. She is able to recall events and conversations and answers orientation questions appropriately. She reports that she has felt dizzy, lightheaded, and nauseous today. She denies any other symptoms including CP, SOB, cough, congestion, abdominal pain, diarrhea, vomiting, dysuria, frequency, or incontinence. Towards the end of our conversation, she did ask if I saw the man walking in laps around the ED and that the man's daughter was her niece. It was verified with the nursing staff that no one else has seen this man.       Prior to Admission medications   Medication Sig Start Date End Date Taking? Authorizing Provider  amLODipine  (NORVASC ) 10 MG tablet amlodipine  10 mg tablet  TAKE ONE TABLET BY MOUTH EVERY DAY    [provider]  diclofenac Sodium (VOLTAREN ARTHRITIS PAIN) 1 % GEL Apply 4 g topically 2 (two) times daily. Apply to back of neck    [provider]  DULoxetine  (CYMBALTA ) 60 MG capsule Take 60 mg by mouth daily before breakfast.     [provider]  levothyroxine  (SYNTHROID , LEVOTHROID) 100 MCG tablet Take 100 mcg by mouth daily before breakfast.    [provider]  melatonin 5 MG TABS Take 5 mg by mouth at bedtime.    [provider]  metFORMIN (GLUCOPHAGE-XR) 500 MG 24 hr tablet Take 500 mg by mouth daily. 08/30/23   [provider]  Multiple Vitamin (MULTIVITAMIN) tablet Take 1 tablet by mouth daily.    [provider]  Omega-3 Fatty Acids (FISH OIL PO) Take by mouth.    [provider]  omeprazole (PRILOSEC) 20 MG capsule Take 20 mg by mouth daily.     [provider]  pregabalin  (LYRICA ) 200 MG capsule Take 1 capsule (200 mg total) by mouth 3 (three) times daily. 11/15/23   Landy Barnie RAMAN, NP  senna (SENOKOT) 8.6 MG tablet Take 1 tablet by mouth 2 (two) times daily.    [provider]    Allergies: Sulfonamide derivatives and Adhesive [tape]    Review of Systems  Constitutional:  Positive for appetite change. Negative for activity change and fever.  HENT:  Negative for congestion, drooling, ear pain, facial swelling, rhinorrhea, sinus pressure and sinus pain.   Respiratory:  Negative for cough, chest tightness and shortness of breath.   Cardiovascular:  Negative for chest pain.  Gastrointestinal:  Negative for abdominal distention, abdominal pain, blood in stool, diarrhea, nausea and vomiting.  Genitourinary:  Negative for decreased urine volume, difficulty urinating, dysuria, flank pain, hematuria and pelvic pain.  Neurological:  Positive for dizziness, tremors and headaches. Negative for weakness and numbness.       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    Appearance: Normal appearance. She is normal weight.  HENT:     Head: Normocephalic and atraumatic.  Cardiovascular:     Rate and Rhythm: Normal rate and regular rhythm.     Pulses: Normal pulses.  Pulmonary:     Effort: Pulmonary effort is normal. No respiratory distress.     Breath sounds: No wheezing or rales.  Chest:     Chest wall: No tenderness.  Abdominal:     General: Abdomen is flat. There is no distension.     Palpations: Abdomen is soft.     Tenderness: There is no abdominal tenderness. There is no guarding.  Skin:    General:  Skin is warm and dry.  Neurological:     General: No focal deficit present.     Mental Status: She is alert.  Psychiatric:     Comments: Hallucinations noted     (all labs ordered are listed, but only abnormal results are displayed) Labs Reviewed  COMPREHENSIVE METABOLIC PANEL WITH GFR - Abnormal; Notable for the following components:      Result Value   Potassium 3.2 (*)    BUN 31 (*)    Total Protein 8.2 (*)    AST 46 (*)    ALT 55 (*)    All other components within normal limits  CBC - Abnormal; Notable for the following components:   WBC 10.9 (*)    All other components within normal limits  URINALYSIS, ROUTINE W REFLEX MICROSCOPIC - Abnormal; Notable for the following components:   APPearance HAZY (*)    Ketones, ur 5 (*)    Protein, ur 100 (*)    Nitrite POSITIVE (*)    Leukocytes,Ua TRACE (*)    Bacteria, UA MANY (*)    All other components within normal limits  CBG MONITORING, ED - Abnormal; Notable for the following components:   Glucose-Capillary 102 (*)    All other components within normal limits    EKG: None  Radiology: CT HEAD WO CONTRAST Result Date: 02/03/2024 EXAM: CT HEAD WITHOUT CONTRAST 02/03/2024 07:24:50 PM TECHNIQUE: CT of the head was performed without the administration of intravenous contrast. Automated exposure control, iterative reconstruction, and/or weight based adjustment of the mA/kV was utilized to reduce the radiation dose to as low as reasonably achievable. COMPARISON: 7 / 4 / 17 and 7 / 5 / 17 CLINICAL HISTORY: Psychosis FINDINGS: BRAIN AND VENTRICLES: No acute hemorrhage. No evidence of acute infarct. Patchy to confluent white matter hypodensities compatible with advanced chronic small vessel ischemic disease. Age-appropriate mild cerebral atrophy. No hydrocephalus. No extra-axial collection. No mass effect or midline shift. Calcified atherosclerosis at the skull base. ORBITS: Bilateral cataract extraction. SINUSES: No acute abnormality.  SOFT TISSUES AND SKULL: No acute soft tissue abnormality. No skull fracture. IMPRESSION: 1. No acute intracranial abnormality. 2. Advanced chronic small vessel ischemic disease . Electronically signed by: Norman Gatlin MD 02/03/2024 07:31 PM EST RP Workstation: [id number]    Medications Ordered in the ED  fosfomycin (MONUROL) packet 3 g (has no administration in time range)  lactated ringers  bolus 1,000 mL (1,000 mLs Intravenous New Bag/Given 02/03/24 2007)     #Confusion The patient presented from her facility for evaluation of confusion. She is alert and oriented x4 on exam, however does ask about a man walking around that is not there. She answers all questions appropriately and is oriented to the conversation. Will obtain CT head, CBC, CMP, and Urinalysis  to rule our causes of acute confusion Urinalysis with nitrites, leukocytes, and bacteria. CBC with slightly elevated white count at 10.9. Will treat with fosfomycin and discharge patient back to facility.  Upon re-evaluation, the patient is resting comfortably on her phone and remains alert and oriented. At this time, the patient is not exhibiting s/s of encephalopathy is stable to return to the nursing facility.    Final diagnoses:  Acute cystitis without hematuria    ED Discharge Orders     None          Myrna Bitters, DO 02/03/24 2124    Garrick Charleston, MD 02/04/24 2051
